# Patient Record
Sex: Female | Born: 1955
Health system: Southern US, Community
[De-identification: ages and names within clinical notes are randomized; demographics above are authoritative.]

## PROBLEM LIST (undated history)

## (undated) DIAGNOSIS — Z21 Asymptomatic human immunodeficiency virus [HIV] infection status: Secondary | ICD-10-CM

## (undated) DIAGNOSIS — K859 Acute pancreatitis without necrosis or infection, unspecified: Secondary | ICD-10-CM

## (undated) DIAGNOSIS — K5792 Diverticulitis of intestine, part unspecified, without perforation or abscess without bleeding: Secondary | ICD-10-CM

## (undated) DIAGNOSIS — F0390 Unspecified dementia without behavioral disturbance: Secondary | ICD-10-CM

## (undated) DIAGNOSIS — I639 Cerebral infarction, unspecified: Secondary | ICD-10-CM

## (undated) DIAGNOSIS — B20 Human immunodeficiency virus [HIV] disease: Secondary | ICD-10-CM

## (undated) DIAGNOSIS — B182 Chronic viral hepatitis C: Secondary | ICD-10-CM

## (undated) DIAGNOSIS — I10 Essential (primary) hypertension: Secondary | ICD-10-CM

## (undated) HISTORY — DX: Chronic viral hepatitis C: B18.2

## (undated) HISTORY — DX: Human immunodeficiency virus (HIV) disease: B20

## (undated) HISTORY — DX: Asymptomatic human immunodeficiency virus (hiv) infection status: Z21

---

## 2000-01-01 ENCOUNTER — Emergency Department (HOSPITAL_COMMUNITY): Admission: EM | Admit: 2000-01-01 | Discharge: 2000-01-01 | Payer: Self-pay | Admitting: Emergency Medicine

## 2000-01-01 ENCOUNTER — Encounter: Payer: Self-pay | Admitting: Emergency Medicine

## 2000-09-21 ENCOUNTER — Encounter: Payer: Self-pay | Admitting: Emergency Medicine

## 2000-09-21 ENCOUNTER — Emergency Department (HOSPITAL_COMMUNITY): Admission: EM | Admit: 2000-09-21 | Discharge: 2000-09-21 | Payer: Self-pay | Admitting: Emergency Medicine

## 2000-10-10 ENCOUNTER — Emergency Department (HOSPITAL_COMMUNITY): Admission: EM | Admit: 2000-10-10 | Discharge: 2000-10-10 | Payer: Self-pay | Admitting: Emergency Medicine

## 2001-08-24 ENCOUNTER — Emergency Department (HOSPITAL_COMMUNITY): Admission: EM | Admit: 2001-08-24 | Discharge: 2001-08-24 | Payer: Self-pay | Admitting: Emergency Medicine

## 2001-08-24 ENCOUNTER — Encounter: Payer: Self-pay | Admitting: Emergency Medicine

## 2002-02-22 ENCOUNTER — Emergency Department (HOSPITAL_COMMUNITY): Admission: EM | Admit: 2002-02-22 | Discharge: 2002-02-22 | Payer: Self-pay | Admitting: Emergency Medicine

## 2002-03-03 ENCOUNTER — Emergency Department (HOSPITAL_COMMUNITY): Admission: EM | Admit: 2002-03-03 | Discharge: 2002-03-03 | Payer: Self-pay

## 2002-03-12 ENCOUNTER — Emergency Department (HOSPITAL_COMMUNITY): Admission: EM | Admit: 2002-03-12 | Discharge: 2002-03-12 | Payer: Self-pay | Admitting: Emergency Medicine

## 2004-11-29 ENCOUNTER — Emergency Department (HOSPITAL_COMMUNITY): Admission: EM | Admit: 2004-11-29 | Discharge: 2004-11-29 | Payer: Self-pay | Admitting: Emergency Medicine

## 2006-06-15 ENCOUNTER — Emergency Department (HOSPITAL_COMMUNITY): Admission: EM | Admit: 2006-06-15 | Discharge: 2006-06-16 | Payer: Self-pay | Admitting: Emergency Medicine

## 2012-04-19 ENCOUNTER — Encounter (HOSPITAL_COMMUNITY): Payer: Self-pay | Admitting: Emergency Medicine

## 2012-04-19 ENCOUNTER — Emergency Department (HOSPITAL_COMMUNITY)
Admission: EM | Admit: 2012-04-19 | Discharge: 2012-04-20 | Disposition: A | Discharge status: Home / Self Care | Payer: Self-pay | Attending: Emergency Medicine | Admitting: Emergency Medicine

## 2012-04-19 DIAGNOSIS — F17200 Nicotine dependence, unspecified, uncomplicated: Secondary | ICD-10-CM | POA: Insufficient documentation

## 2012-04-19 DIAGNOSIS — R10816 Epigastric abdominal tenderness: Secondary | ICD-10-CM | POA: Insufficient documentation

## 2012-04-19 DIAGNOSIS — R110 Nausea: Secondary | ICD-10-CM | POA: Insufficient documentation

## 2012-04-19 DIAGNOSIS — R109 Unspecified abdominal pain: Secondary | ICD-10-CM | POA: Insufficient documentation

## 2012-04-19 DIAGNOSIS — K859 Acute pancreatitis without necrosis or infection, unspecified: Secondary | ICD-10-CM | POA: Insufficient documentation

## 2012-04-19 HISTORY — DX: Acute pancreatitis without necrosis or infection, unspecified: K85.90

## 2012-04-19 LAB — URINALYSIS, ROUTINE W REFLEX MICROSCOPIC
Bilirubin Urine: NEGATIVE
Glucose, UA: NEGATIVE mg/dL
Hgb urine dipstick: NEGATIVE
Ketones, ur: NEGATIVE mg/dL
Nitrite: NEGATIVE
Protein, ur: NEGATIVE mg/dL
Specific Gravity, Urine: 1.016 (ref 1.005–1.030)
Urobilinogen, UA: 0.2 mg/dL (ref 0.0–1.0)
pH: 5.5 (ref 5.0–8.0)

## 2012-04-19 LAB — COMPREHENSIVE METABOLIC PANEL
ALT: 22 U/L (ref 0–35)
AST: 21 U/L (ref 0–37)
Albumin: 3.9 g/dL (ref 3.5–5.2)
Alkaline Phosphatase: 61 U/L (ref 39–117)
BUN: 6 mg/dL (ref 6–23)
CO2: 24 mEq/L (ref 19–32)
Calcium: 10 mg/dL (ref 8.4–10.5)
Chloride: 102 mEq/L (ref 96–112)
Creatinine, Ser: 0.69 mg/dL (ref 0.50–1.10)
GFR calc Af Amer: >90 mL/min (ref >90)
GFR calc non Af Amer: >90 mL/min (ref >90)
Glucose, Bld: 131 mg/dL — ABNORMAL HIGH (ref 70–99)
Potassium: 3.9 mEq/L (ref 3.5–5.1)
Sodium: 138 mEq/L (ref 135–145)
Total Bilirubin: 0.2 mg/dL — ABNORMAL LOW (ref 0.3–1.2)
Total Protein: 7.5 g/dL (ref 6.0–8.3)

## 2012-04-19 LAB — CBC
HCT: 39 % (ref 36.0–46.0)
Hemoglobin: 13.5 g/dL (ref 12.0–15.0)
MCH: 32.8 pg (ref 26.0–34.0)
MCHC: 34.6 g/dL (ref 30.0–36.0)
MCV: 94.9 fL (ref 78.0–100.0)
Platelets: 204 10*3/uL (ref 150–400)
RBC: 4.11 MIL/uL (ref 3.87–5.11)
RDW: 13.6 % (ref 11.5–15.5)
WBC: 7.5 10*3/uL (ref 4.0–10.5)

## 2012-04-19 LAB — URINE MICROSCOPIC-ADD ON

## 2012-04-19 MED ORDER — ONDANSETRON HCL 4 MG/2ML IJ SOLN
4.0000 mg | Freq: Once | INTRAMUSCULAR | Status: AC
  Administered 2012-04-20: 4 mg via INTRAVENOUS
  Filled 2012-04-19: qty 2

## 2012-04-19 MED ORDER — MORPHINE SULFATE 4 MG/ML IJ SOLN
6.0000 mg | Freq: Once | INTRAMUSCULAR | Status: AC
  Administered 2012-04-20: 6 mg via INTRAVENOUS
  Filled 2012-04-19 (×2): qty 1

## 2012-04-19 MED ORDER — SODIUM CHLORIDE 0.9 % IV BOLUS (SEPSIS)
1000.0000 mL | Freq: Once | INTRAVENOUS | Status: AC
Start: 2012-04-19 — End: 2012-04-20
  Administered 2012-04-20: 1000 mL via INTRAVENOUS

## 2012-04-19 MED ORDER — GI COCKTAIL ~~LOC~~
30.0000 mL | Freq: Once | ORAL | Status: AC
  Administered 2012-04-20: 30 mL via ORAL
  Filled 2012-04-19: qty 30

## 2012-04-19 NOTE — ED Notes (Signed)
C/o nausea and upper abd pain that radiates to back (both sides) since Tuesday.

## 2012-04-19 NOTE — ED Notes (Signed)
Pt states she has a history of pancreatitis. Admits to drinking 6 beers a day. Pt states pain feels similar to pancreatitis pain. Having symptoms since Tuesday.

## 2012-04-20 LAB — LIPASE, BLOOD: Lipase: 87 U/L — ABNORMAL HIGH (ref 11–59)

## 2012-04-20 MED ORDER — FAMOTIDINE 20 MG PO TABS: 20.0000 mg | ORAL_TABLET | Freq: Two times a day (BID) | ORAL | Status: DC

## 2012-04-20 MED ORDER — HYDROCODONE-ACETAMINOPHEN 5-325 MG PO TABS: 1.0000 | ORAL_TABLET | Freq: Four times a day (QID) | ORAL | Status: AC | PRN

## 2012-04-20 MED ORDER — PROMETHAZINE HCL 25 MG PO TABS: 25.0000 mg | ORAL_TABLET | Freq: Four times a day (QID) | ORAL | Status: DC | PRN

## 2012-04-20 MED ORDER — METRONIDAZOLE 500 MG PO TABS
2000.0000 mg | ORAL_TABLET | Freq: Once | ORAL | Status: AC
  Administered 2012-04-20: 2000 mg via ORAL
  Filled 2012-04-20: qty 1

## 2012-04-20 NOTE — ED Provider Notes (Signed)
History     CSN: [id number]  Arrival date & time 04/19/12  1940   First MD Initiated Contact with Patient 04/19/12 2137      Chief Complaint  Patient presents with  . Abdominal Pain    (Consider location/radiation/quality/duration/timing/severity/associated sxs/prior treatment) HPI Patient presents emergency Dept. with upper abdominal pain, and nausea since Tuesday.  Patient, states it hurts in the mid upper part of her abdomen.  There is radiation to both sides of her abdomen.  Patient denies chest pain, shortness of breath, vomiting, diarrhea, weakness, dysuria, fever, or syncope.  Patient, states that she has not tried anything at home for her discomfort.  Patient, states that she has had some nausea, but this is intermittent.  Patient, states the palpation seems to make the pain, worse.  Patient is a heavy drinker, states that she drinks 6 beers or more each day. Past Medical History  Diagnosis Date  . Pancreatitis     History reviewed. No pertinent past surgical history.  No family history on file.  History  Substance Use Topics  . Smoking status: Current Everyday Smoker  . Smokeless tobacco: Not on file  . Alcohol Use: Yes    OB History    Grav Para Term Preterm Abortions TAB SAB Ect Mult Living                  Review of Systems All other systems negative except as documented in the HPI. All pertinent positives and negatives as reviewed in the HPI.  Allergies  Review of patient's allergies indicates no known allergies.  Home Medications  No current outpatient prescriptions on file.  BP 196/79  Pulse 73  Temp 98.8 F (37.1 C) (Oral)  Resp 18  SpO2 100%  Physical Exam  Constitutional: She is oriented to person, place, and time. She appears well-developed and well-nourished. No distress.  HENT:  Head: Normocephalic and atraumatic.  Mouth/Throat: Oropharynx is clear and moist.  Cardiovascular: Normal rate and regular rhythm.  Exam reveals no gallop and no  friction rub.   No murmur heard. Pulmonary/Chest: Effort normal and breath sounds normal. No respiratory distress.  Abdominal: Soft. Normal appearance and bowel sounds are normal. She exhibits no distension, no ascites and no mass. There is tenderness in the epigastric area. There is no rigidity, no rebound and no guarding.    Neurological: She is alert and oriented to person, place, and time.  Skin: Skin is warm and dry.    ED Course  Procedures (including critical care time)  Labs Reviewed  URINALYSIS, ROUTINE W REFLEX MICROSCOPIC - Abnormal; Notable for the following:    Leukocytes, UA SMALL (*)     All other components within normal limits  COMPREHENSIVE METABOLIC PANEL - Abnormal; Notable for the following:    Glucose, Bld 131 (*)     Total Bilirubin 0.2 (*)     All other components within normal limits  LIPASE, BLOOD - Abnormal; Notable for the following:    Lipase 87 (*)     All other components within normal limits  CBC  URINE MICROSCOPIC-ADD ON   Patient has no right upper quadrant pain.  She states that her pain is dead center of her epigastrium.  Patient has had no vomiting.  No fever.  Patient's been stable here in the emergency department.  Patient be treated for pancreatitis and will be return here as needed.  Patient will be put on a clear liquid diet for the next 48 hours. The  patients pain is specific to this one area. She is advised to return here for any worsening in her condition.   MDM  MDM Reviewed: nursing note and vitals Reviewed previous: labs Interpretation: labs            Carlyle Dolly, PA-C 04/20/12 0109

## 2012-04-21 NOTE — ED Provider Notes (Signed)
Medical screening examination/treatment/procedure(s) were performed by non-physician practitioner and as supervising physician I was immediately available for consultation/collaboration.    Vida Roller, MD 04/21/12 [phone]

## 2013-02-24 ENCOUNTER — Encounter (HOSPITAL_COMMUNITY): Payer: Self-pay | Admitting: Emergency Medicine

## 2013-02-24 ENCOUNTER — Emergency Department (HOSPITAL_COMMUNITY): Payer: Self-pay

## 2013-02-24 ENCOUNTER — Inpatient Hospital Stay (HOSPITAL_COMMUNITY)
Admission: EM | Admit: 2013-02-24 | Discharge: 2013-02-26 | DRG: 392 | Disposition: A | Discharge status: Home / Self Care | Payer: MEDICAID | Attending: Internal Medicine | Admitting: Internal Medicine

## 2013-02-24 DIAGNOSIS — F1010 Alcohol abuse, uncomplicated: Secondary | ICD-10-CM | POA: Diagnosis present

## 2013-02-24 DIAGNOSIS — F17200 Nicotine dependence, unspecified, uncomplicated: Secondary | ICD-10-CM | POA: Diagnosis present

## 2013-02-24 DIAGNOSIS — K5900 Constipation, unspecified: Secondary | ICD-10-CM | POA: Diagnosis present

## 2013-02-24 DIAGNOSIS — F1210 Cannabis abuse, uncomplicated: Secondary | ICD-10-CM | POA: Diagnosis present

## 2013-02-24 DIAGNOSIS — D7289 Other specified disorders of white blood cells: Secondary | ICD-10-CM | POA: Diagnosis present

## 2013-02-24 DIAGNOSIS — K863 Pseudocyst of pancreas: Secondary | ICD-10-CM | POA: Diagnosis present

## 2013-02-24 DIAGNOSIS — K5792 Diverticulitis of intestine, part unspecified, without perforation or abscess without bleeding: Secondary | ICD-10-CM

## 2013-02-24 DIAGNOSIS — K859 Acute pancreatitis without necrosis or infection, unspecified: Secondary | ICD-10-CM

## 2013-02-24 DIAGNOSIS — K5732 Diverticulitis of large intestine without perforation or abscess without bleeding: Principal | ICD-10-CM

## 2013-02-24 DIAGNOSIS — I10 Essential (primary) hypertension: Secondary | ICD-10-CM

## 2013-02-24 DIAGNOSIS — K862 Cyst of pancreas: Secondary | ICD-10-CM | POA: Diagnosis present

## 2013-02-24 HISTORY — DX: Essential (primary) hypertension: I10

## 2013-02-24 LAB — COMPREHENSIVE METABOLIC PANEL
ALT: 27 U/L (ref 0–35)
Alkaline Phosphatase: 69 U/L (ref 39–117)
BUN: 8 mg/dL (ref 6–23)
Chloride: 104 mEq/L (ref 96–112)
GFR calc Af Amer: >90 mL/min (ref >90)
GFR calc non Af Amer: >90 mL/min (ref >90)
Total Bilirubin: 0.4 mg/dL (ref 0.3–1.2)
Total Protein: 8.1 g/dL (ref 6.0–8.3)

## 2013-02-24 LAB — URINALYSIS, ROUTINE W REFLEX MICROSCOPIC
Hgb urine dipstick: NEGATIVE
Nitrite: NEGATIVE
Protein, ur: NEGATIVE mg/dL
Specific Gravity, Urine: 1.024 (ref 1.005–1.030)
Urobilinogen, UA: 0.2 mg/dL (ref 0.0–1.0)

## 2013-02-24 LAB — CBC WITH DIFFERENTIAL/PLATELET
Basophils Absolute: 0 10*3/uL (ref 0.0–0.1)
Basophils Relative: 0 % (ref 0–1)
Lymphocytes Relative: 16 % (ref 12–46)
MCHC: 35.7 g/dL (ref 30.0–36.0)
Monocytes Absolute: 0.7 10*3/uL (ref 0.1–1.0)
Neutro Abs: 9.1 10*3/uL — ABNORMAL HIGH (ref 1.7–7.7)
Neutrophils Relative %: 78 % — ABNORMAL HIGH (ref 43–77)
Platelets: 225 10*3/uL (ref 150–400)
RDW: 13.6 % (ref 11.5–15.5)
WBC: 11.7 10*3/uL — ABNORMAL HIGH (ref 4.0–10.5)

## 2013-02-24 LAB — PREGNANCY, URINE: Preg Test, Ur: NEGATIVE

## 2013-02-24 LAB — LIPASE, BLOOD: Lipase: 20 U/L (ref 11–59)

## 2013-02-24 MED ORDER — ONDANSETRON HCL 4 MG/2ML IJ SOLN: 4.0000 mg | Freq: Four times a day (QID) | INTRAMUSCULAR | Status: DC | PRN

## 2013-02-24 MED ORDER — ENOXAPARIN SODIUM 40 MG/0.4ML ~~LOC~~ SOLN
40.0000 mg | SUBCUTANEOUS | Status: DC
  Administered 2013-02-24 – 2013-02-25 (×2): 40 mg via SUBCUTANEOUS
  Filled 2013-02-24 (×4): qty 0.4

## 2013-02-24 MED ORDER — SODIUM CHLORIDE 0.9 % IJ SOLN: 3.0000 mL | INTRAMUSCULAR | Status: DC | PRN

## 2013-02-24 MED ORDER — FOLIC ACID 1 MG PO TABS
1.0000 mg | ORAL_TABLET | Freq: Every day | ORAL | Status: DC
  Administered 2013-02-24 – 2013-02-26 (×3): 1 mg via ORAL
  Filled 2013-02-24 (×4): qty 1

## 2013-02-24 MED ORDER — ONDANSETRON HCL 4 MG PO TABS: 4.0000 mg | ORAL_TABLET | Freq: Four times a day (QID) | ORAL | Status: DC | PRN

## 2013-02-24 MED ORDER — THIAMINE HCL 100 MG/ML IJ SOLN
100.0000 mg | Freq: Every day | INTRAMUSCULAR | Status: DC
  Filled 2013-02-24 (×2): qty 1

## 2013-02-24 MED ORDER — METRONIDAZOLE IN NACL 5-0.79 MG/ML-% IV SOLN
500.0000 mg | Freq: Once | INTRAVENOUS | Status: AC
  Administered 2013-02-24: 500 mg via INTRAVENOUS
  Filled 2013-02-24: qty 100

## 2013-02-24 MED ORDER — CIPROFLOXACIN IN D5W 400 MG/200ML IV SOLN
400.0000 mg | Freq: Two times a day (BID) | INTRAVENOUS | Status: DC
  Administered 2013-02-25 – 2013-02-26 (×3): 400 mg via INTRAVENOUS
  Filled 2013-02-24 (×4): qty 200

## 2013-02-24 MED ORDER — SODIUM CHLORIDE 0.9 % IV SOLN: 250.0000 mL | INTRAVENOUS | Status: DC | PRN

## 2013-02-24 MED ORDER — IOHEXOL 300 MG/ML  SOLN: 50.0000 mL | Freq: Once | INTRAMUSCULAR | Status: DC | PRN

## 2013-02-24 MED ORDER — LORAZEPAM 2 MG/ML IJ SOLN: 1.0000 mg | Freq: Four times a day (QID) | INTRAMUSCULAR | Status: DC | PRN

## 2013-02-24 MED ORDER — SODIUM CHLORIDE 0.9 % IJ SOLN
3.0000 mL | Freq: Two times a day (BID) | INTRAMUSCULAR | Status: DC
  Administered 2013-02-24 – 2013-02-25 (×2): 3 mL via INTRAVENOUS

## 2013-02-24 MED ORDER — IOHEXOL 350 MG/ML SOLN: 100.0000 mL | Freq: Once | INTRAVENOUS | Status: DC | PRN

## 2013-02-24 MED ORDER — IOHEXOL 300 MG/ML  SOLN
25.0000 mL | INTRAMUSCULAR | Status: AC
  Administered 2013-02-24: 50 mL via ORAL
  Administered 2013-02-24: 25 mL via ORAL

## 2013-02-24 MED ORDER — MORPHINE SULFATE 2 MG/ML IJ SOLN
2.0000 mg | INTRAMUSCULAR | Status: DC | PRN
  Administered 2013-02-24 – 2013-02-25 (×5): 2 mg via INTRAVENOUS
  Filled 2013-02-24 (×5): qty 1

## 2013-02-24 MED ORDER — IOHEXOL 300 MG/ML  SOLN
100.0000 mL | Freq: Once | INTRAMUSCULAR | Status: AC | PRN
  Administered 2013-02-24: 100 mL via INTRAVENOUS

## 2013-02-24 MED ORDER — METRONIDAZOLE IN NACL 5-0.79 MG/ML-% IV SOLN
500.0000 mg | Freq: Three times a day (TID) | INTRAVENOUS | Status: DC
  Administered 2013-02-24 – 2013-02-26 (×5): 500 mg via INTRAVENOUS
  Filled 2013-02-24 (×7): qty 100

## 2013-02-24 MED ORDER — SODIUM CHLORIDE 0.9 % IV SOLN
INTRAVENOUS | Status: DC
  Administered 2013-02-24 – 2013-02-25 (×4): via INTRAVENOUS

## 2013-02-24 MED ORDER — MORPHINE SULFATE 4 MG/ML IJ SOLN
4.0000 mg | Freq: Once | INTRAMUSCULAR | Status: AC
  Administered 2013-02-24: 4 mg via INTRAVENOUS
  Filled 2013-02-24 (×2): qty 1

## 2013-02-24 MED ORDER — VITAMIN B-1 100 MG PO TABS
100.0000 mg | ORAL_TABLET | Freq: Every day | ORAL | Status: DC
  Administered 2013-02-24 – 2013-02-26 (×3): 100 mg via ORAL
  Filled 2013-02-24 (×4): qty 1

## 2013-02-24 MED ORDER — ADULT MULTIVITAMIN W/MINERALS CH
1.0000 | ORAL_TABLET | Freq: Every day | ORAL | Status: DC
  Administered 2013-02-24 – 2013-02-26 (×3): 1 via ORAL
  Filled 2013-02-24 (×4): qty 1

## 2013-02-24 MED ORDER — NICOTINE 14 MG/24HR TD PT24
14.0000 mg | MEDICATED_PATCH | Freq: Every day | TRANSDERMAL | Status: DC
  Administered 2013-02-25 – 2013-02-26 (×2): 14 mg via TRANSDERMAL
  Filled 2013-02-24 (×4): qty 1

## 2013-02-24 MED ORDER — LORAZEPAM 1 MG PO TABS: 1.0000 mg | ORAL_TABLET | Freq: Four times a day (QID) | ORAL | Status: DC | PRN

## 2013-02-24 MED ORDER — CIPROFLOXACIN IN D5W 400 MG/200ML IV SOLN
400.0000 mg | Freq: Once | INTRAVENOUS | Status: AC
  Administered 2013-02-24: 400 mg via INTRAVENOUS
  Filled 2013-02-24: qty 200

## 2013-02-24 NOTE — ED Provider Notes (Signed)
Complaint of lower abdominal pain onset 4 days ago. Denies fever denies vomiting.  Doug Sou, MD 02/24/13 1425

## 2013-02-24 NOTE — ED Notes (Signed)
Pt c/o lower abd pain x 5 days; pt denies UTI sx of vaginal discharge

## 2013-02-24 NOTE — Progress Notes (Signed)
ANTIBIOTIC CONSULT NOTE - INITIAL  Pharmacy Consult for cipro + flagyl Indication: diverticulitis  No Known Allergies  Patient Measurements: Height: 5\' 6"  (167.6 cm) Weight: 130 lb (58.968 kg) IBW/kg (Calculated) : 59.3  Vital Signs: Temp: 98.8 F (37.1 C) (05/12 1445) Temp src: Oral (05/12 1445) BP: 165/73 mmHg (05/12 1515) Pulse Rate: 94 (05/12 1515) Intake/Output from previous day:   Intake/Output from this shift:    Labs:  Recent Labs  02/24/13 0946  WBC 11.7*  HGB 14.7  PLT 225  CREATININE 0.73   Estimated Creatinine Clearance: 73.1 ml/min (by C-G formula based on Cr of 0.73). No results found for this basename: VANCOTROUGH, VANCOPEAK, VANCORANDOM, GENTTROUGH, GENTPEAK, GENTRANDOM, TOBRATROUGH, TOBRAPEAK, TOBRARND, AMIKACINPEAK, AMIKACINTROU, AMIKACIN,  in the last 72 hours   Microbiology: No results found for this or any previous visit (from the past 720 hour(s)).  Medical History: Past Medical History  Diagnosis Date  . Pancreatitis   . Hypertension     not on medications    Medications:  Anti-infectives   Start     Dose/Rate Route Frequency Ordered Stop   02/24/13 1345  ciprofloxacin (CIPRO) IVPB 400 mg     400 mg 200 mL/hr over 60 Minutes Intravenous  Once 02/24/13 1336 02/24/13 1454   02/24/13 1345  metroNIDAZOLE (FLAGYL) IVPB 500 mg     500 mg 100 mL/hr over 60 Minutes Intravenous  Once 02/24/13 1336       Assessment: 56 yof presented to the ED with abdominal pain. CT demonstrated diverticulitis. Pt to start flagyl and cipro. Currently afebrile and WBC is mildly elevated. Doses are already ordered by MD and they are appropriate for renal function.   Cipro 5/12>> Flagyl 5/12>>  Goal of Therapy:  Eradication of infection  Plan:  1. Continue flagyl 500mg  IV Q8H as ordered by MD 2. Continue cipro 400mg  IV Q12H as ordered by MD 3. F/u renal fxn, C&S, clinical status  Bennette Hasty, Drake Leach 02/24/2013,3:43 PM

## 2013-02-24 NOTE — ED Provider Notes (Signed)
History     CSN: [id number]  Arrival date & time 02/24/13  9528   First MD Initiated Contact with Patient 02/24/13 1021      Chief Complaint  Patient presents with  . Abdominal Pain    (Consider location/radiation/quality/duration/timing/severity/associated sxs/prior treatment) HPI Comments: Thank you patient presents to the emergency department with chief complaint of abdominal pain. She states that she's been having the pain for the past 5 days. She endorses some associated nausea and vomiting. She denies any diarrhea. States that she has had some constipation. She is tried taking Epsom salts for this with a no relief. She denies any dysuria, or vaginal discharge. She states the pain does not radiate. The pain is localized to her right lower quadrant. She states the pain is intermittent, and that right now she is not having any pain.  The history is provided by the patient. No language interpreter was used.    Past Medical History  Diagnosis Date  . Pancreatitis     History reviewed. No pertinent past surgical history.  History reviewed. No pertinent family history.  History  Substance Use Topics  . Smoking status: Current Every Day Smoker  . Smokeless tobacco: Not on file  . Alcohol Use: Yes    OB History   Grav Para Term Preterm Abortions TAB SAB Ect Mult Living                  Review of Systems  All other systems reviewed and are negative.    Allergies  Review of patient's allergies indicates no known allergies.  Home Medications  No current outpatient prescriptions on file.  BP 152/73  Pulse 106  Temp(Src) 99 F (37.2 C) (Oral)  Resp 18  Ht 5\' 6"  (1.676 m)  Wt 130 lb (58.968 kg)  BMI 20.99 kg/m2  SpO2 99%  Physical Exam  Nursing note and vitals reviewed. Constitutional: She is oriented to person, place, and time. She appears well-developed and well-nourished.  HENT:  Head: Normocephalic and atraumatic.  Eyes: Conjunctivae and EOM are normal.  Pupils are equal, round, and reactive to light.  Neck: Normal range of motion. Neck supple.  Cardiovascular: Normal rate and regular rhythm.  Exam reveals no gallop and no friction rub.   No murmur heard. Pulmonary/Chest: Effort normal and breath sounds normal. No respiratory distress. She has no wheezes. She has no rales. She exhibits no tenderness.  Abdominal: Soft. Bowel sounds are normal. She exhibits no distension and no mass. There is tenderness. There is no rebound and no guarding.  Abdominal tenderness to the right lower quadrant with rebound tenderness, and pain on palpation of suprapubic region, no left lower quadrant tenderness, no right upper quadrant tenderness or Murphy's sign, no fluid wave or signs of peritonitis  Musculoskeletal: Normal range of motion. She exhibits no edema and no tenderness.  Neurological: She is alert and oriented to person, place, and time.  Skin: Skin is warm and dry.  Psychiatric: She has a normal mood and affect. Her behavior is normal. Judgment and thought content normal.    ED Course  Procedures (including critical care time)  Labs Reviewed  COMPREHENSIVE METABOLIC PANEL - Abnormal; Notable for the following:    Glucose, Bld 124 (*)    All other components within normal limits  CBC WITH DIFFERENTIAL - Abnormal; Notable for the following:    WBC 11.7 (*)    Neutrophils Relative 78 (*)    Neutro Abs 9.1 (*)    All  other components within normal limits  URINALYSIS, ROUTINE W REFLEX MICROSCOPIC - Abnormal; Notable for the following:    Bilirubin Urine SMALL (*)    All other components within normal limits  LIPASE, BLOOD   Results for orders placed during the hospital encounter of 02/24/13  COMPREHENSIVE METABOLIC PANEL      Result Value Range   Sodium 136  135 - 145 mEq/L   Potassium 4.0  3.5 - 5.1 mEq/L   Chloride 104  96 - 112 mEq/L   CO2 20  19 - 32 mEq/L   Glucose, Bld 124 (*) 70 - 99 mg/dL   BUN 8  6 - 23 mg/dL   Creatinine, Ser 1.61   0.50 - 1.10 mg/dL   Calcium 9.6  8.4 - 09.6 mg/dL   Total Protein 8.1  6.0 - 8.3 g/dL   Albumin 4.1  3.5 - 5.2 g/dL   AST 22  0 - 37 U/L   ALT 27  0 - 35 U/L   Alkaline Phosphatase 69  39 - 117 U/L   Total Bilirubin 0.4  0.3 - 1.2 mg/dL   GFR calc non Af Amer >90  >90 mL/min   GFR calc Af Amer >90  >90 mL/min  CBC WITH DIFFERENTIAL      Result Value Range   WBC 11.7 (*) 4.0 - 10.5 K/uL   RBC 4.46  3.87 - 5.11 MIL/uL   Hemoglobin 14.7  12.0 - 15.0 g/dL   HCT 04.5  40.9 - 81.1 %   MCV 92.4  78.0 - 100.0 fL   MCH 33.0  26.0 - 34.0 pg   MCHC 35.7  30.0 - 36.0 g/dL   RDW 91.4  78.2 - 95.6 %   Platelets 225  150 - 400 K/uL   Neutrophils Relative 78 (*) 43 - 77 %   Neutro Abs 9.1 (*) 1.7 - 7.7 K/uL   Lymphocytes Relative 16  12 - 46 %   Lymphs Abs 1.9  0.7 - 4.0 K/uL   Monocytes Relative 6  3 - 12 %   Monocytes Absolute 0.7  0.1 - 1.0 K/uL   Eosinophils Relative 0  0 - 5 %   Eosinophils Absolute 0.1  0.0 - 0.7 K/uL   Basophils Relative 0  0 - 1 %   Basophils Absolute 0.0  0.0 - 0.1 K/uL  LIPASE, BLOOD      Result Value Range   Lipase 20  11 - 59 U/L  URINALYSIS, ROUTINE W REFLEX MICROSCOPIC      Result Value Range   Color, Urine YELLOW  YELLOW   APPearance CLEAR  CLEAR   Specific Gravity, Urine 1.024  1.005 - 1.030   pH 5.0  5.0 - 8.0   Glucose, UA NEGATIVE  NEGATIVE mg/dL   Hgb urine dipstick NEGATIVE  NEGATIVE   Bilirubin Urine SMALL (*) NEGATIVE   Ketones, ur NEGATIVE  NEGATIVE mg/dL   Protein, ur NEGATIVE  NEGATIVE mg/dL   Urobilinogen, UA 0.2  0.0 - 1.0 mg/dL   Nitrite NEGATIVE  NEGATIVE   Leukocytes, UA NEGATIVE  NEGATIVE  PREGNANCY, URINE      Result Value Range   Preg Test, Ur NEGATIVE  NEGATIVE   Ct Abdomen Pelvis W Contrast  02/24/2013  **ADDENDUM** CREATED: 02/24/2013 13:54:16  In addition, there should be an impression #4 that reads: " 4.  Advanced atherosclerosis.  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 "  **END ADDENDUM** SIGNED  BY: Cindie Crumbly  Dorina Hoyer, M.D.   02/24/2013  *RADIOLOGY REPORT*  Clinical Data: 57 year old female with abdominal pain.  Right lower quadrant pain.  History pancreatitis.  CT ABDOMEN AND PELVIS WITH CONTRAST  Technique:  Multidetector CT imaging of the abdomen and pelvis was performed following the standard protocol during bolus administration of intravenous contrast.  Contrast: OMNIPAQUE IOHEXOL 300 MG/ML  SOLN  Comparison: None.  Findings: Mild atelectasis at both lung bases.  No pericardial or pleural effusion.  Incidental lower thoracic or upper lumbar spina bifida occulta. No acute osseous abnormality identified.  Chronic right pubic rami fractures.   Mild rectal wall thickening and hyperenhancement.  No pelvic free fluid.  The sigmoid colon is very abnormal along a 89-10 cm segment, with a lobulated area of wall thickening up to 22 mm.  The sigmoid wall is body, edematous, and indistinct.  There is surrounding sigmoid mesenteric inflammation.  The lumen is obliterated.  Primarily upstream of the inflammatory changes sigmoid diverticulosis is noted.  No pneumoperitoneum.  No definite extraluminal gas.  No associated drainable fluid collection.  The inflammatory changes are inseparable from the uterus and adnexa.  Superimposed fibroid at the uterine fundus is noted.  No pelvic sidewall lymphadenopathy.  No lower abdominal retroperitoneal lymphadenopathy.  Diverticulosis in the distal descending colon without active inflammation.  Oral contrast has reached the transverse colon. Right colon and appendix are normal.  Some small bowel loops measure at the upper limits of normal.  No strong evidence of bowel obstruction.  Stomach and duodenum within normal limits.  Liver, gallbladder, spleen, and adrenal glands are within normal limits.  There is a 11 mm round or oval cystic lesion in the tail of the pancreas abutting the splenic hilum (series 2 image 23, coronal image 47).  This appears to be fairly unilocular.   The remaining pancreatic parenchyma is within normal limits.  Portal venous system and splenic vein are patent.  Advanced atherosclerosis throughout the abdomen pelvis.  Major arterial structures in the abdomen and pelvis remain patent.  However, the left superficial femoral artery is occluded at its origin (series 2 image 91).  IMPRESSION:  1.  Severely abnormal sigmoid colon along a 9-10 cm segment.  Favor severe colitis/diverticulitis, however, follow-up is recommended to exclude  a sigmoid neoplasm which can have a similar appearance and presentation. 2.  No associated abscess or pneumoperitoneum identified. 3.  Small 11 mm cystic lesion at the tail of the pancreas near the splenic hilum.  This might be related to the patient's reported history of pancreatitis, but recommend a followup study in 1 year (preferably an abdominal MRI with pancreas protocol without and with contrast). This recommendation follows ACR consensus guidelines:  Managing Incidental Findings on Abdominal CT:  White Paper of the ACR Incidental Findings Committee.  J Am Coll Radiol 1610;9:604-540  Original Report Authenticated By: Erskine Speed, M.D.       1. Diverticulitis       MDM  Patient was right lower quadrant abdominal pain. Check basic labs. Will likely order a CT abdomen pelvis.  CT shows diverticulitis of sigmoid colon. CT also shows concern for neoplasm of the colon, as well as a cystic lesion of the pancreas. Will treat with cipro/flagyl in the ED.   2:25 PM I have discussed the patient with Dr. Loreta Ave, from gastroenterology, who tells me that I need to have the patient admitted, so that she can have a colonoscopy in the hospital as she does not have insurance or  good follow-up.  Discussed this with Dr. Ethelda Chick, who says that will be fine.     Roxy Horseman, PA-C 02/24/13 1622

## 2013-02-24 NOTE — H&P (Signed)
Hospital Admission Note Date: 02/24/2013  Patient name: Lindsey Leon Medical record number: [id number] Date of birth: 1956/08/15 Age: 57 y.o. Gender: female PCP: Default, Provider, MD  Medical Service: IMTS  Attending physician:  Dr. Eben Burow    1st Contact:  Dr. Burtis Junes  Pager: [phone] 2nd Contact:  Dr. Dierdre Searles   Pager: [phone] After 5 pm or weekends: 1st Contact:      Pager: [phone] 2nd Contact:      Pager: [phone]  Chief Complaint: abdominal pain  History of Present Illness: Lindsey Leon is a 57 yo AA woman no significant pmh and lost to the medical system as has not seen a physician in many years. Patient states that abdominal pain started about 4 days and then gradually became worse until she presented. Pt has had a remote hx of pancreatitis in the past. Pt denied any diarrhea but had some constipation and bloating that was relieved by epsom salts. She didn't notice any mucus, hematochezia, or frank blood. Pt denied having alternating bowel habits or change in character of her BMs. She is a daily EtOH drinker "with 240s" and then on the weekend she drinks "a couple of 42s with a friend." therefore pt states her last drink was the day before admission. She only had one episode of vomiting w/o hematemesis. She has not withdrawn from EtOH before and has had tried outpt management but states "i just like it." she also smokes cigarettes but denied any other illicit drugs. She denied having any fevers/chills, nausea, diarrhea, sick contacts, or recent travel. Pt now lives independently and works at [employer]. She is not currently sexually active.   Meds: No current outpatient prescriptions on file.  Allergies: Allergies as of 02/24/2013  . (No Known Allergies)   Past Medical History  Diagnosis Date  . Pancreatitis   . Hypertension     not on medications   History reviewed. No pertinent past surgical history. Family History  Problem Relation Age of Onset  . Cancer Mother     passed  away. unknown age  . Healthy Father   . Hypertension Sister   . Diabetes Mellitus II Sister    History   Social History  . Marital Status: Single    Spouse Name: N/A    Number of Children: N/A  . Years of Education: N/A   Occupational History  . Not on file.   Social History Main Topics  . Smoking status: Current Every Day Smoker -- 1.00 packs/day for 30 years    Types: Cigarettes  . Smokeless tobacco: Current User    Types: Chew  . Alcohol Use: Yes     Comment: drink 48 oz daily at weeak days and 80 oz daily on weekend. last drink on 02/23/13  . Drug Use: Yes    Special: Marijuana  . Sexually Active: Not on file   Other Topics Concern  . Not on file   Social History Narrative   She lives by herself at home. She works at [employer]. She does not have insurance.     Review of Systems: Pertinent items are noted in HPI.  Physical Exam: Blood pressure 165/73, pulse 94, temperature 98.8 F (37.1 C), temperature source Oral, resp. rate 16, height 5\' 6"  (1.676 m), weight 130 lb (58.968 kg), SpO2 99.00%. General: resting in bed, NAD HEENT: PERRL, EOMI, no scleral icterus, MMM, poor dentition Cardiac: RRR, no rubs, murmurs or gallops Pulm: clear to auscultation bilaterally, moving normal volumes of air Abd: soft,  protuberant, ttp RLQ and suprapubic area, no rebound, no guarding, nondistended, BS present Ext: warm and well perfused, no pedal edema Neuro: alert and oriented X3, cranial nerves II-XII grossly intact  Lab results: Basic Metabolic Panel:  Recent Labs  30/86/57 0946  NA 136  K 4.0  CL 104  CO2 20  GLUCOSE 124*  BUN 8  CREATININE 0.73  CALCIUM 9.6   Liver Function Tests:  Recent Labs  02/24/13 0946  AST 22  ALT 27  ALKPHOS 69  BILITOT 0.4  PROT 8.1  ALBUMIN 4.1    Recent Labs  02/24/13 0946  LIPASE 20   CBC:  Recent Labs  02/24/13 0946  WBC 11.7*  NEUTROABS 9.1*  HGB 14.7  HCT 41.2  MCV 92.4  PLT 225    Urinalysis:  Recent Labs  02/24/13 0945  COLORURINE YELLOW  LABSPEC 1.024  PHURINE 5.0  GLUCOSEU NEGATIVE  HGBUR NEGATIVE  BILIRUBINUR SMALL*  KETONESUR NEGATIVE  PROTEINUR NEGATIVE  UROBILINOGEN 0.2  NITRITE NEGATIVE  LEUKOCYTESUR NEGATIVE   Imaging results:  Ct Abdomen Pelvis W Contrast  02/24/2013  **ADDENDUM** CREATED: 02/24/2013 13:54:16  In addition, there should be an impression #4 that reads: " 4.  Advanced atherosclerosis.  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 "  **END ADDENDUM** SIGNED BY: Harley Hallmark, M.D.   02/24/2013  *RADIOLOGY REPORT*  Clinical Data: 57 year old female with abdominal pain.  Right lower quadrant pain.  History pancreatitis.  CT ABDOMEN AND PELVIS WITH CONTRAST  Technique:  Multidetector CT imaging of the abdomen and pelvis was performed following the standard protocol during bolus administration of intravenous contrast.  Contrast: OMNIPAQUE IOHEXOL 300 MG/ML  SOLN  Comparison: None.  Findings: Mild atelectasis at both lung bases.  No pericardial or pleural effusion.  Incidental lower thoracic or upper lumbar spina bifida occulta. No acute osseous abnormality identified.  Chronic right pubic rami fractures.   Mild rectal wall thickening and hyperenhancement.  No pelvic free fluid.  The sigmoid colon is very abnormal along a 89-10 cm segment, with a lobulated area of wall thickening up to 22 mm.  The sigmoid wall is body, edematous, and indistinct.  There is surrounding sigmoid mesenteric inflammation.  The lumen is obliterated.  Primarily upstream of the inflammatory changes sigmoid diverticulosis is noted.  No pneumoperitoneum.  No definite extraluminal gas.  No associated drainable fluid collection.  The inflammatory changes are inseparable from the uterus and adnexa.  Superimposed fibroid at the uterine fundus is noted.  No pelvic sidewall lymphadenopathy.  No lower abdominal retroperitoneal lymphadenopathy.   Diverticulosis in the distal descending colon without active inflammation.  Oral contrast has reached the transverse colon. Right colon and appendix are normal.  Some small bowel loops measure at the upper limits of normal.  No strong evidence of bowel obstruction.  Stomach and duodenum within normal limits.  Liver, gallbladder, spleen, and adrenal glands are within normal limits.  There is a 11 mm round or oval cystic lesion in the tail of the pancreas abutting the splenic hilum (series 2 image 23, coronal image 47).  This appears to be fairly unilocular.  The remaining pancreatic parenchyma is within normal limits.  Portal venous system and splenic vein are patent.  Advanced atherosclerosis throughout the abdomen pelvis.  Major arterial structures in the abdomen and pelvis remain patent.  However, the left superficial femoral artery is occluded at its origin (series 2 image 91).  IMPRESSION:  1.  Severely abnormal sigmoid colon  along a 9-10 cm segment.  Favor severe colitis/diverticulitis, however, follow-up is recommended to exclude  a sigmoid neoplasm which can have a similar appearance and presentation. 2.  No associated abscess or pneumoperitoneum identified. 3.  Small 11 mm cystic lesion at the tail of the pancreas near the splenic hilum.  This might be related to the patient's reported history of pancreatitis, but recommend a followup study in 1 year (preferably an abdominal MRI with pancreas protocol without and with contrast). This recommendation follows ACR consensus guidelines:  Managing Incidental Findings on Abdominal CT:  White Paper of the ACR Incidental Findings Committee.  J Am Coll Radiol 1610;9:604-540  Original Report Authenticated By: Erskine Speed, M.D.     Assessment & Plan by Problem: 1. Diverticulitis: Pt doesn't meet SIRS criteria at this time but slight neutrophil leukocytosis (11.7). In the ED pt had abdominal CT that showed concerning inflammation around sigmoid colon favoring  diverticulitis but not excluded malignancy. GI was consulted by ED physician to evaluate. Pt normal lipase (20). -NPO -IV cipro and flagyl -IVF NS 125cc/hr  -appreciate GI recs- plan to do colonoscopy during admission -bmet and cbc in AM  2. Alcohol abuse: Pt describes significant acute increase in EtOH ingestion along with daily EtOH. Pt denied ever having been admitted for withdrawal or had significant w/d symptoms in the past.  -CIWA -CSW consult for possible outpt programs, Alcohol Anonymous   Dispo: Disposition is deferred at this time, awaiting improvement of current medical problems. Anticipated discharge in approximately 1-2 day(s).   The patient does not have a current PCP (Default, Provider, MD), therefore will not be requiring OPC follow-up after discharge.   The patient does not have transportation limitations that hinder transportation to clinic appointments.  Signed: Christen Bame, MD PGY-1 Pgr: [phone] 02/24/2013, 4:02 PM

## 2013-02-24 NOTE — ED Notes (Addendum)
Nurse greg- notified of elevated blood pressure.

## 2013-02-24 NOTE — ED Provider Notes (Signed)
Medical screening examination/treatment/procedure(s) were conducted as a shared visit with non-physician practitioner(s) and myself.  I personally evaluated the patient during the encounter  Doug Sou, MD 02/24/13 1725

## 2013-02-25 LAB — CBC
HCT: 36.4 % (ref 36.0–46.0)
MCHC: 34.3 g/dL (ref 30.0–36.0)
Platelets: 178 10*3/uL (ref 150–400)
RDW: 13.6 % (ref 11.5–15.5)
WBC: 6.6 10*3/uL (ref 4.0–10.5)

## 2013-02-25 LAB — BASIC METABOLIC PANEL
BUN: 5 mg/dL — ABNORMAL LOW (ref 6–23)
Calcium: 8.8 mg/dL (ref 8.4–10.5)
Chloride: 104 mEq/L (ref 96–112)
Creatinine, Ser: 0.66 mg/dL (ref 0.50–1.10)
GFR calc Af Amer: >90 mL/min (ref >90)
GFR calc non Af Amer: >90 mL/min (ref >90)

## 2013-02-25 MED ORDER — SODIUM CHLORIDE 0.9 % IV SOLN: INTRAVENOUS | Status: DC

## 2013-02-25 MED ORDER — FLEET ENEMA 7-19 GM/118ML RE ENEM
2.0000 | ENEMA | Freq: Once | RECTAL | Status: AC
  Administered 2013-02-26: 2 via RECTAL
  Filled 2013-02-25: qty 2

## 2013-02-25 NOTE — Consult Note (Signed)
Reason for Consult:  Diverticulitis Referring Physician: Teaching Service  Lindsey Leon HPI: This is a 57 year old female who was admitted for abdominal pain.  Further work up revealed a severe diverticulitis, however, a sigmoid colon mass was not able to be excluded.  Additionally the CT scan revealed an 11 mm cyst in the tail of the pancreas.  She has a history of ETOH abuse and a reported history of pancreatitis.  Approximately 8 months ago she had a minor bout of hematochezia.  No prior colonoscopy.  She denies any known family history of colon cancer or any problems with diarrhea or melena.  Constipation appears to be a problem of late.  Past Medical History  Diagnosis Date  . Pancreatitis   . Hypertension     not on medications    History reviewed. No pertinent past surgical history.  Family History  Problem Relation Age of Onset  . Cancer Mother     passed away. unknown age  . Healthy Father   . Hypertension Sister   . Diabetes Mellitus II Sister     Social History:  reports that she has been smoking Cigarettes.  She has a 30 pack-year smoking history. Her smokeless tobacco use includes Chew. She reports that  drinks alcohol. She reports that she uses illicit drugs (Marijuana).  Allergies: No Known Allergies  Medications:  Scheduled: . ciprofloxacin  400 mg Intravenous Q12H  . enoxaparin (LOVENOX) injection  40 mg Subcutaneous Q24H  . folic acid  1 mg Oral Daily  . metronidazole  500 mg Intravenous Q8H  . multivitamin with minerals  1 tablet Oral Daily  . nicotine  14 mg Transdermal Daily  . sodium chloride  3 mL Intravenous Q12H  . thiamine  100 mg Oral Daily   Or  . thiamine  100 mg Intravenous Daily   Continuous: . sodium chloride 75 mL/hr at 02/25/13 1033    Results for orders placed during the hospital encounter of 02/24/13 (from the past 24 hour(s))  CBC     Status: None   Collection Time    02/25/13  9:15 AM      Result Value Range   WBC 6.6  4.0 -  10.5 K/uL   RBC 3.88  3.87 - 5.11 MIL/uL   Hemoglobin 12.5  12.0 - 15.0 g/dL   HCT 36.4  36.0 - 46.0 %   MCV 93.8  78.0 - 100.0 fL   MCH 32.2  26.0 - 34.0 pg   MCHC 34.3  30.0 - 36.0 g/dL   RDW 13.6  11.5 - 15.5 %   Platelets 178  150 - 400 K/uL     Ct Abdomen Pelvis W Contrast  02/24/2013  **ADDENDUM** CREATED: 02/24/2013 13:54:16  In addition, there should be an impression #4 that reads: " 4.  Advanced atherosclerosis.  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 "  **END ADDENDUM** SIGNED BY: H. Lee Hall III, M.D.   02/24/2013  *RADIOLOGY REPORT*  Clinical Data: 57-year-old female with abdominal pain.  Right lower quadrant pain.  History pancreatitis.  CT ABDOMEN AND PELVIS WITH CONTRAST  Technique:  Multidetector CT imaging of the abdomen and pelvis was performed following the standard protocol during bolus administration of intravenous contrast.  Contrast: 100mL OMNIPAQUE IOHEXOL 300 MG/ML  SOLN  Comparison: None.  Findings: Mild atelectasis at both lung bases.  No pericardial or pleural effusion.  Incidental lower thoracic or upper lumbar spina bifida occulta. No acute osseous abnormality identified.    Chronic right pubic rami fractures.   Mild rectal wall thickening and hyperenhancement.  No pelvic free fluid.  The sigmoid colon is very abnormal along a 89-10 cm segment, with a lobulated area of wall thickening up to 22 mm.  The sigmoid wall is body, edematous, and indistinct.  There is surrounding sigmoid mesenteric inflammation.  The lumen is obliterated.  Primarily upstream of the inflammatory changes sigmoid diverticulosis is noted.  No pneumoperitoneum.  No definite extraluminal gas.  No associated drainable fluid collection.  The inflammatory changes are inseparable from the uterus and adnexa.  Superimposed fibroid at the uterine fundus is noted.  No pelvic sidewall lymphadenopathy.  No lower abdominal retroperitoneal lymphadenopathy.  Diverticulosis in the distal  descending colon without active inflammation.  Oral contrast has reached the transverse colon. Right colon and appendix are normal.  Some small bowel loops measure at the upper limits of normal.  No strong evidence of bowel obstruction.  Stomach and duodenum within normal limits.  Liver, gallbladder, spleen, and adrenal glands are within normal limits.  There is a 11 mm round or oval cystic lesion in the tail of the pancreas abutting the splenic hilum (series 2 image 23, coronal image 47).  This appears to be fairly unilocular.  The remaining pancreatic parenchyma is within normal limits.  Portal venous system and splenic vein are patent.  Advanced atherosclerosis throughout the abdomen pelvis.  Major arterial structures in the abdomen and pelvis remain patent.  However, the left superficial femoral artery is occluded at its origin (series 2 image 91).  IMPRESSION:  1.  Severely abnormal sigmoid colon along a 9-10 cm segment.  Favor severe colitis/diverticulitis, however, follow-up is recommended to exclude  a sigmoid neoplasm which can have a similar appearance and presentation. 2.  No associated abscess or pneumoperitoneum identified. 3.  Small 11 mm cystic lesion at the tail of the pancreas near the splenic hilum.  This might be related to the patient's reported history of pancreatitis, but recommend a followup study in 1 year (preferably an abdominal MRI with pancreas protocol without and with contrast). This recommendation follows ACR consensus guidelines:  Managing Incidental Findings on Abdominal CT:  White Paper of the ACR Incidental Findings Committee.  J Am Coll Radiol 2010;7:754-773  Original Report Authenticated By: H. Hall III, M.D.     ROS:  As stated above in the HPI otherwise negative.  Blood pressure 152/60, pulse 81, temperature 97.9 F (36.6 C), temperature source Oral, resp. rate 20, height 5' 6" (1.676 m), weight 141 lb 12.1 oz (64.3 kg), SpO2 96.00%.    PE: Gen: NAD, Alert and  Oriented HEENT:  [place]/AT, EOMI Neck: Supple, no LAD Lungs: CTA Bilaterally CV: RRR without M/G/R ABM: Soft, NTND, +BS Ext: No C/C/E  Assessment/Plan: 1) Diverticulitis. 2) Cystic lesion in the tail of the pancreas.   The CT scan is suggestive of a diverticulitis, however, it will be prudent to evaluate her with a FFS to rule out any evidence of a neoplasm in the sigmoid colon.  Preferably it is best to wait 4-6 weeks before an endoscopic evaluation, but I think with minimal insufflation she should be able to undergo the procedure with out any problems.  It is best to perform the work up now as she does not appear to be reliable with follow up medical care.  As for her tail of the pancreas cyst, she needs to have a repeat scan in one year.  As recommended by Radiology, she will require   an MRI.  Plan: 1) FFS tomorrow. 2) If the FFS is negative for a mass she will require a colonoscopy as an out patient as she is older than 50 for routine screening purposes. 3) MRI in one year.  Jeanne Terrance D 02/25/2013, 2:04 PM      

## 2013-02-25 NOTE — H&P (Signed)
Internal Medicine Attending Admission Note Date: 02/25/2013  Patient name: Lindsey Leon Medical record number: [id number] Date of birth: 04/27/1956 Age: 57 y.o. Gender: female  I saw and evaluated the patient. I reviewed the resident's note and I agree with the resident's findings and plan as documented in the resident's note.  Chief Complaint(s): Abdominal pain  History - key components related to admission: Patient is a 58 year old female with past medical history significant for ongoing alcohol abuse, past admission for pancreatitis and hypertension who has not seen a physician in many years comes in with about 4 days of abdominal pain. Patient complained of 6/10 abdominal pain which has progressively been getting worse for last 4 days. Pain is present mostly in the left lower abdomen and without radiation, no exacerbating or relieving factors. Patient has noticed some constipation since Thursday which was relieved on Sunday with Epsom salt. Since Sunday that is 2 days prior to admission patient has been having loose bowel movements. Patient has not seen any blood with her stools. Patient had one episode of vomiting without any blood on Thursday that is 5 days prior to admission. She denies any fevers, chills, nausea, diarrhea, change in urinary habits.   15 point review of systems is negative except for as noted above.  Past medical history, past surgical history, medications, social history and family history was noted as per resident's note.  Physical Exam - key components related to admission:  Filed Vitals:   02/24/13 1645 02/24/13 2117 02/25/13 0500 02/25/13 0507  BP: 139/79 153/72  158/69  Pulse: 96 91  86  Temp: 99.9 F (37.7 C) 99.9 F (37.7 C)  98.5 F (36.9 C)  TempSrc: Oral Oral  Oral  Resp: 18 18  16   Height: 5\' 6"  (1.676 m)     Weight: 133 lb 9.6 oz (60.6 kg)  141 lb 12.1 oz (64.3 kg)   SpO2: 98% 97%  100%  Physical Exam: General: Vital signs reviewed and noted.  Well-developed, well-nourished, in no acute distress; alert, appropriate and cooperative throughout examination.  Head: Normocephalic, atraumatic.  Eyes: PERRL, EOMI, No signs of anemia or jaundince.  Nose: Mucous membranes moist, not inflammed, nonerythematous.  Throat: Oropharynx nonerythematous, no exudate appreciated.   Neck: No deformities, masses, or tenderness noted.Supple, No carotid Bruits, no JVD.  Lungs:  Normal respiratory effort. Clear to auscultation BL without crackles or wheezes.  Heart: RRR. S1 and S2 normal without gallop, murmur, or rubs.  Abdomen:  BS normoactive. Soft, Nondistended, non-tender.  No masses or organomegaly.  Extremities: No pretibial edema.  Neurologic: A&O X3, CN II - XII are grossly intact. Motor strength is 5/5 in the all 4 extremities, Sensations intact to light touch, Cerebellar signs negative.  Skin: No visible rashes, scars.   Lab results:   Basic Metabolic Panel:  Recent Labs  08/65/78 0946  NA 136  K 4.0  CL 104  CO2 20  GLUCOSE 124*  BUN 8  CREATININE 0.73  CALCIUM 9.6   Liver Function Tests:  Recent Labs  02/24/13 0946  AST 22  ALT 27  ALKPHOS 69  BILITOT 0.4  PROT 8.1  ALBUMIN 4.1    Recent Labs  02/24/13 0946  LIPASE 20   CBC:  Recent Labs  02/24/13 0946  WBC 11.7*  NEUTROABS 9.1*  HGB 14.7  HCT 41.2  MCV 92.4  PLT 225   Imaging results:  Ct Abdomen Pelvis W Contrast  02/24/2013  **ADDENDUM** CREATED: 02/24/2013 13:54:16  In addition,  there should be an impression #4 that reads: " 4.  Advanced atherosclerosis.  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 "  **END ADDENDUM** SIGNED BY: Harley Hallmark, M.D.   02/24/2013  *RADIOLOGY REPORT*  Clinical Data: 57 year old female with abdominal pain.  Right lower quadrant pain.  History pancreatitis.  CT ABDOMEN AND PELVIS WITH CONTRAST  Technique:  Multidetector CT imaging of the abdomen and pelvis was performed following the standard  protocol during bolus administration of intravenous contrast.  Contrast: OMNIPAQUE IOHEXOL 300 MG/ML  SOLN  Comparison: None.  Findings: Mild atelectasis at both lung bases.  No pericardial or pleural effusion.  Incidental lower thoracic or upper lumbar spina bifida occulta. No acute osseous abnormality identified.  Chronic right pubic rami fractures.   Mild rectal wall thickening and hyperenhancement.  No pelvic free fluid.  The sigmoid colon is very abnormal along a 89-10 cm segment, with a lobulated area of wall thickening up to 22 mm.  The sigmoid wall is body, edematous, and indistinct.  There is surrounding sigmoid mesenteric inflammation.  The lumen is obliterated.  Primarily upstream of the inflammatory changes sigmoid diverticulosis is noted.  No pneumoperitoneum.  No definite extraluminal gas.  No associated drainable fluid collection.  The inflammatory changes are inseparable from the uterus and adnexa.  Superimposed fibroid at the uterine fundus is noted.  No pelvic sidewall lymphadenopathy.  No lower abdominal retroperitoneal lymphadenopathy.  Diverticulosis in the distal descending colon without active inflammation.  Oral contrast has reached the transverse colon. Right colon and appendix are normal.  Some small bowel loops measure at the upper limits of normal.  No strong evidence of bowel obstruction.  Stomach and duodenum within normal limits.  Liver, gallbladder, spleen, and adrenal glands are within normal limits.  There is a 11 mm round or oval cystic lesion in the tail of the pancreas abutting the splenic hilum (series 2 image 23, coronal image 47).  This appears to be fairly unilocular.  The remaining pancreatic parenchyma is within normal limits.  Portal venous system and splenic vein are patent.  Advanced atherosclerosis throughout the abdomen pelvis.  Major arterial structures in the abdomen and pelvis remain patent.  However, the left superficial femoral artery is occluded at its  origin (series 2 image 91).  IMPRESSION:  1.  Severely abnormal sigmoid colon along a 9-10 cm segment.  Favor severe colitis/diverticulitis, however, follow-up is recommended to exclude  a sigmoid neoplasm which can have a similar appearance and presentation. 2.  No associated abscess or pneumoperitoneum identified. 3.  Small 11 mm cystic lesion at the tail of the pancreas near the splenic hilum.  This might be related to the patient's reported history of pancreatitis, but recommend a followup study in 1 year (preferably an abdominal MRI with pancreas protocol without and with contrast). This recommendation follows ACR consensus guidelines:  Managing Incidental Findings on Abdominal CT:  White Paper of the ACR Incidental Findings Committee.  J Am Coll Radiol 4098;1:191-478  Original Report Authenticated By: Erskine Speed, M.D.     Assessment & Plan by Problem:  Principal Problem:   Diverticulitis of colon Active Problems:   Pancreatitis, Hx of   Alcohol abuse   Patient is a 57 year old female with past medical history most significant for ongoing alcohol abuse and hypertension and does not have a primary care physician and has not seen any physician for many years comes in with chief complaints of abdominal pain and found to have  abnormal sigmoid colon consistent with severe colitis or diverticulitis. Patient has had complaint of constipation only recently which explains the diverticulitis. Patient was started on IV Cipro and Flagyl yesterday and currently n.p.o.. Although there is a concern for malignancy but given that patient does not report long-term constipation or diarrhea this is less likely at this time. Patient has never had a colonoscopy for evaluation of colon cancer in the past. GI was consulted by the ED physician and the plan colonoscopy at this time.  Patient is at very high risk of withdrawal at this time and has been put on CIWA protocol.  Rest of the medical management as per resident  note.  Lars Mage MD Faculty-Internal Medicine Residency Program

## 2013-02-25 NOTE — Progress Notes (Signed)
Subjective: Pt doing well overnight. Slightly decreased abdominal pain. VSS overnight with no acute events.   Objective: Vital signs in last 24 hours: Filed Vitals:   02/24/13 1645 02/24/13 2117 02/25/13 0500 02/25/13 0507  BP: 139/79 153/72  158/69  Pulse: 96 91  86  Temp: 99.9 F (37.7 C) 99.9 F (37.7 C)  98.5 F (36.9 C)  TempSrc: Oral Oral  Oral  Resp: 18 18  16   Height: 5\' 6"  (1.676 m)     Weight: 133 lb 9.6 oz (60.6 kg)  141 lb 12.1 oz (64.3 kg)   SpO2: 98% 97%  100%   Weight change:   Intake/Output Summary (Last 24 hours) at 02/25/13 1610 Last data filed at 02/25/13 0508  Gross per 24 hour  Intake      3 ml  Output    400 ml  Net   -397 ml   General: resting in bed, NAD HEENT: PERRL, EOMI, no scleral icterus Cardiac: RRR, no rubs, murmurs or gallops Pulm: clear to auscultation bilaterally, moving normal volumes of air Abd: soft, slight ttp RLQ and suprapubic region, no rebound, no guarding, nondistended, BS present Ext: warm and well perfused, no pedal edema Neuro: alert and oriented X3, cranial nerves II-XII grossly intact  Lab Results: Basic Metabolic Panel:  Recent Labs Lab 02/24/13 0946  NA 136  K 4.0  CL 104  CO2 20  GLUCOSE 124*  BUN 8  CREATININE 0.73  CALCIUM 9.6   Liver Function Tests:  Recent Labs Lab 02/24/13 0946  AST 22  ALT 27  ALKPHOS 69  BILITOT 0.4  PROT 8.1  ALBUMIN 4.1    Recent Labs Lab 02/24/13 0946  LIPASE 20   CBC:  Recent Labs Lab 02/24/13 0946  WBC 11.7*  NEUTROABS 9.1*  HGB 14.7  HCT 41.2  MCV 92.4  PLT 225   Urinalysis:  Recent Labs Lab 02/24/13 0945  COLORURINE YELLOW  LABSPEC 1.024  PHURINE 5.0  GLUCOSEU NEGATIVE  HGBUR NEGATIVE  BILIRUBINUR SMALL*  KETONESUR NEGATIVE  PROTEINUR NEGATIVE  UROBILINOGEN 0.2  NITRITE NEGATIVE  LEUKOCYTESUR NEGATIVE   Micro Results: No results found for this or any previous visit (from the past 240 hour(s)). Studies/Results: Ct Abdomen Pelvis W  Contrast  02/24/2013  **ADDENDUM** CREATED: 02/24/2013 13:54:16  In addition, there should be an impression #4 that reads: " 4.  Advanced atherosclerosis.  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 "  **END ADDENDUM** SIGNED BY: Harley Hallmark, M.D.   02/24/2013  *RADIOLOGY REPORT*  Clinical Data: 57 year old female with abdominal pain.  Right lower quadrant pain.  History pancreatitis.  CT ABDOMEN AND PELVIS WITH CONTRAST  Technique:  Multidetector CT imaging of the abdomen and pelvis was performed following the standard protocol during bolus administration of intravenous contrast.  Contrast: OMNIPAQUE IOHEXOL 300 MG/ML  SOLN  Comparison: None.  Findings: Mild atelectasis at both lung bases.  No pericardial or pleural effusion.  Incidental lower thoracic or upper lumbar spina bifida occulta. No acute osseous abnormality identified.  Chronic right pubic rami fractures.   Mild rectal wall thickening and hyperenhancement.  No pelvic free fluid.  The sigmoid colon is very abnormal along a 89-10 cm segment, with a lobulated area of wall thickening up to 22 mm.  The sigmoid wall is body, edematous, and indistinct.  There is surrounding sigmoid mesenteric inflammation.  The lumen is obliterated.  Primarily upstream of the inflammatory changes sigmoid diverticulosis is noted.  No pneumoperitoneum.  No definite extraluminal gas.  No associated drainable fluid collection.  The inflammatory changes are inseparable from the uterus and adnexa.  Superimposed fibroid at the uterine fundus is noted.  No pelvic sidewall lymphadenopathy.  No lower abdominal retroperitoneal lymphadenopathy.  Diverticulosis in the distal descending colon without active inflammation.  Oral contrast has reached the transverse colon. Right colon and appendix are normal.  Some small bowel loops measure at the upper limits of normal.  No strong evidence of bowel obstruction.  Stomach and duodenum within normal limits.   Liver, gallbladder, spleen, and adrenal glands are within normal limits.  There is a 11 mm round or oval cystic lesion in the tail of the pancreas abutting the splenic hilum (series 2 image 23, coronal image 47).  This appears to be fairly unilocular.  The remaining pancreatic parenchyma is within normal limits.  Portal venous system and splenic vein are patent.  Advanced atherosclerosis throughout the abdomen pelvis.  Major arterial structures in the abdomen and pelvis remain patent.  However, the left superficial femoral artery is occluded at its origin (series 2 image 91).  IMPRESSION:  1.  Severely abnormal sigmoid colon along a 9-10 cm segment.  Favor severe colitis/diverticulitis, however, follow-up is recommended to exclude  a sigmoid neoplasm which can have a similar appearance and presentation. 2.  No associated abscess or pneumoperitoneum identified. 3.  Small 11 mm cystic lesion at the tail of the pancreas near the splenic hilum.  This might be related to the patient's reported history of pancreatitis, but recommend a followup study in 1 year (preferably an abdominal MRI with pancreas protocol without and with contrast). This recommendation follows ACR consensus guidelines:  Managing Incidental Findings on Abdominal CT:  White Paper of the ACR Incidental Findings Committee.  J Am Coll Radiol 1610;9:604-540  Original Report Authenticated By: Erskine Speed, M.D.    Medications: I have reviewed the patient's current medications. Scheduled Meds: . ciprofloxacin  400 mg Intravenous Q12H  . enoxaparin (LOVENOX) injection  40 mg Subcutaneous Q24H  . folic acid  1 mg Oral Daily  . metronidazole  500 mg Intravenous Q8H  . multivitamin with minerals  1 tablet Oral Daily  . nicotine  14 mg Transdermal Daily  . sodium chloride  3 mL Intravenous Q12H  . thiamine  100 mg Oral Daily   Or  . thiamine  100 mg Intravenous Daily   Continuous Infusions: . sodium chloride 75 mL/hr at 02/24/13 1953   PRN  Meds:.sodium chloride, LORazepam, LORazepam, morphine injection, ondansetron (ZOFRAN) IV, ondansetron, sodium chloride Assessment/Plan: 1. Diverticulitis: Pt continues to not meet SIRS criteria at this time. In the ED pt had abdominal CT that showed concerning inflammation around sigmoid colon favoring diverticulitis but not excluded malignancy. GI was consulted, Dr. Loreta Ave,  by ED physician to evaluate and the recommendation for admission and colonoscopy was made. Pt normal lipase (20). Touched base with Dr. Loreta Ave on 02/25/13 and stated Dr. Elnoria Howard will see pt as was unaware of admission.  -NPO  -IV cipro and flagyl  -IVF NS 125cc/hr  -appreciate GI recs- plan to do colonoscopy during admission TBD by Dr. Elnoria Howard  2. Alcohol abuse: Pt describes significant acute increase in EtOH ingestion along with daily EtOH. Pt denied ever having been admitted for withdrawal or had significant w/d symptoms in the past.  -CIWA  -CSW consult for possible outpt programs, Alcohol Anonymous    Dispo: Disposition is deferred at this time, awaiting improvement of current medical problems.  Anticipated discharge in approximately 1-2 day(s).   The patient does not have a current PCP (Default, Provider, MD), therefore will not be requiring OPC follow-up after discharge.   The patient does not have transportation limitations that hinder transportation to clinic appointments.  .Services Needed at time of discharge: Y = Yes, Blank = No PT:   OT:   RN:   Equipment:   Other:     LOS: 1 day   Christen Bame 02/25/2013, 9:22 AM Pgr: [phone]

## 2013-02-26 ENCOUNTER — Encounter (HOSPITAL_COMMUNITY): Payer: Self-pay

## 2013-02-26 ENCOUNTER — Encounter (HOSPITAL_COMMUNITY)
Admission: EM | Disposition: A | Discharge status: Home / Self Care | Payer: Self-pay | Source: Home / Self Care | Attending: Internal Medicine

## 2013-02-26 DIAGNOSIS — K859 Acute pancreatitis without necrosis or infection, unspecified: Secondary | ICD-10-CM

## 2013-02-26 DIAGNOSIS — F1010 Alcohol abuse, uncomplicated: Secondary | ICD-10-CM

## 2013-02-26 HISTORY — PX: FLEXIBLE SIGMOIDOSCOPY: SHX5431

## 2013-02-26 SURGERY — SIGMOIDOSCOPY, FLEXIBLE
Anesthesia: Moderate Sedation

## 2013-02-26 MED ORDER — FENTANYL CITRATE 0.05 MG/ML IJ SOLN
INTRAMUSCULAR | Status: DC | PRN
  Administered 2013-02-26 (×3): 25 ug via INTRAVENOUS

## 2013-02-26 MED ORDER — AMOXICILLIN-POT CLAVULANATE 875-125 MG PO TABS: 1.0000 | ORAL_TABLET | Freq: Two times a day (BID) | ORAL | Status: DC

## 2013-02-26 MED ORDER — FENTANYL CITRATE 0.05 MG/ML IJ SOLN
INTRAMUSCULAR | Status: AC
  Filled 2013-02-26: qty 2

## 2013-02-26 MED ORDER — MIDAZOLAM HCL 10 MG/2ML IJ SOLN
INTRAMUSCULAR | Status: DC | PRN
  Administered 2013-02-26: 1 mg via INTRAVENOUS
  Administered 2013-02-26 (×2): 2 mg via INTRAVENOUS

## 2013-02-26 MED ORDER — AMOXICILLIN-POT CLAVULANATE 875-125 MG PO TABS
1.0000 | ORAL_TABLET | Freq: Two times a day (BID) | ORAL | Status: DC
  Administered 2013-02-26: 1 via ORAL
  Filled 2013-02-26: qty 1

## 2013-02-26 MED ORDER — OXYCODONE-ACETAMINOPHEN 5-325 MG PO TABS
1.0000 | ORAL_TABLET | ORAL | Status: DC | PRN
Start: 2013-02-26 — End: 2013-09-23

## 2013-02-26 MED ORDER — MIDAZOLAM HCL 5 MG/ML IJ SOLN
INTRAMUSCULAR | Status: AC
  Filled 2013-02-26: qty 2

## 2013-02-26 NOTE — Care Management Note (Signed)
  Page 1 of 1   02/26/2013     1:16:29 PM   CARE MANAGEMENT NOTE 02/26/2013  Patient:  TINNA, KOLKER   Account Number:  [id number]  Date Initiated:  02/26/2013  Documentation initiated by:  Ronny Flurry  Subjective/Objective Assessment:     Action/Plan:   Anticipated DC Date:  02/26/2013   Anticipated DC Plan:  HOME/SELF CARE      DC Planning Services  MATCH Program      Choice offered to / List presented to:             Status of service:  Completed, signed off Medicare Important Message given?   (If response is "NO", the following Medicare IM given date fields will be blank) Date Medicare IM given:   Date Additional Medicare IM given:    Discharge Disposition:  HOME/SELF CARE  Per UR Regulation:  Reviewed for med. necessity/level of care/duration of stay  If discussed at Long Length of Stay Meetings, dates discussed:    Comments:

## 2013-02-26 NOTE — Progress Notes (Signed)
Subjective: Pt doing well overnight. VSS overnight with no acute events. FFS on 02/26/13.   Objective: Vital signs in last 24 hours: Filed Vitals:   02/25/13 1403 02/25/13 2150 02/26/13 0500 02/26/13 0629  BP: 152/60 156/71  142/81  Pulse: 81 88  66  Temp: 97.9 F (36.6 C) 98.1 F (36.7 C)  98.2 F (36.8 C)  TempSrc: Oral Oral  Oral  Resp: 20 18  16   Height:      Weight:   144 lb 13.5 oz (65.7 kg) 144 lb 2.9 oz (65.4 kg)  SpO2: 96% 97%  100%   Weight change: 14 lb 13.5 oz (6.733 kg)  Intake/Output Summary (Last 24 hours) at 02/26/13 0711 Last data filed at 02/25/13 2151  Gross per 24 hour  Intake    603 ml  Output    600 ml  Net      3 ml   General: resting in bed, NAD HEENT: PERRL, EOMI, no scleral icterus Cardiac: RRR, no rubs, murmurs or gallops Pulm: clear to auscultation bilaterally, moving normal volumes of air Abd: soft, slight ttp RLQ and suprapubic region, no rebound, no guarding, nondistended, BS present Ext: warm and well perfused, no pedal edema Neuro: alert and oriented X3, cranial nerves II-XII grossly intact  Lab Results: Basic Metabolic Panel:  Recent Labs Lab 02/24/13 0946 02/25/13 0915  NA 136 135  K 4.0 4.7  CL 104 104  CO2 20 20  GLUCOSE 124* 103*  BUN 8 5*  CREATININE 0.73 0.66  CALCIUM 9.6 8.8   Liver Function Tests:  Recent Labs Lab 02/24/13 0946  AST 22  ALT 27  ALKPHOS 69  BILITOT 0.4  PROT 8.1  ALBUMIN 4.1    Recent Labs Lab 02/24/13 0946  LIPASE 20   CBC:  Recent Labs Lab 02/24/13 0946 02/25/13 0915  WBC 11.7* 6.6  NEUTROABS 9.1*  --   HGB 14.7 12.5  HCT 41.2 36.4  MCV 92.4 93.8  PLT 225 178   Urinalysis:  Recent Labs Lab 02/24/13 0945  COLORURINE YELLOW  LABSPEC 1.024  PHURINE 5.0  GLUCOSEU NEGATIVE  HGBUR NEGATIVE  BILIRUBINUR SMALL*  KETONESUR NEGATIVE  PROTEINUR NEGATIVE  UROBILINOGEN 0.2  NITRITE NEGATIVE  LEUKOCYTESUR NEGATIVE   Micro Results: No results found for this or any previous  visit (from the past 240 hour(s)). Studies/Results: Ct Abdomen Pelvis W Contrast  02/24/2013   **ADDENDUM** CREATED: 02/24/2013 13:54:16  In addition, there should be an impression #4 that reads: " 4.  Advanced atherosclerosis.  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 "  **END ADDENDUM** SIGNED BY: Harley Hallmark, M.D.  02/24/2013   *RADIOLOGY REPORT*  Clinical Data: 57 year old female with abdominal pain.  Right lower quadrant pain.  History pancreatitis.  CT ABDOMEN AND PELVIS WITH CONTRAST  Technique:  Multidetector CT imaging of the abdomen and pelvis was performed following the standard protocol during bolus administration of intravenous contrast.  Contrast: OMNIPAQUE IOHEXOL 300 MG/ML  SOLN  Comparison: None.  Findings: Mild atelectasis at both lung bases.  No pericardial or pleural effusion.  Incidental lower thoracic or upper lumbar spina bifida occulta. No acute osseous abnormality identified.  Chronic right pubic rami fractures.   Mild rectal wall thickening and hyperenhancement.  No pelvic free fluid.  The sigmoid colon is very abnormal along a 89-10 cm segment, with a lobulated area of wall thickening up to 22 mm.  The sigmoid wall is body, edematous, and indistinct.  There is surrounding  sigmoid mesenteric inflammation.  The lumen is obliterated.  Primarily upstream of the inflammatory changes sigmoid diverticulosis is noted.  No pneumoperitoneum.  No definite extraluminal gas.  No associated drainable fluid collection.  The inflammatory changes are inseparable from the uterus and adnexa.  Superimposed fibroid at the uterine fundus is noted.  No pelvic sidewall lymphadenopathy.  No lower abdominal retroperitoneal lymphadenopathy.  Diverticulosis in the distal descending colon without active inflammation.  Oral contrast has reached the transverse colon. Right colon and appendix are normal.  Some small bowel loops measure at the upper limits of normal.  No strong  evidence of bowel obstruction.  Stomach and duodenum within normal limits.  Liver, gallbladder, spleen, and adrenal glands are within normal limits.  There is a 11 mm round or oval cystic lesion in the tail of the pancreas abutting the splenic hilum (series 2 image 23, coronal image 47).  This appears to be fairly unilocular.  The remaining pancreatic parenchyma is within normal limits.  Portal venous system and splenic vein are patent.  Advanced atherosclerosis throughout the abdomen pelvis.  Major arterial structures in the abdomen and pelvis remain patent.  However, the left superficial femoral artery is occluded at its origin (series 2 image 91).  IMPRESSION:  1.  Severely abnormal sigmoid colon along a 9-10 cm segment.  Favor severe colitis/diverticulitis, however, follow-up is recommended to exclude  a sigmoid neoplasm which can have a similar appearance and presentation. 2.  No associated abscess or pneumoperitoneum identified. 3.  Small 11 mm cystic lesion at the tail of the pancreas near the splenic hilum.  This might be related to the patient's reported history of pancreatitis, but recommend a followup study in 1 year (preferably an abdominal MRI with pancreas protocol without and with contrast). This recommendation follows ACR consensus guidelines:  Managing Incidental Findings on Abdominal CT:  White Paper of the ACR Incidental Findings Committee.  J Am Coll Radiol 0454;0:981-191  Original Report Authenticated By: Erskine Speed, M.D.   Medications: I have reviewed the patient's current medications. Scheduled Meds: . ciprofloxacin  400 mg Intravenous Q12H  . enoxaparin (LOVENOX) injection  40 mg Subcutaneous Q24H  . folic acid  1 mg Oral Daily  . metronidazole  500 mg Intravenous Q8H  . multivitamin with minerals  1 tablet Oral Daily  . nicotine  14 mg Transdermal Daily  . sodium chloride  3 mL Intravenous Q12H  . thiamine  100 mg Oral Daily   Or  . thiamine  100 mg Intravenous Daily    Continuous Infusions: . sodium chloride 75 mL/hr at 02/25/13 2216  . sodium chloride     PRN Meds:.sodium chloride, LORazepam, LORazepam, morphine injection, ondansetron (ZOFRAN) IV, ondansetron, sodium chloride Assessment/Plan: 1. Diverticulitis: Pt continues to not meet SIRS criteria at this time. In the ED pt had abdominal CT that showed concerning inflammation around sigmoid colon favoring diverticulitis but not excluded malignancy. GI was consulted, Dr. Loreta Ave,  by ED physician to evaluate and the recommendation for admission and colonoscopy was made. Pt normal lipase (20). FFS on 02/26/13 showed just diverticulitis and no concern for neoplasm.  -NPO  -IV cipro and flagyl  -IVF NS 125cc/hr  -appreciate GI recs-FFS completed on 02/26/13 and no concern for neoplasm, f/u colonoscopy in 4-6 wks scheduled  2. Cystic lesion Tail of pancreas: incidental finding on CT abd on admission and Dr. Elnoria Howard, GI recommend f/u MRI in 1 yr to evaluate but no concern at this time for neoplasm.  3. Alcohol abuse: Pt describes significant acute increase in EtOH ingestion along with daily EtOH. Pt denied ever having been admitted for withdrawal or had significant w/d symptoms in the past.  -CIWA  -CSW consult for possible outpt programs, Alcohol Anonymous   Dispo: Disposition is deferred at this time, awaiting improvement of current medical problems.  Anticipated discharge in approximately 1-2 day(s).   The patient does not have a current PCP (Default, Provider, MD), therefore will not be requiring OPC follow-up after discharge.   The patient does not have transportation limitations that hinder transportation to clinic appointments.  .Services Needed at time of discharge: Y = Yes, Blank = No PT:   OT:   RN:   Equipment:   Other:     LOS: 2 days   Christen Bame 02/26/2013, 7:11 AM Pgr: [phone]

## 2013-02-26 NOTE — Interval H&P Note (Signed)
History and Physical Interval Note:  02/26/2013 7:42 AM  Eulas Post  has presented today for surgery, with the diagnosis of Abnormal CT scan  The various methods of treatment have been discussed with the patient and family. After consideration of risks, benefits and other options for treatment, the patient has consented to  Procedure(s): FLEXIBLE SIGMOIDOSCOPY (N/A) as a surgical intervention .  The patient's history has been reviewed, patient examined, no change in status, stable for surgery.  I have reviewed the patient's chart and labs.  Questions were answered to the patient's satisfaction.     Kathlean Cinco D

## 2013-02-26 NOTE — Op Note (Signed)
Lindsey Leon [facility] [address] [place], [address]   FLEXIBLE SIGMOIDOSCOPY PROCEDURE REPORT  PATIENT: Lindsey Leon, Lindsey Leon  MR#: [id number] BIRTHDATE: [birth year] , 56  yrs. old GENDER: Female ENDOSCOPIST: Jeani Hawking, MD REFERRED BY: PROCEDURE DATE:  02/26/2013 PROCEDURE:   Sigmoidoscopy, diagnostic ASA CLASS:   Class III INDICATIONS:an abnormal CT. MEDICATIONS: Versed 5 mg IV and Fentanyl 75 mcg IV  DESCRIPTION OF PROCEDURE:   After the risks benefits and alternatives of the procedure were thoroughly explained, informed consent was obtained.  revealed no abnormalities of the rectum. The [id number] ([id number])  endoscope was introduced through the anus  and advanced to the descending colon , limited by No adverse events experienced.   The quality of the prep was adequate .  The instrument was then slowly withdrawn as the mucosa was fully examined.         FINDINGS: The sigmoid colon demonstrated the diverticulitis that was identified on the CT scan.  No evidence of any masses or polyps. The area was thickened and edematous.   Retroflexed views revealed internal/external hemorrhoid.    The scope was then withdrawn from the patient and the procedure terminated.  COMPLICATIONS: There were no complications.  ENDOSCOPIC IMPRESSION: 1) Sigmoid diverticulitis. 2) Diverticula. 3) Int/Ext Hemorrhoids.  RECOMMENDATIONS: 1) Continue with antibiotics. 2) Screening colonoscopy after 4-6 weeks, i.e, after healing of her diverticulitis. 3) Signing off.   REPEAT EXAM:   _______________________________ eSignedJeani Hawking, MD 02/26/2013 8:09 AM   CC:

## 2013-02-26 NOTE — H&P (View-Only) (Signed)
Reason for Consult:  Diverticulitis Referring Physician: Teaching Service  Eulas Post HPI: This is a 57 year old female who was admitted for abdominal pain.  Further work up revealed a severe diverticulitis, however, a sigmoid colon mass was not able to be excluded.  Additionally the CT scan revealed an 11 mm cyst in the tail of the pancreas.  She has a history of ETOH abuse and a reported history of pancreatitis.  Approximately 8 months ago she had a minor bout of hematochezia.  No prior colonoscopy.  She denies any known family history of colon cancer or any problems with diarrhea or melena.  Constipation appears to be a problem of late.  Past Medical History  Diagnosis Date  . Pancreatitis   . Hypertension     not on medications    History reviewed. No pertinent past surgical history.  Family History  Problem Relation Age of Onset  . Cancer Mother     passed away. unknown age  . Healthy Father   . Hypertension Sister   . Diabetes Mellitus II Sister     Social History:  reports that she has been smoking Cigarettes.  She has a 30 pack-year smoking history. Her smokeless tobacco use includes Chew. She reports that  drinks alcohol. She reports that she uses illicit drugs (Marijuana).  Allergies: No Known Allergies  Medications:  Scheduled: . ciprofloxacin  400 mg Intravenous Q12H  . enoxaparin (LOVENOX) injection  40 mg Subcutaneous Q24H  . folic acid  1 mg Oral Daily  . metronidazole  500 mg Intravenous Q8H  . multivitamin with minerals  1 tablet Oral Daily  . nicotine  14 mg Transdermal Daily  . sodium chloride  3 mL Intravenous Q12H  . thiamine  100 mg Oral Daily   Or  . thiamine  100 mg Intravenous Daily   Continuous: . sodium chloride 75 mL/hr at 02/25/13 1033    Results for orders placed during the hospital encounter of 02/24/13 (from the past 24 hour(s))  CBC     Status: None   Collection Time    02/25/13  9:15 AM      Result Value Range   WBC 6.6  4.0 -  10.5 K/uL   RBC 3.88  3.87 - 5.11 MIL/uL   Hemoglobin 12.5  12.0 - 15.0 g/dL   HCT 16.1  09.6 - 04.5 %   MCV 93.8  78.0 - 100.0 fL   MCH 32.2  26.0 - 34.0 pg   MCHC 34.3  30.0 - 36.0 g/dL   RDW 40.9  81.1 - 91.4 %   Platelets 178  150 - 400 K/uL     Ct Abdomen Pelvis W Contrast  02/24/2013  **ADDENDUM** CREATED: 02/24/2013 13:54:16  In addition, there should be an impression #4 that reads: " 4.  Advanced atherosclerosis.  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 "  **END ADDENDUM** SIGNED BY: Harley Hallmark, M.D.   02/24/2013  *RADIOLOGY REPORT*  Clinical Data: 57 year old female with abdominal pain.  Right lower quadrant pain.  History pancreatitis.  CT ABDOMEN AND PELVIS WITH CONTRAST  Technique:  Multidetector CT imaging of the abdomen and pelvis was performed following the standard protocol during bolus administration of intravenous contrast.  Contrast: OMNIPAQUE IOHEXOL 300 MG/ML  SOLN  Comparison: None.  Findings: Mild atelectasis at both lung bases.  No pericardial or pleural effusion.  Incidental lower thoracic or upper lumbar spina bifida occulta. No acute osseous abnormality identified.  Chronic right pubic rami fractures.   Mild rectal wall thickening and hyperenhancement.  No pelvic free fluid.  The sigmoid colon is very abnormal along a 89-10 cm segment, with a lobulated area of wall thickening up to 22 mm.  The sigmoid wall is body, edematous, and indistinct.  There is surrounding sigmoid mesenteric inflammation.  The lumen is obliterated.  Primarily upstream of the inflammatory changes sigmoid diverticulosis is noted.  No pneumoperitoneum.  No definite extraluminal gas.  No associated drainable fluid collection.  The inflammatory changes are inseparable from the uterus and adnexa.  Superimposed fibroid at the uterine fundus is noted.  No pelvic sidewall lymphadenopathy.  No lower abdominal retroperitoneal lymphadenopathy.  Diverticulosis in the distal  descending colon without active inflammation.  Oral contrast has reached the transverse colon. Right colon and appendix are normal.  Some small bowel loops measure at the upper limits of normal.  No strong evidence of bowel obstruction.  Stomach and duodenum within normal limits.  Liver, gallbladder, spleen, and adrenal glands are within normal limits.  There is a 11 mm round or oval cystic lesion in the tail of the pancreas abutting the splenic hilum (series 2 image 23, coronal image 47).  This appears to be fairly unilocular.  The remaining pancreatic parenchyma is within normal limits.  Portal venous system and splenic vein are patent.  Advanced atherosclerosis throughout the abdomen pelvis.  Major arterial structures in the abdomen and pelvis remain patent.  However, the left superficial femoral artery is occluded at its origin (series 2 image 91).  IMPRESSION:  1.  Severely abnormal sigmoid colon along a 9-10 cm segment.  Favor severe colitis/diverticulitis, however, follow-up is recommended to exclude  a sigmoid neoplasm which can have a similar appearance and presentation. 2.  No associated abscess or pneumoperitoneum identified. 3.  Small 11 mm cystic lesion at the tail of the pancreas near the splenic hilum.  This might be related to the patient's reported history of pancreatitis, but recommend a followup study in 1 year (preferably an abdominal MRI with pancreas protocol without and with contrast). This recommendation follows ACR consensus guidelines:  Managing Incidental Findings on Abdominal CT:  White Paper of the ACR Incidental Findings Committee.  J Am Coll Radiol [id number]  Original Report Authenticated By: Erskine Speed, M.D.     ROS:  As stated above in the HPI otherwise negative.  Blood pressure 152/60, pulse 81, temperature 97.9 F (36.6 C), temperature source Oral, resp. rate 20, height 5\' 6"  (1.676 m), weight 141 lb 12.1 oz (64.3 kg), SpO2 96.00%.    PE: Gen: NAD, Alert and  Oriented HEENT:  [place]/AT, EOMI Neck: Supple, no LAD Lungs: CTA Bilaterally CV: RRR without M/G/R ABM: Soft, NTND, +BS Ext: No C/C/E  Assessment/Plan: 1) Diverticulitis. 2) Cystic lesion in the tail of the pancreas.   The CT scan is suggestive of a diverticulitis, however, it will be prudent to evaluate her with a FFS to rule out any evidence of a neoplasm in the sigmoid colon.  Preferably it is best to wait 4-6 weeks before an endoscopic evaluation, but I think with minimal insufflation she should be able to undergo the procedure with out any problems.  It is best to perform the work up now as she does not appear to be reliable with follow up medical care.  As for her tail of the pancreas cyst, she needs to have a repeat scan in one year.  As recommended by Radiology, she will require  an MRI.  Plan: 1) FFS tomorrow. 2) If the FFS is negative for a mass she will require a colonoscopy as an out patient as she is older than 50 for routine screening purposes. 3) MRI in one year.  Shemaiah Round D 02/25/2013, 2:04 PM

## 2013-02-26 NOTE — Progress Notes (Signed)
Pt is being d/ced; awaiting on ride from her uncle

## 2013-02-26 NOTE — Discharge Summary (Signed)
Internal Medicine Teaching [facility] Discharge Note  Name: Lindsey Leon MRN: [id number] DOB: 1955-11-26 57 y.o.  Date of Admission: 02/24/2013 10:16 AM Date of Discharge: 02/28/2013 Attending Physician: No att. providers found  Discharge Diagnosis: Principal Problem:   Diverticulitis of colon Active Problems:   Pancreatitis, Hx of   Alcohol abuse   Discharge Medications:   Medication List    TAKE these medications       amoxicillin-clavulanate 875-125 MG per tablet  Commonly known as:  AUGMENTIN  Take 1 tablet by mouth every 12 (twelve) hours.     oxyCODONE-acetaminophen 5-325 MG per tablet  Commonly known as:  ROXICET  Take 1 tablet by mouth every 4 (four) hours as needed for pain.        Disposition and follow-up:   Ms.[name] was discharged from [facility] in Good condition.  At the hospital follow up visit please address resolution of abdominal pain and alcohol cessation.   Follow-up Appointments: Follow-up Information   Follow up with Theda Belfast, MD. (March 31, 2013 at 1045 AM as needed as needed)    Contact information:   [address] [place] [address] [phone]       Call [facility]. (f/u MRI in 1 year)    Contact information:   [address] [place] [address]       Follow up with [facility].   Contact information:   [address]  [place], [place] [address]       Consultations:   Gastroenterology   Procedures Performed:  Ct Abdomen Pelvis W Contrast  02/24/2013   **ADDENDUM** CREATED: 02/24/2013 13:54:16  In addition, there should be an impression #4 that reads: " 4.  Advanced atherosclerosis.  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 "  **END ADDENDUM** SIGNED BY: Harley Hallmark, M.D.  02/24/2013   *RADIOLOGY REPORT*  Clinical Data: 57 year old female with abdominal pain.  Right lower quadrant pain.  History pancreatitis.  CT ABDOMEN AND  PELVIS WITH CONTRAST  Technique:  Multidetector CT imaging of the abdomen and pelvis was performed following the standard protocol during bolus administration of intravenous contrast.  Contrast: OMNIPAQUE IOHEXOL 300 MG/ML  SOLN  Comparison: None.  Findings: Mild atelectasis at both lung bases.  No pericardial or pleural effusion.  Incidental lower thoracic or upper lumbar spina bifida occulta. No acute osseous abnormality identified.  Chronic right pubic rami fractures.   Mild rectal wall thickening and hyperenhancement.  No pelvic free fluid.  The sigmoid colon is very abnormal along a 89-10 cm segment, with a lobulated area of wall thickening up to 22 mm.  The sigmoid wall is body, edematous, and indistinct.  There is surrounding sigmoid mesenteric inflammation.  The lumen is obliterated.  Primarily upstream of the inflammatory changes sigmoid diverticulosis is noted.  No pneumoperitoneum.  No definite extraluminal gas.  No associated drainable fluid collection.  The inflammatory changes are inseparable from the uterus and adnexa.  Superimposed fibroid at the uterine fundus is noted.  No pelvic sidewall lymphadenopathy.  No lower abdominal retroperitoneal lymphadenopathy.  Diverticulosis in the distal descending colon without active inflammation.  Oral contrast has reached the transverse colon. Right colon and appendix are normal.  Some small bowel loops measure at the upper limits of normal.  No strong evidence of bowel obstruction.  Stomach and duodenum within normal limits.  Liver, gallbladder, spleen, and adrenal glands are within normal limits.  There is  a 11 mm round or oval cystic lesion in the tail of the pancreas abutting the splenic hilum (series 2 image 23, coronal image 47).  This appears to be fairly unilocular.  The remaining pancreatic parenchyma is within normal limits.  Portal venous system and splenic vein are patent.  Advanced atherosclerosis throughout the abdomen pelvis.  Major arterial  structures in the abdomen and pelvis remain patent.  However, the left superficial femoral artery is occluded at its origin (series 2 image 91).  IMPRESSION:  1.  Severely abnormal sigmoid colon along a 9-10 cm segment.  Favor severe colitis/diverticulitis, however, follow-up is recommended to exclude  a sigmoid neoplasm which can have a similar appearance and presentation. 2.  No associated abscess or pneumoperitoneum identified. 3.  Small 11 mm cystic lesion at the tail of the pancreas near the splenic hilum.  This might be related to the patient's reported history of pancreatitis, but recommend a followup study in 1 year (preferably an abdominal MRI with pancreas protocol without and with contrast). This recommendation follows ACR consensus guidelines:  Managing Incidental Findings on Abdominal CT:  White Paper of the ACR Incidental Findings Committee.  J Am Coll Radiol 1610;9:604-540  Original Report Authenticated By: Erskine Speed, M.D.   Admission HPI: [name] is a 57 yo AA woman no significant pmh and lost to the medical system as has not seen a physician in many years. Patient states that abdominal pain started about 4 days and then gradually became worse until she presented. Pt has had a remote hx of pancreatitis in the past. Pt denied any diarrhea but had some constipation and bloating that was relieved by epsom salts. She didn't notice any mucus, hematochezia, or frank blood. Pt denied having alternating bowel habits or change in character of her BMs. She is a daily EtOH drinker "with 240s" and then on the weekend she drinks "a couple of 6s with a friend." therefore pt states her last drink was the day before admission. She only had one episode of vomiting w/o hematemesis. She has not withdrawn from EtOH before and has had tried outpt management but states "i just like it." she also smokes cigarettes but denied any other illicit drugs. She denied having any fevers/chills, nausea, diarrhea, sick  contacts, or recent travel. Pt now lives independently and works at [employer]. She is not currently sexually active.    Hospital Course by problem list: 1. Diverticulitis: Pt presented with acute abdominal pain x4 days. In the ED pt had abdominal CT that showed concerning inflammation around sigmoid colon favoring diverticulitis but could not exclude malignancy. Therefore, GI was consulted, Dr. Loreta Ave, by ED physician to evaluate and the recommendation for admission and colonoscopy was made. Pt never met SIRS criteria and had normal lipase (20). Pt was started on IV cipro and flagyl and IVF for management.  FFS on 02/26/13 showed just diverticulitis and no concern for neoplasm. Given the significant EtOH hx and the risk for disulfiram reaction with metronidazole pt was transitioned to oral Augmentin 875mg  to complete a total of 10 days of Abx. This was provided by care management from the hospital before d/c. GI recommended f/u colonoscopy in 4-6 wks and this appt was made with Dr. Elnoria Howard.    2. Cystic lesion Tail of pancreas: This was an incidental finding on CT abd on admission and Dr. Elnoria Howard, GI recommend f/u MRI in 1 yr to evaluate with no current concern at this time for neoplasm.   3.  Alcohol abuse: Pt described a significant acute increase in EtOH ingestion 2 days before admission and a hx of pancreatitis related to binge drinking. The patient does drink daily EtOH. Pt denied ever having been admitted for withdrawal or had significant w/d symptoms in the past.  Pt was maintained on CIWA while inpatient and then information regarding outpt detox was provided. Pt seemed to be at contemplation/preparation stage of quitting.    Discharge Vitals:  BP 156/77  Pulse 69  Temp(Src) 98 F (36.7 C) (Oral)  Resp 18  Ht 5\' 6"  (1.676 m)  Wt 144 lb 2.9 oz (65.4 kg)  BMI 23.28 kg/m2  SpO2 100% General: resting in bed, NAD  HEENT: PERRL, EOMI, no scleral icterus  Cardiac: RRR, no rubs, murmurs or gallops   Pulm: clear to auscultation bilaterally, moving normal volumes of air  Abd: soft, slight ttp RLQ and suprapubic region, no rebound, no guarding, nondistended, BS present  Ext: warm and well perfused, no pedal edema  Neuro: alert and oriented X3, cranial nerves II-XII grossly intact  Discharge Labs: No results found for this or any previous visit (from the past 24 hour(s)).  SignedLars Mage 02/28/2013, 11:18 AM   Time Spent on Discharge: Services Ordered on Discharge: none Equipment Ordered on Discharge: none

## 2013-02-27 ENCOUNTER — Encounter (HOSPITAL_COMMUNITY): Payer: Self-pay | Admitting: Gastroenterology

## 2013-09-23 ENCOUNTER — Encounter (HOSPITAL_COMMUNITY): Payer: Self-pay | Admitting: Emergency Medicine

## 2013-09-23 ENCOUNTER — Emergency Department (HOSPITAL_COMMUNITY)
Admission: EM | Admit: 2013-09-23 | Discharge: 2013-09-24 | Disposition: A | Discharge status: Home / Self Care | Payer: Self-pay | Attending: Emergency Medicine | Admitting: Emergency Medicine

## 2013-09-23 DIAGNOSIS — Z8719 Personal history of other diseases of the digestive system: Secondary | ICD-10-CM | POA: Insufficient documentation

## 2013-09-23 DIAGNOSIS — N390 Urinary tract infection, site not specified: Secondary | ICD-10-CM | POA: Insufficient documentation

## 2013-09-23 DIAGNOSIS — M545 Low back pain, unspecified: Secondary | ICD-10-CM | POA: Insufficient documentation

## 2013-09-23 DIAGNOSIS — M549 Dorsalgia, unspecified: Secondary | ICD-10-CM

## 2013-09-23 DIAGNOSIS — I10 Essential (primary) hypertension: Secondary | ICD-10-CM | POA: Insufficient documentation

## 2013-09-23 DIAGNOSIS — F17200 Nicotine dependence, unspecified, uncomplicated: Secondary | ICD-10-CM | POA: Insufficient documentation

## 2013-09-23 NOTE — ED Notes (Signed)
Pt states that she was at a parade on Friday and does not remember doing anything to her back. Pt states that she has been having lower back pain since Sat. But only when she stands. Pt ambulatory, denies problems with urine or bowel.

## 2013-09-23 NOTE — ED Provider Notes (Signed)
CSN: [id number]     Arrival date & time 09/23/13  2159 History  This chart was scribed for Lindsey Ceo, PA-C, working with Lindsey Camel, MD by Lindsey Leon, ED Scribe. This patient was seen in room TR05C/TR05C and the patient's care was started at 11:41 PM.     Chief Complaint  Patient presents with  . Back Pain    Patient is a 57 y.o. female presenting with back pain. The history is provided by the patient. No language interpreter was used.  Back Pain Associated symptoms: abdominal pain (radiating)   Associated symptoms: no chest pain, no dysuria and no fever     HPI Comments: Lindsey Leon is a 58 y.o. female with a PMH of HTN and pancreatitis who presents to the Emergency Department complaining of intermittent lower back pain that began three days ago.  Patient states that 4 days ago she went out and was "messing around" with her friend, but denies any injuries or trauma.  She states she woke up the next morning and had pain in her lower back diffusely L = R.  She admits to drinking alcohol that night.  She has a hx of pancreatitis, however, her pain today is not similar to her pain today.  Her back pain is located diffusely in the lower back.  She also has radiation around the upper portion of her abdomen, but denies any significant abdominal pain.  Her pain is worse with movement, mostly standing.  She has not done anything to treat her pain.  She denies any fevers, chills, change in appetite/activity, dysuria, hematuria, vaginal discharge or bleeding, nausea, vomiting, no loss of bowel/bladder function, constipation, diarrhea, blood in stool, chest pain, SOB, or cough.     Past Medical History  Diagnosis Date  . Pancreatitis   . Hypertension     not on medications   Past Surgical History  Procedure Laterality Date  . Flexible sigmoidoscopy N/A 02/26/2013    Procedure: FLEXIBLE SIGMOIDOSCOPY;  Surgeon: Theda Belfast, MD;  Location: [facility] ENDOSCOPY;  Service: Endoscopy;   Laterality: N/A;   Family History  Problem Relation Age of Onset  . Cancer Mother     passed away. unknown age  . Healthy Father   . Hypertension Sister   . Diabetes Mellitus II Sister    History  Substance Use Topics  . Smoking status: Current Every Day Smoker -- 1.00 packs/day for 30 years    Types: Cigarettes  . Smokeless tobacco: Current User    Types: Chew  . Alcohol Use: Yes     Comment: drink 48 oz daily at weeak days and 80 oz daily on weekend. last drink on 02/23/13   OB History   Grav Para Term Preterm Abortions TAB SAB Ect Mult Living                 Review of Systems  Constitutional: Negative for fever, chills, diaphoresis, activity change, appetite change and fatigue.  HENT: Negative for congestion and rhinorrhea.   Respiratory: Negative for cough and shortness of breath.   Cardiovascular: Negative for chest pain and leg swelling.  Gastrointestinal: Positive for abdominal pain (radiating). Negative for nausea, vomiting, diarrhea, constipation and blood in stool.  Genitourinary: Negative for dysuria, hematuria, vaginal bleeding and vaginal discharge.  Musculoskeletal: Positive for back pain.  All other systems reviewed and are negative.   Allergies  Review of patient's allergies indicates no known allergies.  Home Medications  No current outpatient prescriptions on file.  Triage Vitals: BP 154/93  Pulse 85  Temp(Src) 99.3 F (37.4 C) (Oral)  Resp 18  Ht 5\' 6"  (1.676 m)  Wt 150 lb 14.4 oz (68.448 kg)  BMI 24.37 kg/m2  SpO2 97%  Filed Vitals:   09/23/13 2218 09/24/13 0241  BP: 154/93 161/75  Pulse: 85 79  Temp: 99.3 F (37.4 C)   TempSrc: Oral   Resp: 18 18  Height: 5\' 6"  (1.676 m)   Weight: 150 lb 14.4 oz (68.448 kg)   SpO2: 97% 98%    Physical Exam  Nursing note and vitals reviewed. Constitutional: She is oriented to person, place, and time. She appears well-developed and well-nourished. No distress.  HENT:  Head: Normocephalic and  atraumatic.  Right Ear: External ear normal.  Left Ear: External ear normal.  Nose: Nose normal.  Mouth/Throat: Oropharynx is clear and moist. No oropharyngeal exudate.  Eyes: Conjunctivae and EOM are normal. Right eye exhibits no discharge. Left eye exhibits no discharge.  Neck: Normal range of motion. Neck supple.  Cardiovascular: Normal rate, regular rhythm, normal heart sounds and intact distal pulses.  Exam reveals no gallop and no friction rub.   No murmur heard. Dorsalis pedis pulses present and equal bilaterally.    Pulmonary/Chest: Effort normal and breath sounds normal. No respiratory distress. She has no wheezes. She has no rales.  Abdominal: Soft. Bowel sounds are normal. She exhibits no distension and no mass. There is no tenderness. There is no rebound and no guarding.  Musculoskeletal: Normal range of motion. She exhibits tenderness. She exhibits no edema.  Tenderness to palpation to the paraspinal lower lumbar region diffusely with no thoracic or lumbar spinal tenderness.  Pain worse with standing and flexing/extending the back.  Negative straight leg raise.  Strength 5/5 in the lower extremities bilaterally.  Patient able to ambulate without difficulty or ataxia.    Neurological: She is alert and oriented to person, place, and time.  Patellar reflexes intact  Skin: Skin is warm and dry. She is not diaphoretic.  Psychiatric: Her behavior is normal.    ED Course  Procedures (including critical care time)]  DIAGNOSTIC STUDIES: Oxygen Saturation is 97% on room air, normal by my interpretation.    COORDINATION OF CARE: 11:46 PM -Will order blood lipase, urinalysis, CBC, and CMP. Patient verbalizes understanding and agrees with treatment plan.  Labs Review Labs Reviewed - No data to display Imaging Review No results found.  EKG Interpretation   None      Results for orders placed during the hospital encounter of 09/23/13  URINE CULTURE      Result Value Range    Specimen Description URINE, CLEAN CATCH     Special Requests CX ADDED AT 0220 ON 161096     Culture  Setup Time       Value: 09/24/2013 02:30     Performed at [facility] Count       Value: NO GROWTH     Performed at [facility]   Culture       Value: NO GROWTH     Performed at [facility]   Report Status 09/25/2013 FINAL    CBC WITH DIFFERENTIAL      Result Value Range   WBC 9.3  4.0 - 10.5 K/uL   RBC 4.33  3.87 - 5.11 MIL/uL   Hemoglobin 13.7  12.0 - 15.0 g/dL   HCT 04.5  40.9 - 81.1 %   MCV 92.6  78.0 -  100.0 fL   MCH 31.6  26.0 - 34.0 pg   MCHC 34.2  30.0 - 36.0 g/dL   RDW 04.5  40.9 - 81.1 %   Platelets 216  150 - 400 K/uL   Neutrophils Relative % 61  43 - 77 %   Neutro Abs 5.7  1.7 - 7.7 K/uL   Lymphocytes Relative 32  12 - 46 %   Lymphs Abs 3.0  0.7 - 4.0 K/uL   Monocytes Relative 6  3 - 12 %   Monocytes Absolute 0.6  0.1 - 1.0 K/uL   Eosinophils Relative 1  0 - 5 %   Eosinophils Absolute 0.1  0.0 - 0.7 K/uL   Basophils Relative 0  0 - 1 %   Basophils Absolute 0.0  0.0 - 0.1 K/uL  COMPREHENSIVE METABOLIC PANEL      Result Value Range   Sodium 141  135 - 145 mEq/L   Potassium 3.7  3.5 - 5.1 mEq/L   Chloride 105  96 - 112 mEq/L   CO2 27  19 - 32 mEq/L   Glucose, Bld 113 (*) 70 - 99 mg/dL   BUN 15  6 - 23 mg/dL   Creatinine, Ser 9.14  0.50 - 1.10 mg/dL   Calcium 9.4  8.4 - 78.2 mg/dL   Total Protein 7.3  6.0 - 8.3 g/dL   Albumin 3.8  3.5 - 5.2 g/dL   AST 18  0 - 37 U/L   ALT 25  0 - 35 U/L   Alkaline Phosphatase 61  39 - 117 U/L   Total Bilirubin 0.2 (*) 0.3 - 1.2 mg/dL   GFR calc non Af Amer 74 (*) >90 mL/min   GFR calc Af Amer 85 (*) >90 mL/min  LIPASE, BLOOD      Result Value Range   Lipase 32  11 - 59 U/L  URINALYSIS, ROUTINE W REFLEX MICROSCOPIC      Result Value Range   Color, Urine YELLOW  YELLOW   APPearance CLEAR  CLEAR   Specific Gravity, Urine 1.022  1.005 - 1.030   pH 6.5  5.0 - 8.0   Glucose, UA NEGATIVE   NEGATIVE mg/dL   Hgb urine dipstick NEGATIVE  NEGATIVE   Bilirubin Urine NEGATIVE  NEGATIVE   Ketones, ur NEGATIVE  NEGATIVE mg/dL   Protein, ur NEGATIVE  NEGATIVE mg/dL   Urobilinogen, UA 1.0  0.0 - 1.0 mg/dL   Nitrite NEGATIVE  NEGATIVE   Leukocytes, UA MODERATE (*) NEGATIVE  URINE MICROSCOPIC-ADD ON      Result Value Range   Squamous Epithelial / LPF FEW (*) RARE   WBC, UA 7-10  <3 WBC/hpf   RBC / HPF 0-2  <3 RBC/hpf   Bacteria, UA FEW (*) RARE    MDM   CHRISANNE LOOSE is a 57 y.o. female with a PMH of HTN and pancreatitis who presents to the Emergency Department complaining of intermittent lower back pain that began three days ago.    Rechecks  2:30 AM = Informed patient of results.  Patient denies pain.     Etiology of back pain possibly due to a lumbar sprain/strain vs UTI.  Pain worse with standing, palpation, and movement.  Patient denied any injuries, falls, or trauma.  Patient neurovascularly intact.  Labs negative for pancreatitis or leukocytosis.  Her urine was suggestive of a UTI and patient will be treated with Keflex.  Urine sent for culture.  Patient had lower back pain with  radiation to the upper abdomen.  Her abdominal exam was benign and non-surgical.  She was admitted about 6 months ago for abdominal pain, which was evaluated with CT scan and colonoscopy.  She was found to have diverticulitis at that time.  She has no lower abdominal pain today and her symptoms today are not similar.  Patient non-toxic and afebrile.  She was instructed to follow-up with a PCP and was given the resource guide.  Return precautions were discussed.  Patient in agreement with discharge and plan.     Discharge Medication List as of 09/24/2013  2:27 AM    START taking these medications   Details  cephALEXin (KEFLEX) 500 MG capsule Take 1 capsule (500 mg total) by mouth 2 (two) times daily., Starting 09/24/2013, Until Discontinued, Print        Final impressions: 1. Back pain   2.  UTI (urinary tract infection)       Greer Ee Soua Caltagirone PA-C           Jillyn Ledger, PA-C 09/26/13 1015

## 2013-09-24 LAB — LIPASE, BLOOD: Lipase: 32 U/L (ref 11–59)

## 2013-09-24 LAB — COMPREHENSIVE METABOLIC PANEL
AST: 18 U/L (ref 0–37)
Albumin: 3.8 g/dL (ref 3.5–5.2)
BUN: 15 mg/dL (ref 6–23)
Calcium: 9.4 mg/dL (ref 8.4–10.5)
Chloride: 105 mEq/L (ref 96–112)
Creatinine, Ser: 0.86 mg/dL (ref 0.50–1.10)
Total Protein: 7.3 g/dL (ref 6.0–8.3)

## 2013-09-24 LAB — CBC WITH DIFFERENTIAL/PLATELET
Basophils Absolute: 0 10*3/uL (ref 0.0–0.1)
Basophils Relative: 0 % (ref 0–1)
Eosinophils Absolute: 0.1 10*3/uL (ref 0.0–0.7)
Eosinophils Relative: 1 % (ref 0–5)
HCT: 40.1 % (ref 36.0–46.0)
MCH: 31.6 pg (ref 26.0–34.0)
MCHC: 34.2 g/dL (ref 30.0–36.0)
Monocytes Absolute: 0.6 10*3/uL (ref 0.1–1.0)
Neutro Abs: 5.7 10*3/uL (ref 1.7–7.7)
RDW: 12.8 % (ref 11.5–15.5)

## 2013-09-24 LAB — URINALYSIS, ROUTINE W REFLEX MICROSCOPIC
Glucose, UA: NEGATIVE mg/dL
Nitrite: NEGATIVE
Specific Gravity, Urine: 1.022 (ref 1.005–1.030)
pH: 6.5 (ref 5.0–8.0)

## 2013-09-24 LAB — URINE MICROSCOPIC-ADD ON

## 2013-09-24 MED ORDER — CEPHALEXIN 500 MG PO CAPS: 500.0000 mg | ORAL_CAPSULE | Freq: Two times a day (BID) | ORAL | Status: DC

## 2013-09-24 MED ORDER — CEPHALEXIN 250 MG PO CAPS
500.0000 mg | ORAL_CAPSULE | Freq: Once | ORAL | Status: AC
  Administered 2013-09-24: 500 mg via ORAL
  Filled 2013-09-24: qty 2

## 2013-09-25 LAB — URINE CULTURE

## 2013-09-27 NOTE — ED Provider Notes (Signed)
Medical screening examination/treatment/procedure(s) were performed by non-physician practitioner and as supervising physician I was immediately available for consultation/collaboration.  EKG Interpretation   None         [name], MD 09/27/13 1414

## 2013-09-30 ENCOUNTER — Telehealth (HOSPITAL_COMMUNITY): Payer: Self-pay | Admitting: Emergency Medicine

## 2013-10-15 ENCOUNTER — Emergency Department (HOSPITAL_COMMUNITY): Payer: Self-pay

## 2013-10-15 ENCOUNTER — Emergency Department (HOSPITAL_COMMUNITY)
Admission: EM | Admit: 2013-10-15 | Discharge: 2013-10-15 | Disposition: A | Discharge status: Home / Self Care | Payer: Self-pay | Attending: Emergency Medicine | Admitting: Emergency Medicine

## 2013-10-15 ENCOUNTER — Encounter (HOSPITAL_COMMUNITY): Payer: Self-pay | Admitting: Emergency Medicine

## 2013-10-15 DIAGNOSIS — F17200 Nicotine dependence, unspecified, uncomplicated: Secondary | ICD-10-CM | POA: Insufficient documentation

## 2013-10-15 DIAGNOSIS — R1084 Generalized abdominal pain: Secondary | ICD-10-CM | POA: Insufficient documentation

## 2013-10-15 DIAGNOSIS — Z9889 Other specified postprocedural states: Secondary | ICD-10-CM | POA: Insufficient documentation

## 2013-10-15 DIAGNOSIS — K5900 Constipation, unspecified: Secondary | ICD-10-CM | POA: Insufficient documentation

## 2013-10-15 DIAGNOSIS — I10 Essential (primary) hypertension: Secondary | ICD-10-CM | POA: Insufficient documentation

## 2013-10-15 HISTORY — DX: Diverticulitis of intestine, part unspecified, without perforation or abscess without bleeding: K57.92

## 2013-10-15 LAB — CBC WITH DIFFERENTIAL/PLATELET
Basophils Absolute: 0 10*3/uL (ref 0.0–0.1)
Basophils Relative: 0 % (ref 0–1)
Eosinophils Absolute: 0.1 10*3/uL (ref 0.0–0.7)
HCT: 43 % (ref 36.0–46.0)
Lymphocytes Relative: 30 % (ref 12–46)
MCH: 32.5 pg (ref 26.0–34.0)
MCHC: 35.1 g/dL (ref 30.0–36.0)
Monocytes Absolute: 0.7 10*3/uL (ref 0.1–1.0)
Monocytes Relative: 9 % (ref 3–12)
Neutro Abs: 5 10*3/uL (ref 1.7–7.7)
Neutrophils Relative %: 60 % (ref 43–77)
Platelets: 219 10*3/uL (ref 150–400)
RDW: 13.2 % (ref 11.5–15.5)
WBC: 8.4 10*3/uL (ref 4.0–10.5)

## 2013-10-15 LAB — COMPREHENSIVE METABOLIC PANEL
AST: 19 U/L (ref 0–37)
Albumin: 4.1 g/dL (ref 3.5–5.2)
BUN: 8 mg/dL (ref 6–23)
Chloride: 103 mEq/L (ref 96–112)
Creatinine, Ser: 0.67 mg/dL (ref 0.50–1.10)
Sodium: 143 mEq/L (ref 137–147)
Total Bilirubin: 0.3 mg/dL (ref 0.3–1.2)
Total Protein: 7.7 g/dL (ref 6.0–8.3)

## 2013-10-15 LAB — URINALYSIS, ROUTINE W REFLEX MICROSCOPIC
Bilirubin Urine: NEGATIVE
Glucose, UA: NEGATIVE mg/dL
Ketones, ur: NEGATIVE mg/dL
Leukocytes, UA: NEGATIVE
Nitrite: NEGATIVE
Specific Gravity, Urine: 1.015 (ref 1.005–1.030)
Urobilinogen, UA: 0.2 mg/dL (ref 0.0–1.0)
pH: 5 (ref 5.0–8.0)

## 2013-10-15 LAB — LIPASE, BLOOD: Lipase: 61 U/L — ABNORMAL HIGH (ref 11–59)

## 2013-10-15 MED ORDER — POLYETHYLENE GLYCOL 3350 17 GM/SCOOP PO POWD: 17.0000 g | Freq: Two times a day (BID) | ORAL | Status: DC

## 2013-10-15 NOTE — ED Notes (Signed)
Provider in room  

## 2013-10-15 NOTE — ED Notes (Signed)
Pt states that she had not had a bowel movement since Saturday and states that she took epsom salt yesterday and had a loose stool.

## 2013-10-15 NOTE — ED Provider Notes (Signed)
CSN: [id number]     Arrival date & time 10/15/13  1914 History   First MD Initiated Contact with Patient 10/15/13 [phone]     Chief Complaint  Patient presents with  . Abdominal Pain  . Constipation   (Consider location/radiation/quality/duration/timing/severity/associated sxs/prior Treatment) HPI Comments: Patient presents to the ED with a chief complaint of constipation x 5 days.  She states that she has not had a BM since Sunday.  She denies fevers, chills, focal abdominal pain, n/v/d, dysuria, and vaginal discharge.  She states that she tried taking epsom salt and had one episode of watery diarrhea.  She states that she thinks she is constipated from eating all of the food during the holidays.  She was admitted earlier this year for diverticulitis which was found on CT.  The CT also showed a lesion on the pancreas, but follow-up was suggested in 1 year.  The history is provided by the patient. No language interpreter was used.    Past Medical History  Diagnosis Date  . Pancreatitis   . Hypertension     not on medications  . Diverticulitis    Past Surgical History  Procedure Laterality Date  . Flexible sigmoidoscopy N/A 02/26/2013    Procedure: FLEXIBLE SIGMOIDOSCOPY;  Surgeon: Lindsey Belfast, MD;  Location: [facility] ENDOSCOPY;  Service: Endoscopy;  Laterality: N/A;   Family History  Problem Relation Age of Onset  . Cancer Mother     passed away. unknown age  . Healthy Father   . Hypertension Sister   . Diabetes Mellitus II Sister    History  Substance Use Topics  . Smoking status: Current Every Day Smoker -- 1.00 packs/day for 30 years    Types: Cigarettes  . Smokeless tobacco: Current User    Types: Chew  . Alcohol Use: Yes     Comment: drink 48 oz daily at weeak days and 80 oz daily on weekend. last drink on 02/23/13   OB History   Grav Para Term Preterm Abortions TAB SAB Ect Mult Living                 Review of Systems  All other systems reviewed and are  negative.    Allergies  Review of patient's allergies indicates no known allergies.  Home Medications  No current outpatient prescriptions on file. BP 156/76  Pulse 96  Temp(Src) 97.9 F (36.6 C) (Oral)  Resp 16  SpO2 97% Physical Exam  Nursing note and vitals reviewed. Constitutional: She is oriented to person, place, and time. She appears well-developed and well-nourished.  HENT:  Head: Normocephalic and atraumatic.  Eyes: Conjunctivae and EOM are normal. Pupils are equal, round, and reactive to light.  Neck: Normal range of motion. Neck supple.  Cardiovascular: Normal rate and regular rhythm.  Exam reveals no gallop and no friction rub.   No murmur heard. Pulmonary/Chest: Effort normal and breath sounds normal. No respiratory distress. She has no wheezes. She has no rales. She exhibits no tenderness.  Abdominal: Soft. She exhibits no distension and no mass. There is no tenderness. There is no rebound and no guarding.  No focal abdominal tenderness  Musculoskeletal: Normal range of motion. She exhibits no edema and no tenderness.  Neurological: She is alert and oriented to person, place, and time.  Skin: Skin is warm and dry.  Psychiatric: She has a normal mood and affect. Her behavior is normal. Judgment and thought content normal.    ED Course  Procedures (including critical care time)  Results for orders placed during the hospital encounter of 10/15/13  CBC WITH DIFFERENTIAL      Result Value Range   WBC 8.4  4.0 - 10.5 K/uL   RBC 4.65  3.87 - 5.11 MIL/uL   Hemoglobin 15.1 (*) 12.0 - 15.0 g/dL   HCT 40.9  81.1 - 91.4 %   MCV 92.5  78.0 - 100.0 fL   MCH 32.5  26.0 - 34.0 pg   MCHC 35.1  30.0 - 36.0 g/dL   RDW 78.2  95.6 - 21.3 %   Platelets 219  150 - 400 K/uL   Neutrophils Relative % 60  43 - 77 %   Neutro Abs 5.0  1.7 - 7.7 K/uL   Lymphocytes Relative 30  12 - 46 %   Lymphs Abs 2.5  0.7 - 4.0 K/uL   Monocytes Relative 9  3 - 12 %   Monocytes Absolute 0.7  0.1  - 1.0 K/uL   Eosinophils Relative 1  0 - 5 %   Eosinophils Absolute 0.1  0.0 - 0.7 K/uL   Basophils Relative 0  0 - 1 %   Basophils Absolute 0.0  0.0 - 0.1 K/uL  COMPREHENSIVE METABOLIC PANEL      Result Value Range   Sodium 143  137 - 147 mEq/L   Potassium 4.1  3.7 - 5.3 mEq/L   Chloride 103  96 - 112 mEq/L   CO2 25  19 - 32 mEq/L   Glucose, Bld 124 (*) 70 - 99 mg/dL   BUN 8  6 - 23 mg/dL   Creatinine, Ser 0.86  0.50 - 1.10 mg/dL   Calcium 9.4  8.4 - 57.8 mg/dL   Total Protein 7.7  6.0 - 8.3 g/dL   Albumin 4.1  3.5 - 5.2 g/dL   AST 19  0 - 37 U/L   ALT 22  0 - 35 U/L   Alkaline Phosphatase 65  39 - 117 U/L   Total Bilirubin 0.3  0.3 - 1.2 mg/dL   GFR calc non Af Amer >90  >90 mL/min   GFR calc Af Amer >90  >90 mL/min  LIPASE, BLOOD      Result Value Range   Lipase 61 (*) 11 - 59 U/L  URINALYSIS, ROUTINE W REFLEX MICROSCOPIC      Result Value Range   Color, Urine YELLOW  YELLOW   APPearance CLOUDY (*) CLEAR   Specific Gravity, Urine 1.015  1.005 - 1.030   pH 5.0  5.0 - 8.0   Glucose, UA NEGATIVE  NEGATIVE mg/dL   Hgb urine dipstick NEGATIVE  NEGATIVE   Bilirubin Urine NEGATIVE  NEGATIVE   Ketones, ur NEGATIVE  NEGATIVE mg/dL   Protein, ur NEGATIVE  NEGATIVE mg/dL   Urobilinogen, UA 0.2  0.0 - 1.0 mg/dL   Nitrite NEGATIVE  NEGATIVE   Leukocytes, UA NEGATIVE  NEGATIVE   Dg Abd Acute W/chest  10/15/2013   CLINICAL DATA:  Mid abdominal pain.  EXAM: ACUTE ABDOMEN SERIES (ABDOMEN 2 VIEW & CHEST 1 VIEW)  COMPARISON:  CT abdomen and pelvis 02/24/2013  FINDINGS: There is no evidence of dilated bowel loops or free intraperitoneal air. No radiopaque calculi or other significant radiographic abnormality is seen. Heart size and mediastinal contours are within normal limits. Both lungs are clear.  IMPRESSION: Negative abdominal radiographs.  No acute cardiopulmonary disease.   Electronically Signed   By: Lindsey Leon M.D.   On: 10/15/2013 07:00     EKG  Interpretation   None        MDM   1. Constipation    Patient with constipation since Sunday.  She states that she gets waves of abdominal pain.  Plain films and labs are reassuring.  Patient is well-appearing, and not in any apparent distress.  Will discharge to home with PCP follow-up and will give miralax.  Patient seen by and discussed with Dr. Bebe Shaggy, who agrees with the plan.    Roxy Horseman, PA-C 10/15/13 [phone]

## 2013-10-15 NOTE — ED Provider Notes (Signed)
Patient seen/examined in the Emergency Department in conjunction with Midlevel Provider [place] Patient reports constipation Exam : no distress, abd soft Plan: stable for d/c for outpatient treatment of constipation   Joya Gaskins, MD 10/15/13 [phone]

## 2013-10-15 NOTE — ED Provider Notes (Signed)
Medical screening examination/treatment/procedure(s) were conducted as a shared visit with non-physician practitioner(s) and myself.  I personally evaluated the patient during the encounter.  EKG Interpretation   None         Joya Gaskins, MD 10/15/13 2309

## 2013-10-15 NOTE — ED Notes (Signed)
Pt. reports generalized abdominal pain with constipation for 5 days , denies nausea or vomitting , no fever or chills.

## 2014-05-23 ENCOUNTER — Emergency Department (HOSPITAL_COMMUNITY): Payer: BC Managed Care – PPO

## 2014-05-23 ENCOUNTER — Inpatient Hospital Stay (HOSPITAL_COMMUNITY)
Admission: EM | Admit: 2014-05-23 | Discharge: 2014-05-26 | DRG: 064 | Disposition: A | Discharge status: Home / Self Care | Payer: BC Managed Care – PPO | Attending: Neurology | Admitting: Neurology

## 2014-05-23 ENCOUNTER — Encounter (HOSPITAL_COMMUNITY): Payer: Self-pay | Admitting: Emergency Medicine

## 2014-05-23 DIAGNOSIS — R4701 Aphasia: Secondary | ICD-10-CM | POA: Diagnosis present

## 2014-05-23 DIAGNOSIS — R4789 Other speech disturbances: Secondary | ICD-10-CM | POA: Diagnosis present

## 2014-05-23 DIAGNOSIS — F1210 Cannabis abuse, uncomplicated: Secondary | ICD-10-CM | POA: Diagnosis present

## 2014-05-23 DIAGNOSIS — F29 Unspecified psychosis not due to a substance or known physiological condition: Secondary | ICD-10-CM | POA: Diagnosis present

## 2014-05-23 DIAGNOSIS — F17200 Nicotine dependence, unspecified, uncomplicated: Secondary | ICD-10-CM | POA: Diagnosis present

## 2014-05-23 DIAGNOSIS — F329 Major depressive disorder, single episode, unspecified: Secondary | ICD-10-CM | POA: Diagnosis present

## 2014-05-23 DIAGNOSIS — F3289 Other specified depressive episodes: Secondary | ICD-10-CM | POA: Diagnosis present

## 2014-05-23 DIAGNOSIS — R410 Disorientation, unspecified: Secondary | ICD-10-CM

## 2014-05-23 DIAGNOSIS — W19XXXA Unspecified fall, initial encounter: Secondary | ICD-10-CM | POA: Diagnosis present

## 2014-05-23 DIAGNOSIS — S0990XA Unspecified injury of head, initial encounter: Secondary | ICD-10-CM | POA: Diagnosis present

## 2014-05-23 DIAGNOSIS — I10 Essential (primary) hypertension: Secondary | ICD-10-CM | POA: Diagnosis present

## 2014-05-23 DIAGNOSIS — Z9119 Patient's noncompliance with other medical treatment and regimen: Secondary | ICD-10-CM

## 2014-05-23 DIAGNOSIS — G936 Cerebral edema: Secondary | ICD-10-CM | POA: Diagnosis present

## 2014-05-23 DIAGNOSIS — Z91199 Patient's noncompliance with other medical treatment and regimen due to unspecified reason: Secondary | ICD-10-CM

## 2014-05-23 DIAGNOSIS — I619 Nontraumatic intracerebral hemorrhage, unspecified: Principal | ICD-10-CM | POA: Diagnosis present

## 2014-05-23 LAB — URINALYSIS, ROUTINE W REFLEX MICROSCOPIC
Bilirubin Urine: NEGATIVE
Glucose, UA: NEGATIVE mg/dL
HGB URINE DIPSTICK: NEGATIVE
Ketones, ur: NEGATIVE mg/dL
Nitrite: NEGATIVE
PROTEIN: 100 mg/dL — AB
Specific Gravity, Urine: 1.014 (ref 1.005–1.030)
UROBILINOGEN UA: 1 mg/dL (ref 0.0–1.0)
pH: 7.5 (ref 5.0–8.0)

## 2014-05-23 LAB — COMPREHENSIVE METABOLIC PANEL
ALT: 19 U/L (ref 0–35)
ANION GAP: 17 — AB (ref 5–15)
AST: 20 U/L (ref 0–37)
Albumin: 4.6 g/dL (ref 3.5–5.2)
Alkaline Phosphatase: 71 U/L (ref 39–117)
BILIRUBIN TOTAL: 0.5 mg/dL (ref 0.3–1.2)
BUN: 5 mg/dL — AB (ref 6–23)
CO2: 24 mEq/L (ref 19–32)
CREATININE: 0.7 mg/dL (ref 0.50–1.10)
Calcium: 10 mg/dL (ref 8.4–10.5)
Chloride: 97 mEq/L (ref 96–112)
GFR calc Af Amer: >90 mL/min (ref >90)
GFR calc non Af Amer: >90 mL/min (ref >90)
Glucose, Bld: 116 mg/dL — ABNORMAL HIGH (ref 70–99)
Potassium: 3.7 mEq/L (ref 3.7–5.3)
Sodium: 138 mEq/L (ref 137–147)
TOTAL PROTEIN: 8.6 g/dL — AB (ref 6.0–8.3)

## 2014-05-23 LAB — URINE MICROSCOPIC-ADD ON

## 2014-05-23 LAB — MRSA PCR SCREENING: MRSA by PCR: NEGATIVE

## 2014-05-23 LAB — CBC
HEMATOCRIT: 45.3 % (ref 36.0–46.0)
Hemoglobin: 15.9 g/dL — ABNORMAL HIGH (ref 12.0–15.0)
MCH: 31.4 pg (ref 26.0–34.0)
MCHC: 35.1 g/dL (ref 30.0–36.0)
MCV: 89.3 fL (ref 78.0–100.0)
Platelets: 192 10*3/uL (ref 150–400)
RBC: 5.07 MIL/uL (ref 3.87–5.11)
RDW: 13.2 % (ref 11.5–15.5)
WBC: 7 10*3/uL (ref 4.0–10.5)

## 2014-05-23 LAB — RAPID URINE DRUG SCREEN, HOSP PERFORMED
Amphetamines: NOT DETECTED
BARBITURATES: NOT DETECTED
Benzodiazepines: NOT DETECTED
Cocaine: NOT DETECTED
Opiates: NOT DETECTED
TETRAHYDROCANNABINOL: POSITIVE — AB

## 2014-05-23 LAB — PROTIME-INR
INR: 0.98 (ref 0.00–1.49)
PROTHROMBIN TIME: 13 s (ref 11.6–15.2)

## 2014-05-23 LAB — CBG MONITORING, ED: Glucose-Capillary: 124 mg/dL — ABNORMAL HIGH (ref 70–99)

## 2014-05-23 MED ORDER — ACETAMINOPHEN 650 MG RE SUPP: 650.0000 mg | RECTAL | Status: DC | PRN

## 2014-05-23 MED ORDER — PANTOPRAZOLE SODIUM 40 MG IV SOLR
40.0000 mg | Freq: Every day | INTRAVENOUS | Status: DC
  Administered 2014-05-23: 40 mg via INTRAVENOUS
  Filled 2014-05-23 (×2): qty 40

## 2014-05-23 MED ORDER — STROKE: EARLY STAGES OF RECOVERY BOOK
Freq: Once | Status: DC
  Filled 2014-05-23: qty 1

## 2014-05-23 MED ORDER — SENNOSIDES-DOCUSATE SODIUM 8.6-50 MG PO TABS
1.0000 | ORAL_TABLET | Freq: Two times a day (BID) | ORAL | Status: DC
  Administered 2014-05-24 – 2014-05-25 (×4): 1 via ORAL
  Filled 2014-05-23 (×7): qty 1

## 2014-05-23 MED ORDER — ACETAMINOPHEN 325 MG PO TABS
650.0000 mg | ORAL_TABLET | ORAL | Status: DC | PRN
  Administered 2014-05-24: 650 mg via ORAL
  Filled 2014-05-23: qty 2

## 2014-05-23 MED ORDER — NICARDIPINE HCL IN NACL 20-0.86 MG/200ML-% IV SOLN
5.0000 mg/h | Freq: Once | INTRAVENOUS | Status: AC
  Administered 2014-05-23: 5 mg/h via INTRAVENOUS
  Filled 2014-05-23: qty 200

## 2014-05-23 MED ORDER — NICARDIPINE HCL IN NACL 20-0.86 MG/200ML-% IV SOLN
3.0000 mg/h | INTRAVENOUS | Status: DC
Start: 2014-05-23 — End: 2014-05-23
  Administered 2014-05-23: 10 mg/h via INTRAVENOUS
  Administered 2014-05-23: 6 mg/h via INTRAVENOUS
  Filled 2014-05-23 (×2): qty 200

## 2014-05-23 MED ORDER — NICARDIPINE HCL IN NACL 40-0.83 MG/200ML-% IV SOLN
3.0000 mg/h | INTRAVENOUS | Status: DC
  Administered 2014-05-23 – 2014-05-24 (×3): 10 mg/h via INTRAVENOUS
  Filled 2014-05-23 (×3): qty 200

## 2014-05-23 NOTE — ED Notes (Signed)
Neurologist at bedside to consult. 

## 2014-05-23 NOTE — ED Notes (Signed)
Attempted to call report, nurse to call back. 

## 2014-05-23 NOTE — ED Notes (Signed)
Pt in with family reporting altered mental status since an unknown time this morning, family states "early, maybe around 9am" which is when family was first alerted to symptoms, patient unsure if she woke up with symptoms, family states patient is not acting like herself, normal movement of all extremities with no facial droop noted
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
## 2014-05-24 ENCOUNTER — Encounter (HOSPITAL_COMMUNITY): Payer: Self-pay | Admitting: *Deleted

## 2014-05-24 ENCOUNTER — Inpatient Hospital Stay (HOSPITAL_COMMUNITY): Payer: BC Managed Care – PPO

## 2014-05-24 DIAGNOSIS — I359 Nonrheumatic aortic valve disorder, unspecified: Secondary | ICD-10-CM

## 2014-05-24 DIAGNOSIS — R6889 Other general symptoms and signs: Secondary | ICD-10-CM

## 2014-05-24 MED ORDER — PANTOPRAZOLE SODIUM 40 MG PO TBEC
40.0000 mg | DELAYED_RELEASE_TABLET | Freq: Every day | ORAL | Status: DC
  Administered 2014-05-24 – 2014-05-26 (×3): 40 mg via ORAL
  Filled 2014-05-24 (×2): qty 1

## 2014-05-24 MED ORDER — HYDROCHLOROTHIAZIDE 12.5 MG PO CAPS
12.5000 mg | ORAL_CAPSULE | Freq: Every day | ORAL | Status: DC
  Administered 2014-05-24 – 2014-05-26 (×3): 12.5 mg via ORAL
  Filled 2014-05-24 (×3): qty 1

## 2014-05-24 NOTE — Progress Notes (Signed)
*  PRELIMINARY RESULTS* Echocardiogram 2D Echocardiogram has been performed.  Lindsey Leon, Lindsey Leon 05/24/2014, 3:48 PM

## 2014-05-24 NOTE — Progress Notes (Signed)
SLP Cancellation Note  Patient Details Name: Eulas PostLinda M Garciaperez MRN: [id number] DOB: [birth year]   Cancelled treatment:       Reason Eval/Treat Not Completed: Patient at procedure or test/unavailable  Ferdinand LangoLeah Zaviyar Rahal MA, CCC-SLP [phone]    Travius Crochet Meryl 05/24/2014, 1:30 PM

## 2014-05-24 NOTE — Progress Notes (Signed)
PT Cancellation Note  Patient Details Name: Lindsey Leon MRN: [id number] DOB: [birth year]   Cancelled Treatment:    Reason Eval/Treat Not Completed: Patient not medically ready (bedrest orders at this time, will follow)   Fabio AsaWerner, Izan Miron J 05/24/2014, 8:30 AM Charlotte Crumbevon Velta Rockholt, PT DPT  [phone]
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
## 2014-05-24 NOTE — Evaluation (Signed)
Physical Therapy Evaluation Patient Details Name: Lindsey Leon MRN: [id number] DOB: 1955/11/29 Today's Date: 05/24/2014   History of Present Illness  58 yo F with left temporal hemorrhage and resultant aphasia. She has very high BP. Workup underway.  Clinical Impression  Patient demonstrates deficits in mobility as indicated below. Will benefit from continued skilled PT to address deficits and maximize function and balance. Will see as indicated and progress as tolerated. Patient educated extensively regarding stroke symptoms secondary to prior reported symptoms of numbness, tingling LUE and visual changes reported in past month.     Follow Up Recommendations No PT follow up    Equipment Recommendations  None recommended by PT    Recommendations for Other Services       Precautions / Restrictions Precautions Precautions: Fall Restrictions Weight Bearing Restrictions: No      Mobility  Bed Mobility Overal bed mobility: Modified Independent                Transfers Overall transfer level: Supervision                    Ambulation/Gait Ambulation/Gait assistance: Min guard Ambulation Distance (Feet): 180 Feet Assistive device: None Gait Pattern/deviations: Step-through pattern;Decreased stride length;Narrow base of support Gait velocity: decreased Gait velocity interpretation: Below normal speed for age/gender General Gait Details: some instability noted with ambulation  Stairs            Wheelchair Mobility    Modified Rankin (Stroke Patients Only) Modified Rankin (Stroke Patients Only) Pre-Morbid Rankin Score: No symptoms Modified Rankin: Moderate disability     Balance                                 Standardized Balance Assessment Standardized Balance Assessment : Dynamic Gait Index   Dynamic Gait Index Level Surface: Mild Impairment Change in Gait Speed: Moderate Impairment Gait with Horizontal Head Turns: Mild  Impairment Gait with Vertical Head Turns: Mild Impairment Gait and Pivot Turn: Mild Impairment Step Over Obstacle: Mild Impairment Step Around Obstacles: Mild Impairment Steps: Mild Impairment Total Score: 15       Pertinent Vitals/Pain Pain Assessment: No/denies pain    Home Living Family/patient expects to be discharged to:: Private residence Living Arrangements: Alone Available Help at Discharge: Family Type of Home: Apartment Home Access: Elevator     Home Layout: One level Home Equipment: None      Prior Function Level of Independence: Independent               Hand Dominance   Dominant Hand: Right    Extremity/Trunk Assessment   Upper Extremity Assessment: Generalized weakness           Lower Extremity Assessment: Overall WFL for tasks assessed         Communication   Communication: Expressive difficulties  Cognition Arousal/Alertness: Awake/alert Behavior During Therapy: WFL for tasks assessed/performed                        General Comments      Exercises        Assessment/Plan    PT Assessment Patient needs continued PT services  PT Diagnosis Difficulty walking;Abnormality of gait   PT Problem List Decreased activity tolerance;Decreased balance;Decreased mobility  PT Treatment Interventions DME instruction;Gait training;Functional mobility training;Therapeutic activities;Therapeutic exercise;Balance training;Patient/family education   PT Goals (Current goals can be found in the Care  Plan section) Acute Rehab PT Goals Patient Stated Goal: to go home PT Goal Formulation: With patient Time For Goal Achievement: 06/07/14 Potential to Achieve Goals: Good    Frequency Min 4X/week   Barriers to discharge Decreased caregiver support      Co-evaluation               End of Session Equipment Utilized During Treatment: Gait belt Activity Tolerance: Patient tolerated treatment well Patient left: in bed;with call  bell/phone within reach Nurse Communication: Mobility status         Time: [id number] PT Time Calculation (min): 26 min   Charges:   PT Evaluation $Initial PT Evaluation Tier I: 1 Procedure PT Treatments $Gait Training: 8-22 mins $Self Care/Home Management: 8-22   PT G CodesFabio Asa:          Clide Remmers J 05/24/2014, 11:44 AM

## 2014-05-24 NOTE — Progress Notes (Signed)
VASCULAR LAB PRELIMINARY  PRELIMINARY  PRELIMINARY  PRELIMINARY  Carotid Dopplers completed.    Preliminary report:  There is right 60-79% ICA stenosis, highest end of range.  There is 1-39% left ICA stenosis.  Bilateral vertebral artery flow is antegrade.  Lindsey Leon, RVT 05/24/2014, 11:21 AM

## 2014-05-25 LAB — LIPID PANEL
CHOLESTEROL: 126 mg/dL (ref 0–200)
HDL: 32 mg/dL — AB (ref >39)
LDL Cholesterol: 63 mg/dL (ref 0–99)
TRIGLYCERIDES: 153 mg/dL — AB (ref <150)
Total CHOL/HDL Ratio: 3.9 RATIO
VLDL: 31 mg/dL (ref 0–40)

## 2014-05-25 LAB — HEMOGLOBIN A1C
Hgb A1c MFr Bld: 5.8 % — ABNORMAL HIGH (ref <5.7)
Mean Plasma Glucose: 120 mg/dL — ABNORMAL HIGH (ref <117)

## 2014-05-25 NOTE — Evaluation (Signed)
Speech Language Pathology Evaluation Patient Details Name: Lindsey Leon MRN: [id number] DOB: February 09, 1956 Today's Date: 05/25/2014 Time: [id number] SLP Time Calculation (min): 22 min  Problem List:  Patient Active Problem List   Diagnosis Date Noted  . ICH (intracerebral hemorrhage) 05/23/2014  . Diverticulitis of colon 02/24/2013  . Pancreatitis, Hx of 02/24/2013  . Alcohol abuse 02/24/2013   Past Medical History:  Past Medical History  Diagnosis Date  . Pancreatitis   . Hypertension     not on medications  . Diverticulitis    Past Surgical History:  Past Surgical History  Procedure Laterality Date  . Flexible sigmoidoscopy N/A 02/26/2013    Procedure: FLEXIBLE SIGMOIDOSCOPY;  Surgeon: Theda BelfastPatrick D Hung, MD;  [facility];  Service: Endoscopy;  Laterality: N/A;   HPI:  58 yo F with left temporal hemorrhage and resultant aphasia.    Assessment / Plan / Recommendation Clinical Impression  Cognitive-linguistic evaluation complete. Cognitive-linguistic function largely returned to baseline with the exception of very mild residuals expressive aphasia vs motor speech deficit. One verbal expression error noted at the conversation level characterized by phonemic substitutions, requiring clinician verbal cueing for correction. Otherwise, patient able to fully make needs known, follow commands, and demonstrate appropriate cognitive abilitites for return to independent living. Defer f/u SLP services to [facility] SLP should symptoms not continue to resolve prior to d/c. Patient educated regarding above and in agreement with plan.     SLP Assessment  All further Speech Lanaguage Pathology  needs can be addressed in the next venue of care    Follow Up Recommendations  Home health SLP       Pertinent Vitals/Pain Pain Assessment: No/denies pain   SLP Goals     SLP Evaluation Prior Functioning  Cognitive/Linguistic Baseline: Within functional limits Type of Home:  Apartment Available Help at Discharge: Family   Cognition  Overall Cognitive Status: Within Functional Limits for tasks assessed Orientation Level: Oriented X4    Comprehension  Auditory Comprehension Overall Auditory Comprehension: Appears within functional limits for tasks assessed Visual Recognition/Discrimination Discrimination: Within Function Limits Reading Comprehension Reading Status: Within funtional limits    Expression Expression Primary Mode of Expression: Verbal Verbal Expression Overall Verbal Expression: Other (comment) (very mild expressive aphasia vs motor speech deficit) Written Expression Dominant Hand: Right   Oral / Motor Oral Motor/Sensory Function Overall Oral Motor/Sensory Function: Appears within functional limits for tasks assessed Motor Speech Overall Motor Speech:  (see clinical impression)   GO    Ferdinand LangoLeah Richy Spradley MA, CCC-SLP [phone]  Lindsey Leon Lindsey Leon 05/25/2014, 10:40 AM

## 2014-05-25 NOTE — Progress Notes (Signed)
Physical Therapy Treatment Patient Details Name: Lindsey Leon MRN: [id number] DOB: Jun 04, 1956 Today's Date: 10-Jun-2014    History of Present Illness 58 yo F with left temporal hemorrhage and resultant aphasia. She has very high BP. Workup underway.    PT Comments    Patient tolerated session well, performed stair negotiation as well as ambulation. All goals met, no further Pt goals at this time, will sign off. Patient in agreement.  Follow Up Recommendations  No PT follow up     Equipment Recommendations  None recommended by PT    Recommendations for Other Services       Precautions / Restrictions Precautions Precautions: None Restrictions Weight Bearing Restrictions: No    Mobility  Bed Mobility Overal bed mobility: Independent                Transfers Overall transfer level: Independent Equipment used: None             General transfer comment: Pt with no LOB with mobility or transfers.  Ambulation/Gait Ambulation/Gait assistance: Independent Ambulation Distance (Feet): 410 Feet Assistive device: None           Stairs Stairs: Yes Stairs assistance: Modified independent (Device/Increase time) Stair Management: One rail Right;Alternating pattern Number of Stairs: 12    Wheelchair Mobility    Modified Rankin (Stroke Patients Only) Modified Rankin (Stroke Patients Only) Pre-Morbid Rankin Score: No symptoms Modified Rankin: Moderate disability     Balance Overall balance assessment: Needs assistance   Sitting balance-Leahy Scale: Normal       Standing balance-Leahy Scale: Normal Standing balance comment: Pt with decreased ability to stand in tandem but demonstrates adequate stepping strategies during selfcare tasks.                    Cognition Arousal/Alertness: Awake/alert Behavior During Therapy: WFL for tasks assessed/performed Overall Cognitive Status: Within Functional Limits for tasks assessed                      Exercises      General Comments        Pertinent Vitals/Pain Pain Assessment: No/denies pain    Home Living Family/patient expects to be discharged to:: Private residence Living Arrangements: Alone Available Help at Discharge: Family Type of Home: Apartment Home Access: Elevator   Home Layout: One level Home Equipment: None      Prior Function Level of Independence: Independent          PT Goals (current goals can now be found in the care plan section) Acute Rehab PT Goals Patient Stated Goal: to go home PT Goal Formulation: With patient Time For Goal Achievement: 06/07/14 Potential to Achieve Goals: Good Progress towards PT goals: Goals met/education completed, patient discharged from PT    Frequency  Min 4X/week    PT Plan      Co-evaluation             End of Session Equipment Utilized During Treatment: Gait belt Activity Tolerance: Patient tolerated treatment well Patient left: in chair;with call bell/phone within reach     Time: 1210-1229 PT Time Calculation (min): 19 min  Charges:  $Gait Training: 8-22 mins                    G CodesDuncan Leon Jun 10, 2014, 12:35 PM Lindsey Leon, [place] DPT  [phone]

## 2014-05-25 NOTE — Evaluation (Signed)
Occupational Therapy Evaluation Patient Details Name: Lindsey PostLinda M Willcutt MRN: [id number] DOB: 1955/10/19 Today's Date: 05/25/2014    History of Present Illness 58 yo F with left temporal hemorrhage and resultant aphasia. She has very high BP. Workup underway.   Clinical Impression   Pt currently is independent with selfcare tasks sit to stand and for functional transfers related to selfcare tasks.  Feel she is close to baseline for ADL function.  No further OT needs at this time.     Follow Up Recommendations  No OT follow up    Equipment Recommendations  None recommended by OT       Precautions / Restrictions Precautions Precautions: None Restrictions Weight Bearing Restrictions: No      Mobility Bed Mobility Overal bed mobility: Independent                Transfers Overall transfer level: Independent Equipment used: None             General transfer comment: Pt with no LOB with mobility or transfers.    Balance Overall balance assessment: Needs assistance   Sitting balance-Leahy Scale: Normal       Standing balance-Leahy Scale: Normal Standing balance comment: Pt with decreased ability to stand in tandem but demonstrates adequate stepping strategies during selfcare tasks.                            ADL Overall ADL's : At baseline                                             Vision Eye Alignment: Within Functional Limits Alignment/Gaze Preference: Within Defined Limits Ocular Range of Motion: Within Functional Limits Tracking/Visual Pursuits: Able to track stimulus in all quads without difficulty Saccades: Within functional limits Convergence: Within functional limits         Perception Perception Perception Tested?: Yes   Praxis Praxis Praxis tested?: Within functional limits    Pertinent Vitals/Pain Pain Assessment: No/denies pain     Hand Dominance Right   Extremity/Trunk Assessment Upper Extremity  Assessment Upper Extremity Assessment: Overall WFL for tasks assessed   Lower Extremity Assessment Lower Extremity Assessment: Defer to PT evaluation   Cervical / Trunk Assessment Cervical / Trunk Assessment: Normal   Communication Communication Communication: Expressive difficulties   Cognition Arousal/Alertness: Awake/alert Behavior During Therapy: WFL for tasks assessed/performed Overall Cognitive Status: Within Functional Limits for tasks assessed                                Home Living Family/patient expects to be discharged to:: Private residence Living Arrangements: Alone Available Help at Discharge: Family Type of Home: Apartment Home Access: Elevator     Home Layout: One level     Bathroom Shower/Tub: Tub/shower unit Shower/tub characteristics: Engineer, building servicesCurtain Bathroom Toilet: Standard Bathroom Accessibility: Yes   Home Equipment: None          Prior Functioning/Environment Level of Independence: Independent                                       End of Session Nurse Communication: Mobility status  Activity Tolerance: Patient tolerated treatment well Patient left: in bed;with call  bell/phone within reach   Time: 0924-0944 OT Time Calculation (min): 20 min Charges:  OT General Charges $OT Visit: 1 Procedure OT Evaluation $Initial OT Evaluation Tier I: 1 Procedure OT Treatments $Self Care/Home Management : 8-22 mins G-Codes:    Luverna Degenhart 06-18-2014, 9:50 AM OTR/L
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
## 2014-05-25 NOTE — Progress Notes (Signed)
CARE MANAGEMENT NOTE 05/25/2014  Patient:  Lindsey Leon,[name]   Account Number:  [id number]  Date Initiated:  05/25/2014  Documentation initiated by:  Jiles CrockerHANDLER,Corsica Franson  Subjective/Objective Assessment:   ADMITTED WITH [facility]     Action/Plan:   CM FOLLOWING FOR DCP   Anticipated DC Date:  05/29/2014   Anticipated DC Plan:  HOME/SELF CARE     DC Planning Services  CM consult         Status of service:  In process, will continue to follow Medicare Important Message given?   (If response is "NO", the following Medicare IM given date fields will be blank)  Per UR Regulation:  Reviewed for med. necessity/level of care/duration of stay  Comments:  8/10/2015Abelino Derrick- B Efrem Pitstick RN,BSN,MHA [phone]

## 2014-05-26 MED ORDER — HYDROCHLOROTHIAZIDE 12.5 MG PO CAPS: 12.5000 mg | ORAL_CAPSULE | Freq: Every day | ORAL | Status: DC

## 2014-05-26 NOTE — Discharge Instructions (Signed)
[facility] for [facility]. Follow up in stroke Clinic with Dr Roda ShuttersXu in 4-6 weeks
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
## 2014-05-26 NOTE — Progress Notes (Signed)
Pt d/c to home with family by car.  Pt verbalized understanding of discharge instructions and new medication.  Assessment stable.

## 2014-05-26 NOTE — Progress Notes (Signed)
Talked to patient about follow up care; [facility]; CM encouraged patient to make an apt within one week of discharge; Patient has private insurance with BCBS with prescription drug coverage; Pharmacy of choice is [facility]; patient stated that she does not have any problems getting her medication; Lives alone, independent of all ADL'sAlexis Leon; B Tena Linebaugh RN,BSN,MHA [phone]

## 2014-06-06 ENCOUNTER — Emergency Department (HOSPITAL_COMMUNITY)
Admission: EM | Admit: 2014-06-06 | Discharge: 2014-06-06 | Disposition: A | Discharge status: Home / Self Care | Payer: BC Managed Care – PPO | Attending: Emergency Medicine | Admitting: Emergency Medicine

## 2014-06-06 ENCOUNTER — Encounter (HOSPITAL_COMMUNITY): Payer: Self-pay | Admitting: Emergency Medicine

## 2014-06-06 ENCOUNTER — Emergency Department (HOSPITAL_COMMUNITY): Payer: BC Managed Care – PPO

## 2014-06-06 DIAGNOSIS — I10 Essential (primary) hypertension: Secondary | ICD-10-CM | POA: Insufficient documentation

## 2014-06-06 DIAGNOSIS — Z79899 Other long term (current) drug therapy: Secondary | ICD-10-CM | POA: Diagnosis not present

## 2014-06-06 DIAGNOSIS — R200 Anesthesia of skin: Secondary | ICD-10-CM

## 2014-06-06 DIAGNOSIS — R209 Unspecified disturbances of skin sensation: Secondary | ICD-10-CM | POA: Diagnosis not present

## 2014-06-06 DIAGNOSIS — F17200 Nicotine dependence, unspecified, uncomplicated: Secondary | ICD-10-CM | POA: Diagnosis not present

## 2014-06-06 DIAGNOSIS — R202 Paresthesia of skin: Secondary | ICD-10-CM

## 2014-06-06 DIAGNOSIS — Z8719 Personal history of other diseases of the digestive system: Secondary | ICD-10-CM | POA: Insufficient documentation

## 2014-06-06 LAB — BASIC METABOLIC PANEL
Anion gap: 14 (ref 5–15)
BUN: 11 mg/dL (ref 6–23)
CALCIUM: 10.6 mg/dL — AB (ref 8.4–10.5)
CO2: 29 meq/L (ref 19–32)
Chloride: 97 mEq/L (ref 96–112)
Creatinine, Ser: 0.76 mg/dL (ref 0.50–1.10)
GFR calc Af Amer: >90 mL/min (ref >90)
GFR calc non Af Amer: >90 mL/min (ref >90)
GLUCOSE: 104 mg/dL — AB (ref 70–99)
POTASSIUM: 3.9 meq/L (ref 3.7–5.3)
Sodium: 140 mEq/L (ref 137–147)

## 2014-06-06 LAB — CBC WITH DIFFERENTIAL/PLATELET
Basophils Absolute: 0 10*3/uL (ref 0.0–0.1)
Basophils Relative: 0 % (ref 0–1)
EOS ABS: 0 10*3/uL (ref 0.0–0.7)
EOS PCT: 0 % (ref 0–5)
HEMATOCRIT: 40.6 % (ref 36.0–46.0)
Hemoglobin: 13.8 g/dL (ref 12.0–15.0)
LYMPHS ABS: 1.4 10*3/uL (ref 0.7–4.0)
Lymphocytes Relative: 26 % (ref 12–46)
MCH: 29.6 pg (ref 26.0–34.0)
MCHC: 34 g/dL (ref 30.0–36.0)
MCV: 87.1 fL (ref 78.0–100.0)
MONOS PCT: 5 % (ref 3–12)
Monocytes Absolute: 0.3 10*3/uL (ref 0.1–1.0)
Neutro Abs: 3.7 10*3/uL (ref 1.7–7.7)
Neutrophils Relative %: 69 % (ref 43–77)
Platelets: 197 10*3/uL (ref 150–400)
RBC: 4.66 MIL/uL (ref 3.87–5.11)
RDW: 12.6 % (ref 11.5–15.5)
WBC: 5.4 10*3/uL (ref 4.0–10.5)

## 2014-06-06 NOTE — ED Provider Notes (Signed)
CSN: [id number]     Arrival date & time 06/06/14  1414 History   First MD Initiated Contact with Patient 06/06/14 1434     Chief Complaint  Patient presents with  . Numbness  . Tingling     (Consider location/radiation/quality/duration/timing/severity/associated sxs/prior Treatment) HPI Ms. Peace is a 58 year old female with past medical history of CVA on 05/23/2014 who presents to the ER by EMS today after experiencing a "numbness" in her left arm. Patient states she was walking home from running errands, when she noticed a gradual onset of a numbness on the dorsal aspect of her left arm which began in the middle of her tricep and radiated just distal to her elbow. She states she did not notice anything that would aggravate or alleviate the symptoms, the numbness did not radiate, and she did not notice any associated weakness with it. She states she "became very anxious"and called 911. Patient reports that once the ambulance arrived, her numbness began to subside. She states since she has been in the ER, she has not experienced any of the numbness again.  Past Medical History  Diagnosis Date  . Pancreatitis   . Hypertension     not on medications  . Diverticulitis    Past Surgical History  Procedure Laterality Date  . Flexible sigmoidoscopy N/A 02/26/2013    Procedure: FLEXIBLE SIGMOIDOSCOPY;  Surgeon: Theda Belfast, MD;  Location: [facility] ENDOSCOPY;  Service: Endoscopy;  Laterality: N/A;   Family History  Problem Relation Age of Onset  . Cancer Mother     passed away. unknown age  . Healthy Father   . Hypertension Sister   . Diabetes Mellitus II Sister    History  Substance Use Topics  . Smoking status: Current Every Day Smoker -- 0.50 packs/day for 30 years    Types: Cigarettes  . Smokeless tobacco: Never Used  . Alcohol Use: No     Comment: drink 48 oz daily at weeak days and 80 oz daily on weekend. last drink on 02/23/13   OB History   Grav Para Term Preterm Abortions TAB  SAB Ect Mult Living                 Review of Systems  Constitutional: Negative for fever and chills.  HENT: Negative for trouble swallowing.   Eyes: Negative for visual disturbance.  Respiratory: Negative for shortness of breath.   Cardiovascular: Negative for chest pain.  Gastrointestinal: Negative for nausea, vomiting and abdominal pain.  Genitourinary: Negative for dysuria.  Musculoskeletal: Negative for neck pain.  Skin: Negative for rash.  Neurological: Positive for numbness. Negative for dizziness, syncope, facial asymmetry, speech difficulty, weakness, light-headedness and headaches.  Psychiatric/Behavioral: Negative.       Allergies  Review of patient's allergies indicates no known allergies.  Home Medications   Prior to Admission medications   Medication Sig Start Date End Date Taking? Authorizing Provider  amLODipine (NORVASC) 10 MG tablet Take 10 mg by mouth daily.   Yes Historical Provider, MD  hydrochlorothiazide (MICROZIDE) 12.5 MG capsule Take 1 capsule (12.5 mg total) by mouth daily. 05/26/14  Yes Delia Heady, MD   BP 130/68  Pulse 76  Temp(Src) 98.3 F (36.8 C) (Oral)  Resp 17  SpO2 99% Physical Exam  Constitutional: She is oriented to person, place, and time. She appears well-developed and well-nourished. No distress.  HENT:  Head: Normocephalic and atraumatic.  Mouth/Throat: Oropharynx is clear and moist. No oropharyngeal exudate.  Eyes: Right eye exhibits no discharge.  Left eye exhibits no discharge. No scleral icterus.  Neck: Normal range of motion.  Cardiovascular: Normal rate, regular rhythm and normal heart sounds.   No murmur heard. Pulmonary/Chest: Effort normal and breath sounds normal. No respiratory distress.  Abdominal: Soft. There is no tenderness.  Musculoskeletal: Normal range of motion. She exhibits no edema and no tenderness.  Neurological: She is alert and oriented to person, place, and time. She has normal strength and normal  reflexes. No cranial nerve deficit or sensory deficit. She displays a negative Romberg sign. Coordination and gait normal. GCS eye subscore is 4. GCS verbal subscore is 5. GCS motor subscore is 6.  No facial droop noted. Motor strength 5 out of 5 in all major muscle groups of upper and lower extremities. Patient able to ambulate with ease.  Skin: Skin is warm and dry. No rash noted. She is not diaphoretic.  Psychiatric: She has a normal mood and affect.    ED Course  Procedures (including critical care time) Labs Review Labs Reviewed  BASIC METABOLIC PANEL - Abnormal; Notable for the following:    Glucose, Bld 104 (*)    Calcium 10.6 (*)    All other components within normal limits  CBC WITH DIFFERENTIAL    Imaging Review Ct Head Wo Contrast  06/06/2014   CLINICAL DATA:  Hypertension.  Numbness and tingling in left arm  EXAM: CT HEAD WITHOUT CONTRAST  TECHNIQUE: Contiguous axial images were obtained from the base of the skull through the vertex without intravenous contrast.  COMPARISON:  05/23/2014  FINDINGS: Abnormal hyperdensity involving the left posterior temporal lobe is again identified and appears improved from previous exam compatible with resolving hemorrhage. No new areas of hemorrhage. No intraventricular hemorrhage or evidence of acute brain infarct. Again noted are chronic areas of hypo attenuation in both cerebral and cerebellar hemispheres compatible with chronic small vessel ischemic change. There is prominence of the sulci and ventricles compatible with brain atrophy. The paranasal sinuses and mastoid air cells appear clear. The skull is intact.  IMPRESSION: 1. Resolving intraparenchymal hemorrhage involving the left posterior temporal lobe. 2. No new abnormalities.   Electronically Signed   By: Signa Kellaylor  Stroud M.D.   On: 06/06/2014 16:12     EKG Interpretation   Date/Time:  Saturday June 06 2014 14:20:46 EDT Ventricular Rate:  81 PR Interval:  165 QRS Duration: 82 QT  Interval:  427 QTC Calculation: 496 R Axis:   58 Text Interpretation:  Sinus rhythm Ventricular premature complex Probable  left atrial enlargement Nonspecific T abnrm, anterolateral leads  Borderline prolonged QT interval Since previous tracing QT has normalized  Confirmed by Karma GanjaLINKER  MD, MARTHA [phone]) on 06/06/2014 4:16:42 PM      MDM   Final diagnoses:  Numbness of upper extremity  Paresthesia of left arm    58 year old female with past medical history of CVA 05/23/14 presenting by EMS after experiencing an acute onset of numbness in the dorsal aspect of her left arm. Patient denying weakness, dizziness, dysphasia, change in vision. Patient asymptomatic in the ER. Neuro exam benign. Workup to include CBC, BMP, CT head noncontrast to rule out new CVA.   4:45 PM: CT return showing evidence of resolving intraparenchymal hemorrhage as compared with old CT from 2 weeks ago. No acute abnormalities noted. Labs returned unremarkable. Patient remaining asymptomatic. We will discharge patient this time and have her follow up with her PCP. Patient states she has an appointment with her PCP in the near future. We encouraged  patient to call or return to ER should her symptoms return, worsen, change or should she have any questions or concerns.   Signed,  Ladona Mow, PA-C 5:25 PM     Monte Fantasia, PA-C 06/06/14 1725

## 2014-06-06 NOTE — ED Notes (Signed)
[facility] presents with a 58 yo female from home with numbness and tingling of left arm.  Pt was hospitalized last week in this facility for a stroke/TIA.  Pt was walking to store and coming back from store with pain in left arm, stated she got scared and called for help.  Pt does not complain of pain or numbness at this time.  12 lead unremarkable by [facility]/ [facility]R.  CBG 110.  Pt has history of marijuana and cigarette use.  Last use both yesterday.

## 2014-06-06 NOTE — Discharge Instructions (Signed)

## 2014-06-07 NOTE — ED Provider Notes (Signed)
Medical screening examination/treatment/procedure(s) were performed by non-physician practitioner and as supervising physician I was immediately available for consultation/collaboration.   EKG Interpretation   Date/Time:  Saturday June 06 2014 14:20:46 EDT Ventricular Rate:  81 PR Interval:  165 QRS Duration: 82 QT Interval:  427 QTC Calculation: 496 R Axis:   58 Text Interpretation:  Sinus rhythm Ventricular premature complex Probable  left atrial enlargement Nonspecific T abnrm, anterolateral leads  Borderline prolonged QT interval Since previous tracing QT has normalized  Confirmed by Karma Ganja  MD, MARTHA [phone]) on 06/06/2014 4:16:42 PM       Ethelda Chick, MD 06/07/14 [phone]

## 2014-07-08 ENCOUNTER — Ambulatory Visit: Payer: Self-pay | Admitting: Neurology

## 2014-07-09 ENCOUNTER — Ambulatory Visit: Payer: Self-pay | Admitting: Nurse Practitioner

## 2014-07-24 ENCOUNTER — Ambulatory Visit: Payer: Self-pay | Admitting: Nurse Practitioner

## 2014-08-10 ENCOUNTER — Encounter (HOSPITAL_COMMUNITY): Payer: Self-pay | Admitting: Emergency Medicine

## 2014-08-10 DIAGNOSIS — I10 Essential (primary) hypertension: Secondary | ICD-10-CM | POA: Diagnosis not present

## 2014-08-10 DIAGNOSIS — Z79899 Other long term (current) drug therapy: Secondary | ICD-10-CM | POA: Diagnosis not present

## 2014-08-10 DIAGNOSIS — Z720 Tobacco use: Secondary | ICD-10-CM | POA: Insufficient documentation

## 2014-08-10 DIAGNOSIS — R011 Cardiac murmur, unspecified: Secondary | ICD-10-CM | POA: Insufficient documentation

## 2014-08-10 DIAGNOSIS — Z8673 Personal history of transient ischemic attack (TIA), and cerebral infarction without residual deficits: Secondary | ICD-10-CM | POA: Insufficient documentation

## 2014-08-10 DIAGNOSIS — R42 Dizziness and giddiness: Secondary | ICD-10-CM | POA: Diagnosis present

## 2014-08-10 DIAGNOSIS — Z8719 Personal history of other diseases of the digestive system: Secondary | ICD-10-CM | POA: Diagnosis not present

## 2014-08-10 NOTE — ED Notes (Signed)
Pt. reported head and left arm " feels funny " onset today , denies headache , no chest pain / respirations unlabored . Alert and oriented , speech clear /no facial asytmmetry , no arm drift /ambulatory . CBG= 135 by EMS .

## 2014-08-11 ENCOUNTER — Emergency Department (HOSPITAL_COMMUNITY): Payer: BC Managed Care – PPO

## 2014-08-11 ENCOUNTER — Emergency Department (HOSPITAL_COMMUNITY)
Admission: EM | Admit: 2014-08-11 | Discharge: 2014-08-11 | Disposition: A | Discharge status: Home / Self Care | Payer: BC Managed Care – PPO | Attending: Emergency Medicine | Admitting: Emergency Medicine

## 2014-08-11 DIAGNOSIS — Z8673 Personal history of transient ischemic attack (TIA), and cerebral infarction without residual deficits: Secondary | ICD-10-CM

## 2014-08-11 HISTORY — DX: Cerebral infarction, unspecified: I63.9

## 2014-08-11 LAB — URINALYSIS, ROUTINE W REFLEX MICROSCOPIC
BILIRUBIN URINE: NEGATIVE
Glucose, UA: NEGATIVE mg/dL
HGB URINE DIPSTICK: NEGATIVE
Ketones, ur: NEGATIVE mg/dL
Nitrite: NEGATIVE
PH: 5.5 (ref 5.0–8.0)
PROTEIN: NEGATIVE mg/dL
Specific Gravity, Urine: 1.017 (ref 1.005–1.030)
Urobilinogen, UA: 0.2 mg/dL (ref 0.0–1.0)

## 2014-08-11 LAB — CBC WITH DIFFERENTIAL/PLATELET
Basophils Absolute: 0 10*3/uL (ref 0.0–0.1)
Basophils Relative: 0 % (ref 0–1)
EOS ABS: 0 10*3/uL (ref 0.0–0.7)
EOS PCT: 1 % (ref 0–5)
HEMATOCRIT: 36.4 % (ref 36.0–46.0)
Hemoglobin: 13.2 g/dL (ref 12.0–15.0)
LYMPHS ABS: 2.2 10*3/uL (ref 0.7–4.0)
LYMPHS PCT: 35 % (ref 12–46)
MCH: 32 pg (ref 26.0–34.0)
MCHC: 36.3 g/dL — ABNORMAL HIGH (ref 30.0–36.0)
MCV: 88.3 fL (ref 78.0–100.0)
MONO ABS: 0.4 10*3/uL (ref 0.1–1.0)
Monocytes Relative: 6 % (ref 3–12)
Neutro Abs: 3.6 10*3/uL (ref 1.7–7.7)
Neutrophils Relative %: 58 % (ref 43–77)
Platelets: 197 10*3/uL (ref 150–400)
RBC: 4.12 MIL/uL (ref 3.87–5.11)
RDW: 13 % (ref 11.5–15.5)
WBC: 6.2 10*3/uL (ref 4.0–10.5)

## 2014-08-11 LAB — URINE MICROSCOPIC-ADD ON

## 2014-08-11 LAB — BASIC METABOLIC PANEL
Anion gap: 16 — ABNORMAL HIGH (ref 5–15)
BUN: 10 mg/dL (ref 6–23)
CO2: 25 meq/L (ref 19–32)
CREATININE: 0.61 mg/dL (ref 0.50–1.10)
Calcium: 9.6 mg/dL (ref 8.4–10.5)
Chloride: 100 mEq/L (ref 96–112)
GFR calc Af Amer: >90 mL/min (ref >90)
GFR calc non Af Amer: >90 mL/min (ref >90)
GLUCOSE: 114 mg/dL — AB (ref 70–99)
Potassium: 3 mEq/L — ABNORMAL LOW (ref 3.7–5.3)
SODIUM: 141 meq/L (ref 137–147)

## 2014-08-11 MED ORDER — POTASSIUM CHLORIDE 20 MEQ/15ML (10%) PO LIQD
40.0000 meq | Freq: Once | ORAL | Status: AC
  Administered 2014-08-11: 40 meq via ORAL
  Filled 2014-08-11: qty 30

## 2014-08-11 NOTE — Discharge Instructions (Signed)
You were evaluated today for "feeling funny." Your evaluation is reassuring. He has no evidence of rebleeding on her CT scan. You do need to follow-up with neurology as an outpatient. If you develop any new weakness, numbness, tingling, speech disturbance or any new or worsening symptoms you should be reevaluated immediately.

## 2014-08-11 NOTE — ED Provider Notes (Signed)
CSN: [id number]     Arrival date & time 08/10/14  1944 History   First MD Initiated Contact with Patient 08/11/14 0018     No chief complaint on file.    (Consider location/radiation/quality/duration/timing/severity/associated sxs/prior Treatment) HPI  This is a 58 year old female with history of hypertension, hemorrhagic stroke who presents with "feeling funny." Patient reports that earlier today she had onset of "swimmy headedness" in her left arm feeling funny. This lasted for approximately 30 minutes. She is currently back at her baseline. She denies any other weakness, numbness, tingling anywhere else. She denies any headache or chest pain. Patient states that she had similar symptoms in August when she had her stroke and this made her concerned. She has not followed up with a neurologist.  Past Medical History  Diagnosis Date  . Pancreatitis   . Hypertension     not on medications  . Diverticulitis   . Stroke    Past Surgical History  Procedure Laterality Date  . Flexible sigmoidoscopy N/A 02/26/2013    Procedure: FLEXIBLE SIGMOIDOSCOPY;  Surgeon: Theda BelfastPatrick D Hung, MD;  [facility];  Service: Endoscopy;  Laterality: N/A;   Family History  Problem Relation Age of Onset  . Cancer Mother     passed away. unknown age  . Healthy Father   . Hypertension Sister   . Diabetes Mellitus II Sister    History  Substance Use Topics  . Smoking status: Current Every Day Smoker -- 0.50 packs/day for 30 years    Types: Cigarettes  . Smokeless tobacco: Never Used  . Alcohol Use: No     Comment: drink 48 oz daily at weeak days and 80 oz daily on weekend. last drink on 02/23/13   OB History   Grav Para Term Preterm Abortions TAB SAB Ect Mult Living                 Review of Systems  Constitutional: Negative for fever.  Respiratory: Negative for chest tightness and shortness of breath.   Cardiovascular: Negative for chest pain.  Gastrointestinal: Negative for nausea,  vomiting and abdominal pain.  Genitourinary: Negative for dysuria.  Musculoskeletal: Negative for back pain.  Neurological: Positive for dizziness and light-headedness. Negative for syncope, speech difficulty, weakness and headaches.  Psychiatric/Behavioral: Negative for confusion.  All other systems reviewed and are negative.     Allergies  Review of patient's allergies indicates no known allergies.  Home Medications   Prior to Admission medications   Medication Sig Start Date End Date Taking? Authorizing Provider  amLODipine (NORVASC) 10 MG tablet Take 10 mg by mouth daily.   Yes Historical Provider, MD  hydrochlorothiazide (MICROZIDE) 12.5 MG capsule Take 1 capsule (12.5 mg total) by mouth daily. 05/26/14  Yes Delia HeadyPramod Sethi, MD   BP 145/69  Pulse 73  Temp(Src) 98.6 F (37 C) (Oral)  Resp 17  SpO2 98% Physical Exam  Nursing note and vitals reviewed. Constitutional: She is oriented to person, place, and time. She appears well-developed and well-nourished. No distress.  HENT:  Head: Normocephalic and atraumatic.  Poor dentition  Eyes: EOM are normal. Pupils are equal, round, and reactive to light.  Neck: Neck supple.  Cardiovascular: Normal rate and regular rhythm.   Murmur heard. Pulmonary/Chest: Effort normal and breath sounds normal. No respiratory distress. She has no wheezes.  Abdominal: Soft. Bowel sounds are normal. There is no tenderness.  Musculoskeletal: She exhibits no edema.  Neurological: She is alert and oriented to person, place, and time.  No dysmetria to finger-nose-finger, no drift noted, 5 out of 5 strength in all 4 extremities  Skin: Skin is warm and dry.  Psychiatric: She has a normal mood and affect.    ED Course  Procedures (including critical care time) Labs Review Labs Reviewed  CBC WITH DIFFERENTIAL - Abnormal; Notable for the following:    MCHC 36.3 (*)    All other components within normal limits  BASIC METABOLIC PANEL - Abnormal; Notable  for the following:    Potassium 3.0 (*)    Glucose, Bld 114 (*)    Anion gap 16 (*)    All other components within normal limits  URINALYSIS, ROUTINE W REFLEX MICROSCOPIC - Abnormal; Notable for the following:    APPearance CLOUDY (*)    Leukocytes, UA MODERATE (*)    All other components within normal limits  URINE MICROSCOPIC-ADD ON    Imaging Review Ct Head Wo Contrast  08/11/2014   CLINICAL DATA:  Left arm weakness, recent stroke history.  EXAM: CT HEAD WITHOUT CONTRAST  TECHNIQUE: Contiguous axial images were obtained from the base of the skull through the vertex without intravenous contrast.  COMPARISON:  05/23/2014, 06/06/2014  FINDINGS: Subcortical and periventricular white matter hypodensities, a nonspecific finding most often seen in the setting of chronic microangiopathic change. Bilateral thalamic remote lacunar infarctions. Sequelae of prior left posterior temporal lobe hemorrhage with mild cortical laminar necrosis suspected. No definite CT evidence of an acute infarction. No definite residual intraparenchymal hemorrhage or abnormal extra-axial fluid collection. No hydrocephalus. The visualized paranasal sinuses and mastoid air cells are predominantly clear.  IMPRESSION: Sequelae of prior left temporal lobe hemorrhage with mild cortical laminar necrosis suspected. No residual hemorrhage identified.  Sequelae of prior bilateral thalamic lacunar infarctions.  White matter changes as above, similar to prior.   Electronically Signed   By: Jearld LeschAndrew  DelGaizo M.D.   On: 08/11/2014 01:54     EKG Interpretation   Date/Time:  Tuesday August 11 2014 01:12:36 EDT Ventricular Rate:  63 PR Interval:  187 QRS Duration: 87 QT Interval:  451 QTC Calculation: 462 R Axis:   67 Text Interpretation:  Sinus rhythm Nonspecific T abnrm, anterolateral  leads Baseline wander in lead(s) V6 Similar to prior Confirmed by HORTON   MD, COURTNEY ([id number]) on 08/11/2014 1:50:47 AM      MDM   Final  diagnoses:  History of stroke in prior 3 months   Patient presents with "feeling funny." It is currently at her baseline and denies any symptoms. Reports she had similar symptoms when she had a stroke in August. I have reviewed the patient's chart and it appears she had a hemorrhagic stroke but was altered at the time of presentation. She is nonfocal at this time.  Basic laboratory obtained and largely reassuring. CT head shows no evidence of acute or residual hemorrhage. Does show sequelae of prior infarcts.  Lab work shows mild hypokalemia. This was replaced. On repeat exam, patient continues to be asymptomatic. She is nonfocal. Low suspicion at this time for TIA as the cause of the patient's symptoms. She does need follow-up with neurology as she has not done such since discharge. This was explained to the patient and she stated understanding.  After history, exam, and medical workup I feel the patient has been appropriately medically screened and is safe for discharge home. Pertinent diagnoses were discussed with the patient. Patient was given return precautions.    Shon Batonourtney F Horton, MD 08/11/14 [phone]

## 2014-08-18 ENCOUNTER — Encounter: Payer: Self-pay | Admitting: Neurology

## 2014-08-18 ENCOUNTER — Ambulatory Visit (INDEPENDENT_AMBULATORY_CARE_PROVIDER_SITE_OTHER): Payer: BC Managed Care – PPO | Admitting: Neurology

## 2014-08-18 VITALS — BP 112/60 | HR 61 | Ht 67.0 in | Wt 136.2 lb

## 2014-08-18 DIAGNOSIS — I10 Essential (primary) hypertension: Secondary | ICD-10-CM

## 2014-08-18 DIAGNOSIS — I610 Nontraumatic intracerebral hemorrhage in hemisphere, subcortical: Secondary | ICD-10-CM

## 2014-08-18 NOTE — Patient Instructions (Signed)
-   you are doing very well from our standpoint - your BP much better controlled - quit smoking completely - continue BP medication - will repeat MRI to rule out other structural lesion causing the bleeding - will consider baby ASA and low dose statin for stroke prevention after MRI showing blood completely reabsorbed. - Follow up with your primary care physician for stroke risk factor modification. Recommend maintain blood pressure goal <130/80, diabetes with hemoglobin A1c goal below 6.5% and lipids with LDL cholesterol goal below 70 mg/dL.  - follow up in 3 months.

## 2014-08-18 NOTE — Progress Notes (Signed)
STROKE NEUROLOGY FOLLOW UP NOTE  NAME: Lindsey Leon DOB: 27-Sep-1956  REASON FOR VISIT: stroke follow up HISTORY FROM: pt and chart  Today we had the pleasure of seeing Lindsey Leon in follow-up at our Neurology Clinic. Pt was accompanied by no one.   History Summary Lindsey Leon is a 58 y.o. female with a history of hypertension was admitted on 05/23/14 due to confusion. CT showed a left temporal hemorrhage. Her BP was 195/93 in ER. She was admitted to the neuro ICU for further evaluation and treatment. Her blood pressure was kept tightly controlled initially with Cardene drip and when she is able to swallow she was started on hydrochlorothiazide. Cardene drip was weaned off. Repeat CT scan of the head showed stable appearance of the hematoma with mild surrounding edema. Subsequently an MRI scan of the brain was obtained which showed no underlying structural lesion. MRA of the brain showed no large vessel occlusion, aneurysm or AVM. Urine drug screen was positive for cannabis. Carotid Doppler showed 60-79% right ICA stenosis which was asymptomatic. 2D echo showed mild concentric hypertrophy but normal ejection fraction. EKG showed sinus rhythm. Telemetry monitoring revealed no significant arrhythmias. She did well following hospitalization and her confusion and speech improved significantly. She initially had some word finding difficulties but at the time of discharge those also resolved. She had no significant deficits at the time of discharge and blood pressure was quite well controlled.   Interval History During the interval time, the patient has been doing well. He went to ED in August and in October, for left arm numbness as per chart. However, when I asked her about these two events, she stated the events were due to her episodes of anxiety and panic attacks, and she denies any numbness or tingling. Her BP was well controlled on norvasc and HCTZ, today in clinic 112/60.   REVIEW OF  SYSTEMS: Full 14 system review of systems performed and notable only for those listed below and in HPI above, all others are negative:  Constitutional: N/A  Cardiovascular: N/A  Ear/Nose/Throat: N/A  Skin: N/A  Eyes: N/A  Respiratory: N/A  Gastroitestinal: N/A  Genitourinary: N/A Hematology/Lymphatic: N/A  Endocrine: N/A  Musculoskeletal: N/A  Allergy/Immunology: N/A  Neurological: N/A  Psychiatric: N/A  The following represents the patient's updated allergies and side effects list: No Known Allergies  Labs since last visit of relevance include the following: Results for orders placed or performed during the hospital encounter of 08/11/14  CBC with Differential  Result Value Ref Range   WBC 6.2 4.0 - 10.5 K/uL   RBC 4.12 3.87 - 5.11 MIL/uL   Hemoglobin 13.2 12.0 - 15.0 g/dL   HCT 16.1 09.6 - 04.5 %   MCV 88.3 78.0 - 100.0 fL   MCH 32.0 26.0 - 34.0 pg   MCHC 36.3 (H) 30.0 - 36.0 g/dL   RDW 40.9 81.1 - 91.4 %   Platelets 197 150 - 400 K/uL   Neutrophils Relative % 58 43 - 77 %   Neutro Abs 3.6 1.7 - 7.7 K/uL   Lymphocytes Relative 35 12 - 46 %   Lymphs Abs 2.2 0.7 - 4.0 K/uL   Monocytes Relative 6 3 - 12 %   Monocytes Absolute 0.4 0.1 - 1.0 K/uL   Eosinophils Relative 1 0 - 5 %   Eosinophils Absolute 0.0 0.0 - 0.7 K/uL   Basophils Relative 0 0 - 1 %   Basophils Absolute 0.0 0.0 - 0.1  K/uL  Basic metabolic panel  Result Value Ref Range   Sodium 141 137 - 147 mEq/L   Potassium 3.0 (L) 3.7 - 5.3 mEq/L   Chloride 100 96 - 112 mEq/L   CO2 25 19 - 32 mEq/L   Glucose, Bld 114 (H) 70 - 99 mg/dL   BUN 10 6 - 23 mg/dL   Creatinine, Ser 1.610.61 0.50 - 1.10 mg/dL   Calcium 9.6 8.4 - 09.610.5 mg/dL   GFR calc non Af Amer >90 >90 mL/min   GFR calc Af Amer >90 >90 mL/min   Anion gap 16 (H) 5 - 15  Urinalysis, Routine w reflex microscopic  Result Value Ref Range   Color, Urine YELLOW YELLOW   APPearance CLOUDY (A) CLEAR   Specific Gravity, Urine 1.017 1.005 - 1.030   pH 5.5 5.0 -  8.0   Glucose, UA NEGATIVE NEGATIVE mg/dL   Hgb urine dipstick NEGATIVE NEGATIVE   Bilirubin Urine NEGATIVE NEGATIVE   Ketones, ur NEGATIVE NEGATIVE mg/dL   Protein, ur NEGATIVE NEGATIVE mg/dL   Urobilinogen, UA 0.2 0.0 - 1.0 mg/dL   Nitrite NEGATIVE NEGATIVE   Leukocytes, UA MODERATE (A) NEGATIVE  Urine microscopic-add on  Result Value Ref Range   Squamous Epithelial / LPF RARE RARE   WBC, UA 7-10 <3 WBC/hpf   RBC / HPF 0-2 <3 RBC/hpf   Bacteria, UA RARE RARE    The neurologically relevant items on the patient's problem list were reviewed on today's visit.  Neurologic Examination  A problem focused neurological exam (12 or more points of the single system neurologic examination, vital signs counts as 1 point, cranial nerves count for 8 points) was performed.  Blood pressure 112/60, pulse 61, height 5\' 7"  (1.702 m), weight 136 lb 3.2 oz (61.78 kg).  General - Well nourished, well developed, in no apparent distress.  Ophthalmologic - Sharp disc margins OU.  Cardiovascular - Regular rate and rhythm with no murmur.  Mental Status -  Level of arousal and orientation to time, place, and person were intact. Language including expression, naming, repetition, comprehension, reading, and writing was assessed and found intact.  Cranial Nerves II - XII - II - Visual field intact OU. III, IV, VI - Extraocular movements intact. V - Facial sensation intact bilaterally. VII - Facial movement intact bilaterally. VIII - Hearing & vestibular intact bilaterally. X - Palate elevates symmetrically. XI - Chin turning & shoulder shrug intact bilaterally. XII - Tongue protrusion intact.  Motor Strength - The patient's strength was normal in all extremities and pronator drift was absent.  Bulk was normal and fasciculations were absent.   Motor Tone - Muscle tone was assessed at the neck and appendages and was normal.  Reflexes - The patient's reflexes were normal in all extremities and she had  no pathological reflexes.  Sensory - Light touch, temperature/pinprick, vibration and proprioception, and Romberg testing were assessed and were normal.    Coordination - The patient had normal movements in the hands and feet with no ataxia or dysmetria.  Tremor was absent.  Gait and Station - The patient's transfers, posture, gait, station, and turns were observed as normal.  Data reviewed: I personally reviewed the images and agree with the radiology interpretations.  MRI and MRA of the brain  1. Left posterior superior temporal lobe hemorrhage appears stable from the recent CT. Associated edema but no significant mass effect. 2. There is a small acute associated left periatrial white matter infarct. 3. Underlying advanced chronic  small vessel ischemia. 4. Poor flow or occlusion of the distal left vertebral artery. Moderate right and severe left PCA P2 segment stenosis with preserved distal flow. 5. Negative anterior circulation.  Carotid Doppler There is right 60-79% ICA stenosis, highest end of range. There is 1-39% left ICA stenosis. Bilateral vertebral artery flow is antegrade.   2D Echocardiogram Left ventricle: The cavity size was normal. There was mild concentric hypertrophy. Systolic function was vigorous. The estimated ejection fraction was in the range of 65% to 70%. Wall motion was normal; there were no regional wall motion abnormalities  EKG Sinus rhythm Probable left atrial enlargement RSR' in V1 or V2, probably normal variant Borderline T abnormalities, lateral leads Prolonged QT interval Baseline wander in lead(s) III V2  Component     Latest Ref Rng 05/25/2014  Cholesterol     0 - 200 mg/dL 161126  Triglycerides     <150 mg/dL 096153 (H)  HDL     >04>39 mg/dL 32 (L)  Total CHOL/HDL Ratio      3.9  VLDL     0 - 40 mg/dL 31  LDL (calc)     0 - 99 mg/dL 63  Hgb V4UA1c MFr Bld     <5.7 % 5.8 (H)  Mean Plasma Glucose     <117 mg/dL 981120 (H)    Assessment:  As you may recall, she is a 58 y.o. African American female with PMH of HTN was admitted for left BG hemorrage. Her MRI also showed chronic bilateral BG lacunar strokes. Her BP is very well controlled now with HCTZ and norvasc. Will repeat MRI with and without contrast to rule out tumor, AVM, etc. Once blood completely resolved, we may consider ASA and low dose lipitor. Still smoking  Plan:  - MRI with and without contrast  - quit smoking completely - continue BP meds and check BP at home - will consider ASA 81 and low dose lipitor for stroke prevention - Follow up with your primary care physician for stroke risk factor modification. Recommend maintain blood pressure goal <130/80, diabetes with hemoglobin A1c goal below 6.5% and lipids with LDL cholesterol goal below 70 mg/dL.  - RTC in 3 months.  Orders Placed This Encounter  Procedures  . MR Brain W Wo Contrast    Standing Status: Future     Number of Occurrences:      Standing Expiration Date: 10/19/2015    Order Specific Question:  Reason for Exam (SYMPTOM  OR DIAGNOSIS REQUIRED)    Answer:  ICH    Order Specific Question:  Preferred imaging location?    Answer:  Internal    Order Specific Question:  Does the patient have a pacemaker or implanted devices?    Answer:  No    Order Specific Question:  What is the patient's sedation requirement?    Answer:  No Sedation  . MR MRA HEAD WO CONTRAST    Standing Status: Future     Number of Occurrences:      Standing Expiration Date: 10/19/2015    Order Specific Question:  Reason for Exam (SYMPTOM  OR DIAGNOSIS REQUIRED)    Answer:  ICH    Order Specific Question:  Preferred imaging location?    Answer:  Internal    Order Specific Question:  Does the patient have a pacemaker or implanted devices?    Answer:  No    Order Specific Question:  What is the patient's sedation requirement?    Answer:  No  Sedation    No orders of the defined types were placed in this encounter.    Patient  Instructions  - you are doing very well from our standpoint - your BP much better controlled - quit smoking completely - continue BP medication - will repeat MRI to rule out other structural lesion causing the bleeding - will consider baby ASA and low dose statin for stroke prevention after MRI showing blood completely reabsorbed. - Follow up with your primary care physician for stroke risk factor modification. Recommend maintain blood pressure goal <130/80, diabetes with hemoglobin A1c goal below 6.5% and lipids with LDL cholesterol goal below 70 mg/dL.  - follow up in 3 months.    Marvel Plan, MD PhD [facility] [address], [address] [place], [place] [address] [phone]

## 2014-09-03 ENCOUNTER — Ambulatory Visit (INDEPENDENT_AMBULATORY_CARE_PROVIDER_SITE_OTHER): Payer: BC Managed Care – PPO

## 2014-09-16 ENCOUNTER — Ambulatory Visit (INDEPENDENT_AMBULATORY_CARE_PROVIDER_SITE_OTHER): Payer: BC Managed Care – PPO

## 2014-09-16 DIAGNOSIS — I610 Nontraumatic intracerebral hemorrhage in hemisphere, subcortical: Secondary | ICD-10-CM

## 2014-09-16 MED ORDER — GADOPENTETATE DIMEGLUMINE 469.01 MG/ML IV SOLN: 13.0000 mL | Freq: Once | INTRAVENOUS | Status: AC | PRN

## 2014-11-23 ENCOUNTER — Ambulatory Visit: Payer: BC Managed Care – PPO | Admitting: Neurology

## 2015-04-23 IMAGING — CT CT HEAD W/O CM
1 series · 16 of 29 positions shown, 20 images · non-contrast
Comparison: 05/23/2014

CLINICAL DATA: Hypertension.  Numbness and tingling in left arm

EXAM:
CT HEAD WITHOUT CONTRAST
TECHNIQUE: Contiguous axial images were obtained from the base of the skull
through the vertex without intravenous contrast.

[Series 2: head 5.0 h30s · axial · 0.43mm/px · z∈[-161,-31]mm · 16 of 29 slices shown, 20 images]
[im 2/29  brain]
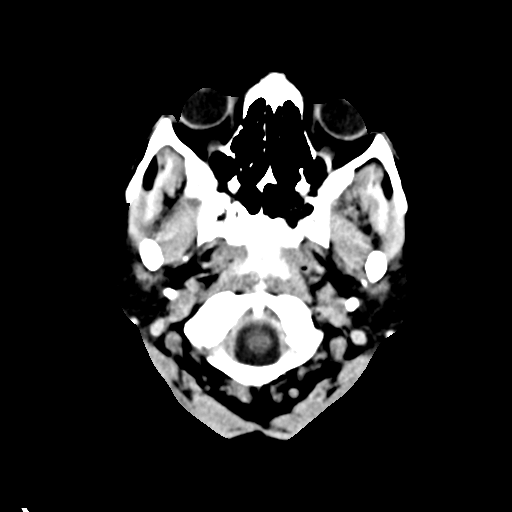
[im 2/29  bone]
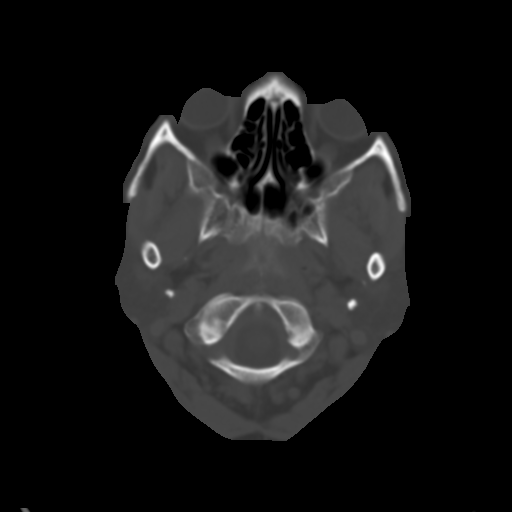
[im 4/29  brain]
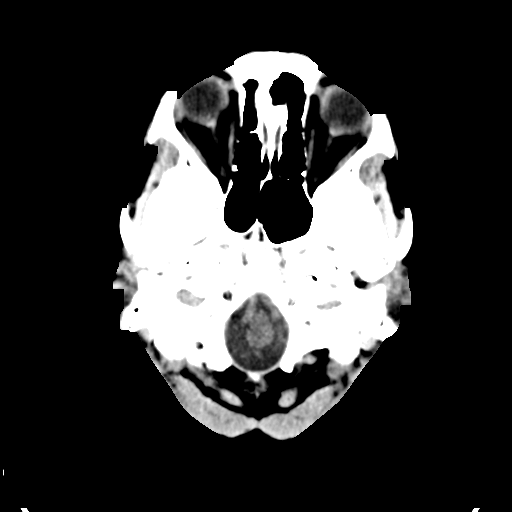
[im 6/29  brain]
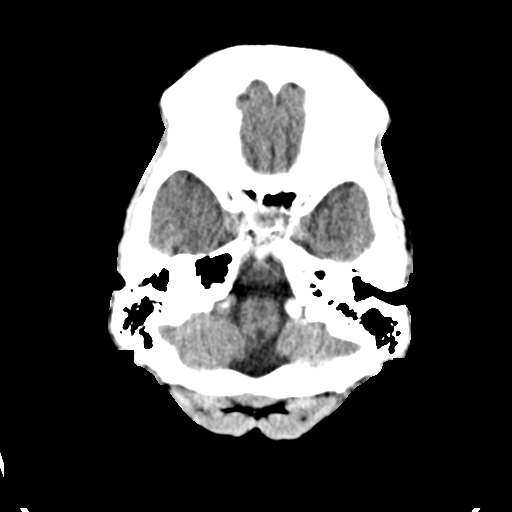
[im 7/29  brain]
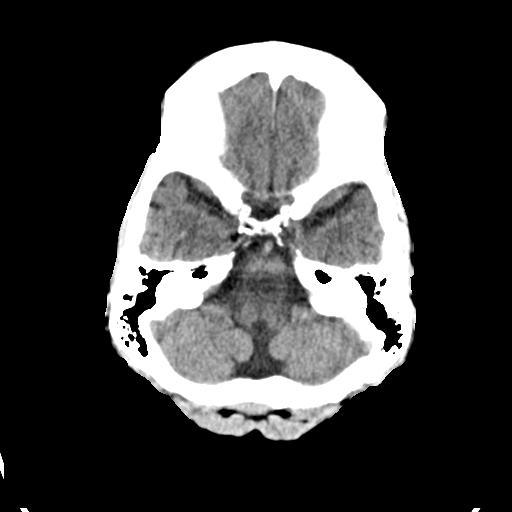
[im 9/29  brain]
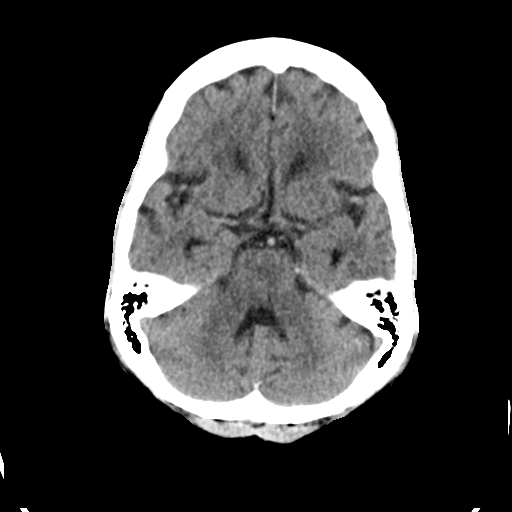
[im 9/29  bone]
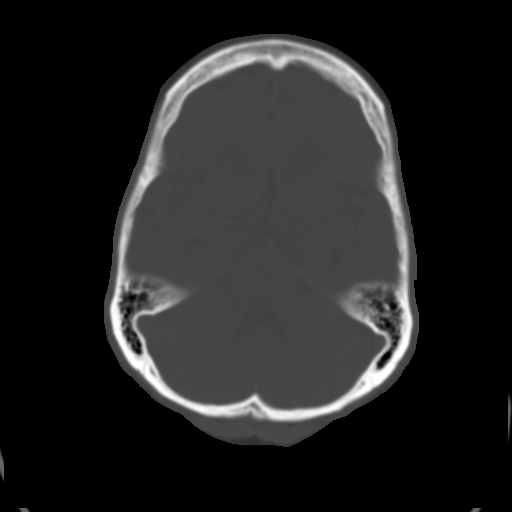
[im 11/29  brain]
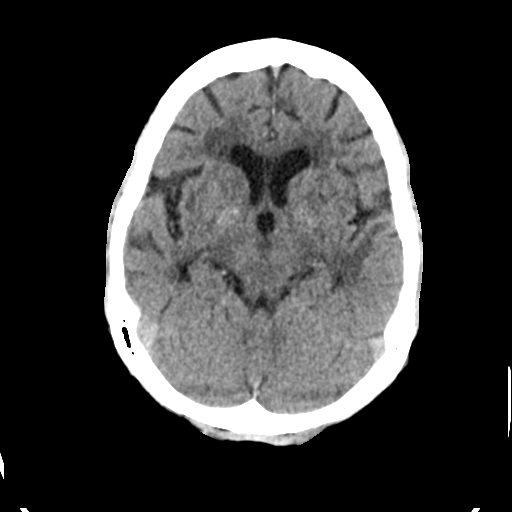
[im 12/29  brain]
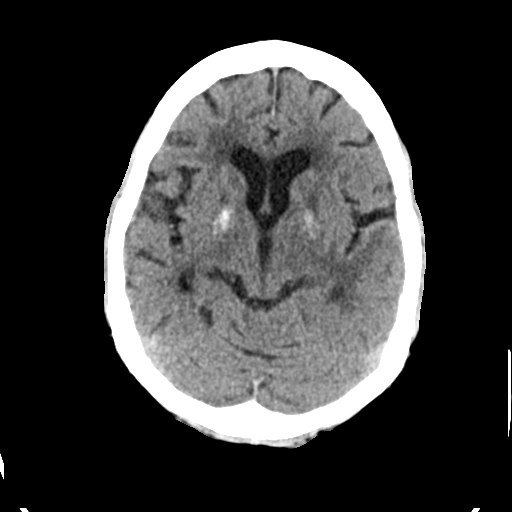
[im 14/29  brain]
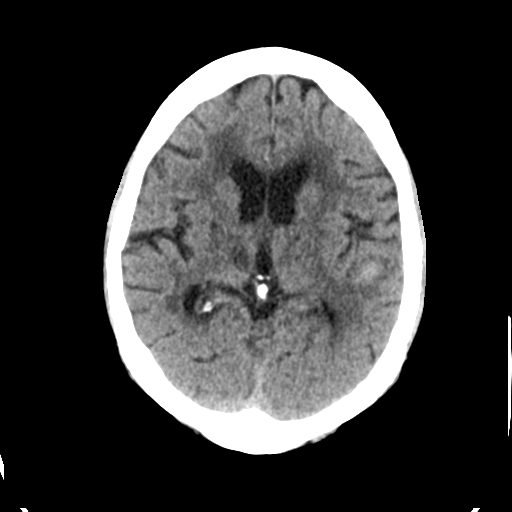
[im 16/29  brain]
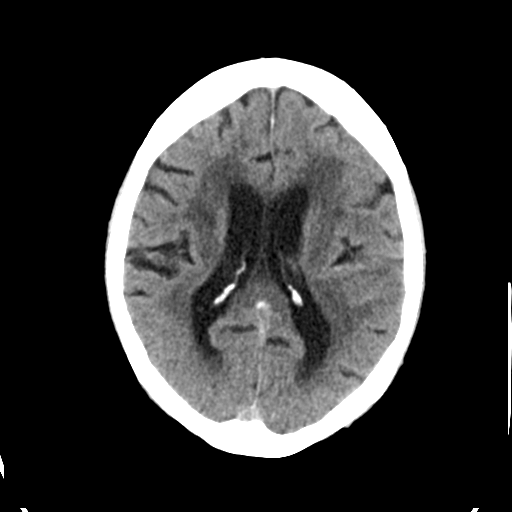
[im 16/29  bone]
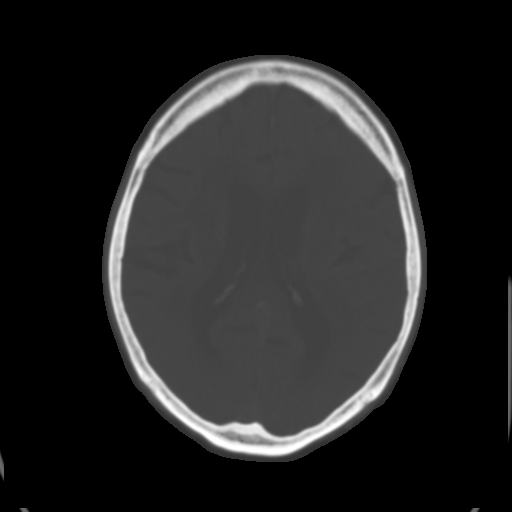
[im 18/29  brain]
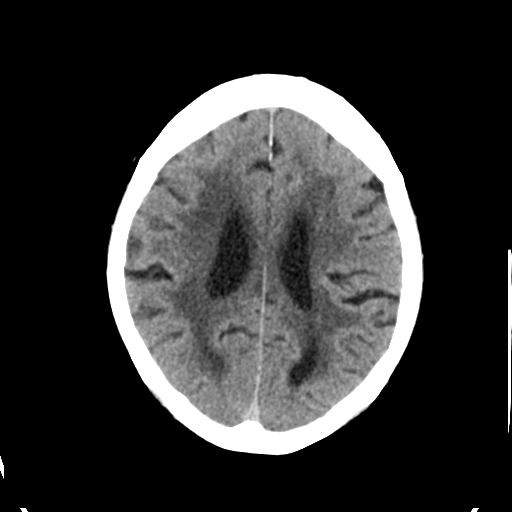
[im 19/29  brain]
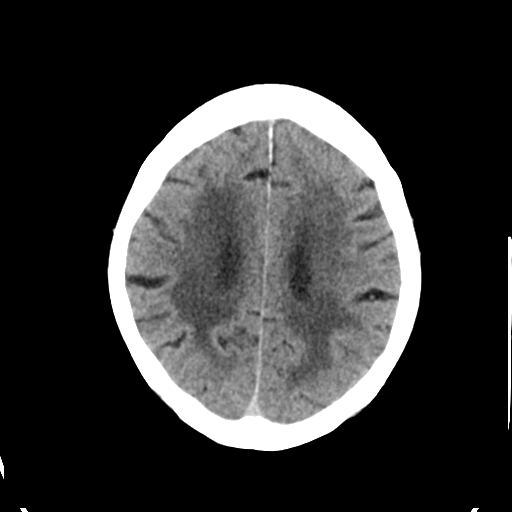
[im 21/29  brain]
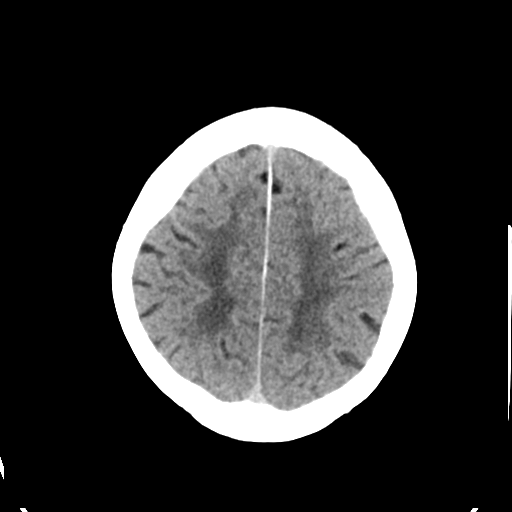
[im 23/29  brain]
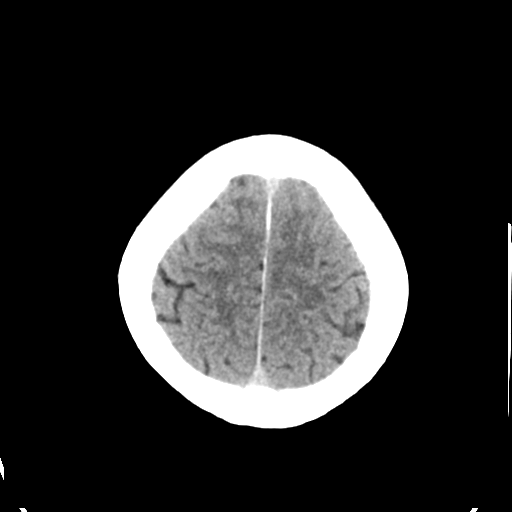
[im 23/29  bone]
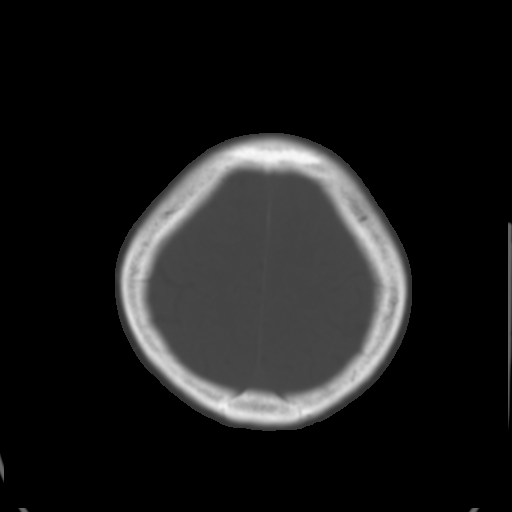
[im 24/29  brain]
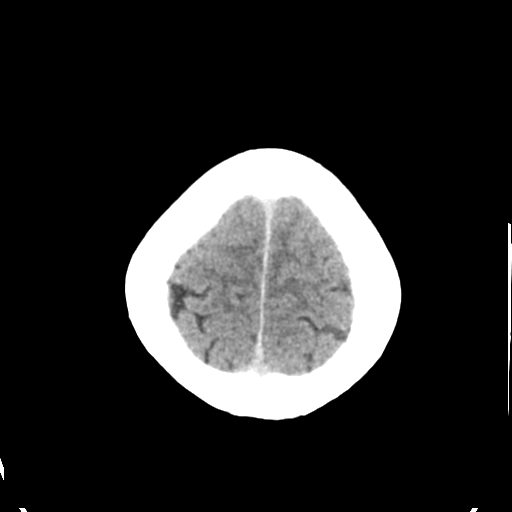
[im 26/29  brain]
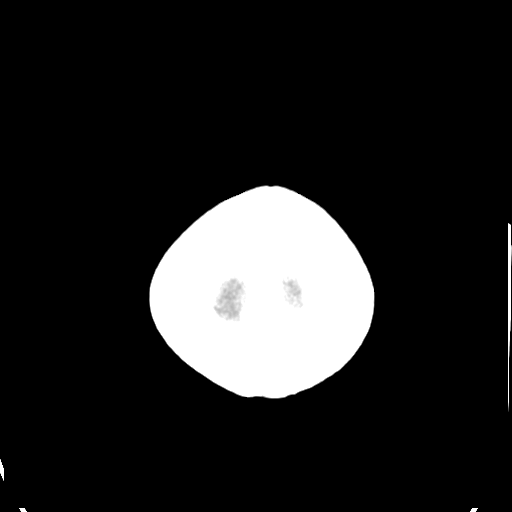
[im 28/29  brain]
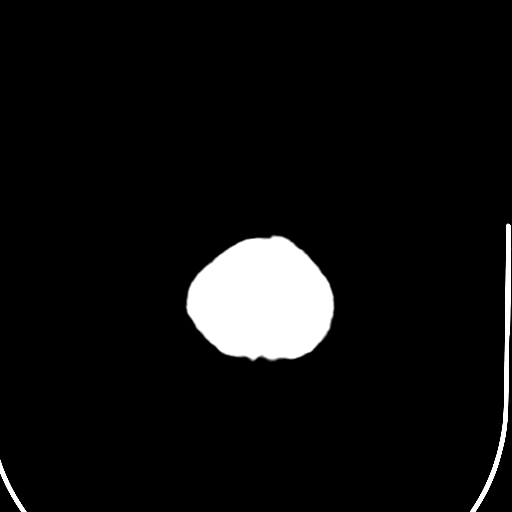

[16 of 29 positions shown; findings below may reference images not displayed]

FINDINGS: Abnormal hyperdensity involving the left posterior temporal lobe is
again identified and appears improved from previous exam compatible
with resolving hemorrhage. No new areas of hemorrhage. No
intraventricular hemorrhage or evidence of acute brain infarct.
Again noted are chronic areas of hypo attenuation in both cerebral
and cerebellar hemispheres compatible with chronic small vessel
ischemic change. There is prominence of the sulci and ventricles
compatible with brain atrophy. The paranasal sinuses and mastoid air
cells appear clear. The skull is intact.
IMPRESSION: 1. Resolving intraparenchymal hemorrhage involving the left
posterior temporal lobe.
2. No new abnormalities.

## 2015-07-12 ENCOUNTER — Encounter (HOSPITAL_COMMUNITY): Payer: Self-pay | Admitting: Oncology

## 2015-07-12 ENCOUNTER — Emergency Department (HOSPITAL_COMMUNITY)
Admission: EM | Admit: 2015-07-12 | Discharge: 2015-07-12 | Disposition: A | Discharge status: Home / Self Care | Payer: 59 | Attending: Emergency Medicine | Admitting: Emergency Medicine

## 2015-07-12 ENCOUNTER — Emergency Department (HOSPITAL_COMMUNITY): Payer: 59

## 2015-07-12 DIAGNOSIS — Z8719 Personal history of other diseases of the digestive system: Secondary | ICD-10-CM | POA: Insufficient documentation

## 2015-07-12 DIAGNOSIS — Z79899 Other long term (current) drug therapy: Secondary | ICD-10-CM | POA: Diagnosis not present

## 2015-07-12 DIAGNOSIS — X58XXXA Exposure to other specified factors, initial encounter: Secondary | ICD-10-CM | POA: Insufficient documentation

## 2015-07-12 DIAGNOSIS — S99912A Unspecified injury of left ankle, initial encounter: Secondary | ICD-10-CM | POA: Diagnosis present

## 2015-07-12 DIAGNOSIS — I10 Essential (primary) hypertension: Secondary | ICD-10-CM | POA: Insufficient documentation

## 2015-07-12 DIAGNOSIS — Z8673 Personal history of transient ischemic attack (TIA), and cerebral infarction without residual deficits: Secondary | ICD-10-CM | POA: Insufficient documentation

## 2015-07-12 DIAGNOSIS — S93402A Sprain of unspecified ligament of left ankle, initial encounter: Secondary | ICD-10-CM | POA: Insufficient documentation

## 2015-07-12 DIAGNOSIS — Z720 Tobacco use: Secondary | ICD-10-CM | POA: Insufficient documentation

## 2015-07-12 DIAGNOSIS — Y92003 Bedroom of unspecified non-institutional (private) residence as the place of occurrence of the external cause: Secondary | ICD-10-CM | POA: Diagnosis not present

## 2015-07-12 DIAGNOSIS — Y9389 Activity, other specified: Secondary | ICD-10-CM | POA: Diagnosis not present

## 2015-07-12 DIAGNOSIS — Y998 Other external cause status: Secondary | ICD-10-CM | POA: Insufficient documentation

## 2015-07-12 MED ORDER — IBUPROFEN 800 MG PO TABS
800.0000 mg | ORAL_TABLET | Freq: Once | ORAL | Status: AC
Start: 2015-07-12 — End: 2015-07-12
  Administered 2015-07-12: 800 mg via ORAL
  Filled 2015-07-12: qty 1

## 2015-07-12 MED ORDER — IBUPROFEN 600 MG PO TABS: 600.0000 mg | ORAL_TABLET | Freq: Four times a day (QID) | ORAL | Status: DC | PRN

## 2015-07-12 NOTE — ED Notes (Signed)
Per EMS pt stated she was, "fooling around in bed" w/ someone and hurt her left ankle.  Pt reported to EMS that her ankle did not begin to hurt until today.  Pt ambulatory at scene and into ambulance.  A&O x 4.  No obvious deformity noted by EMS did see mild swelling.

## 2015-07-12 NOTE — Discharge Instructions (Signed)
RICE: Routine Care for Injuries The routine care of many injuries includes Rest, Ice, Compression, and Elevation (RICE). HOME CARE INSTRUCTIONS  Rest is needed to allow your body to heal. Routine activities can usually be resumed when comfortable. Injured tendons and bones can take up to 6 weeks to heal. Tendons are the cord-like structures that attach muscle to bone.  Ice following an injury helps keep the swelling down and reduces pain.  Put ice in a plastic bag.  Place a towel between your skin and the bag.  Leave the ice on for 15-20 minutes, 3-4 times a day, or as directed by your health care provider. Do this while awake, for the first 24 to 48 hours. After that, continue as directed by your caregiver.  Compression helps keep swelling down. It also gives support and helps with discomfort. If an elastic bandage has been applied, it should be removed and reapplied every 3 to 4 hours. It should not be applied tightly, but firmly enough to keep swelling down. Watch fingers or toes for swelling, bluish discoloration, coldness, numbness, or excessive pain. If any of these problems occur, remove the bandage and reapply loosely. Contact your caregiver if these problems continue.  Elevation helps reduce swelling and decreases pain. With extremities, such as the arms, hands, legs, and feet, the injured area should be placed near or above the level of the heart, if possible. SEEK IMMEDIATE MEDICAL CARE IF:  You have persistent pain and swelling.  You develop redness, numbness, or unexpected weakness.  Your symptoms are getting worse rather than improving after several days. These symptoms may indicate that further evaluation or further X-rays are needed. Sometimes, X-rays may not show a small broken bone (fracture) until 1 week or 10 days later. Make a follow-up appointment with your caregiver. Ask when your X-ray results will be ready. Make sure you get your X-ray results. Document Released:  01/14/2001 Document Revised: 10/07/2013 Document Reviewed: 03/03/2011 [employer] Patient Information 2015 [employer]. This information is not intended to replace advice given to you by your health care provider. Make sure you discuss any questions you have with your health care provider.  

## 2015-07-12 NOTE — ED Provider Notes (Signed)
CSN: [id number]     Arrival date & time 07/12/15  0356 History   First MD Initiated Contact with Patient 07/12/15 0403     Chief Complaint  Patient presents with  . Ankle Pain    (Consider location/radiation/quality/duration/timing/severity/associated sxs/prior Treatment) Patient is a 59 y.o. female presenting with foot injury. The history is provided by the patient. No language interpreter was used.  Foot Injury Location:  Ankle Time since incident:  1 day Injury: yes   Mechanism of injury comment:  "fooling around in bed with someone" Ankle location:  L ankle Pain details:    Quality:  Aching   Radiates to:  Does not radiate   Severity:  Mild   Onset quality:  Gradual   Timing:  Constant   Progression:  Worsening Relieved by:  Elevation Worsened by:  Activity and bearing weight Ineffective treatments:  None tried Associated symptoms: swelling   Associated symptoms: no decreased ROM, no fever, no muscle weakness, no numbness and no tingling     Past Medical History  Diagnosis Date  . Pancreatitis   . Hypertension     not on medications  . Diverticulitis   . Stroke    Past Surgical History  Procedure Laterality Date  . Flexible sigmoidoscopy N/A 02/26/2013    Procedure: FLEXIBLE SIGMOIDOSCOPY;  Surgeon: Theda Belfast, MD;  Location: [facility] ENDOSCOPY;  Service: Endoscopy;  Laterality: N/A;   Family History  Problem Relation Age of Onset  . Cancer Mother     passed away. unknown age  . Healthy Father   . Hypertension Sister   . Diabetes Mellitus II Sister    Social History  Substance Use Topics  . Smoking status: Current Every Day Smoker -- 0.50 packs/day for 30 years    Types: Cigarettes  . Smokeless tobacco: Never Used  . Alcohol Use: No   OB History    No data available      Review of Systems  Constitutional: Negative for fever.  Musculoskeletal: Positive for joint swelling and arthralgias.  All other systems reviewed and are negative.   Allergies    Review of patient's allergies indicates no known allergies.  Home Medications   Prior to Admission medications   Medication Sig Start Date End Date Taking? Authorizing Provider  amLODipine (NORVASC) 10 MG tablet Take 10 mg by mouth daily.    Historical Provider, MD  hydrochlorothiazide (MICROZIDE) 12.5 MG capsule Take 1 capsule (12.5 mg total) by mouth daily. 05/26/14   [name], MD  ibuprofen (ADVIL,MOTRIN) 600 MG tablet Take 1 tablet (600 mg total) by mouth every 6 (six) hours as needed. 07/12/15   Antony Madura, PA-C   BP 120/62 mmHg  Pulse 73  Temp(Src) 98.7 F (37.1 C) (Oral)  Resp 15  Ht  (1.676 m)  Wt 136 lb (61.689 kg)  BMI 21.96 kg/m2  SpO2 98%   Physical Exam  Constitutional: She is oriented to person, place, and time. She appears well-developed and well-nourished. No distress.  Nontoxic/nonseptic appearing  HENT:  Head: Normocephalic and atraumatic.  Eyes: Conjunctivae and EOM are normal. No scleral icterus.  Neck: Normal range of motion.  Cardiovascular: Normal rate, regular rhythm and intact distal pulses.   DP and PT pulses 2+ in the left lower extremity.  Pulmonary/Chest: Effort normal and breath sounds normal. No respiratory distress.  Respirations even and unlabored.  Musculoskeletal:       Left ankle: She exhibits normal range of motion, no swelling, no deformity, no  laceration and normal pulse. Tenderness. Lateral malleolus tenderness found. Achilles tendon normal.       Left foot: There is tenderness (mild). There is normal range of motion.       Feet:  Neurological: She is alert and oriented to person, place, and time. She exhibits normal muscle tone. Coordination normal.  Sensation to light touch intact. Patient able to wiggle all toes.  Skin: Skin is warm and dry. No rash noted. She is not diaphoretic. No erythema. No pallor.  Psychiatric: She has a normal mood and affect. Her behavior is normal.  Nursing note and vitals reviewed.   ED Course   Procedures (including critical care time) Labs Review Labs Reviewed - No data to display  Imaging Review Dg Ankle Complete Left  07/12/2015   CLINICAL DATA:  Left ankle injury with pain.  Initial encounter.  EXAM: LEFT ANKLE COMPLETE - 3+ VIEW  COMPARISON:  None.  FINDINGS: There is no evidence of fracture, dislocation, or joint effusion.  IMPRESSION: Negative.   Electronically Signed   By: Marnee Spring M.D.   On: 07/12/2015 04:30     I have personally reviewed and evaluated these images and lab results as part of my medical decision-making.   EKG Interpretation None      MDM   Final diagnoses:  Ankle sprain, left, initial encounter    59 year old female presents to the emergency department for symptoms consistent with an ankle sprain. She was ambulatory on EMS arrival. No concern for septic joint or hemarthrosis. Patient is neurovascularly intact. ASO ankle brace applied in ED. Will discharge with instructions for icing, elevation, and NSAIDs. Primary care follow-up advised for wound recheck as needed. Return precautions given. Patient agreeable to plan with no unaddressed concerns. Patient discharged in good condition.   Filed Vitals:   07/12/15 0357 07/12/15 0401 07/12/15 0403 07/12/15 0430  BP: 122/68 122/56 130/65 120/62  Pulse: 83 83 86 73  Temp: 98.7 F (37.1 C) 98.7 F (37.1 C)    TempSrc: Oral Oral    Resp: 16 15    Height:   (1.676 m)    Weight:  136 lb (61.689 kg)    SpO2: 100% 100% 100% 98%     Antony Madura, PA-C 07/12/15 7253  Devoria Albe, MD 07/12/15 [phone]

## 2016-01-12 ENCOUNTER — Encounter (HOSPITAL_COMMUNITY): Payer: Self-pay

## 2016-01-12 ENCOUNTER — Emergency Department (HOSPITAL_COMMUNITY)
Admission: EM | Admit: 2016-01-12 | Discharge: 2016-01-12 | Disposition: A | Discharge status: Home / Self Care | Payer: Self-pay | Attending: Emergency Medicine | Admitting: Emergency Medicine

## 2016-01-12 DIAGNOSIS — F17210 Nicotine dependence, cigarettes, uncomplicated: Secondary | ICD-10-CM | POA: Insufficient documentation

## 2016-01-12 DIAGNOSIS — Z5321 Procedure and treatment not carried out due to patient leaving prior to being seen by health care provider: Secondary | ICD-10-CM | POA: Insufficient documentation

## 2016-01-12 DIAGNOSIS — I10 Essential (primary) hypertension: Secondary | ICD-10-CM | POA: Insufficient documentation

## 2016-01-12 NOTE — ED Notes (Signed)
Called for patient.  Not found in lobby 

## 2016-01-12 NOTE — ED Notes (Signed)
Called x 2 w/o answer in lobby.

## 2016-01-12 NOTE — ED Provider Notes (Signed)
Patient left without being seen  1. Patient left without being seen      Melene Planan Samari Gorby, DO 01/12/16 1954

## 2016-01-12 NOTE — ED Notes (Signed)
Per EMS states she gets nauseated when she takes potassium supplements-states she just started taking a few days ago-states she started feeling better once she got in ambulance-states symptoms heightened by her anxiety

## 2018-09-20 ENCOUNTER — Emergency Department (HOSPITAL_COMMUNITY)
Admission: EM | Admit: 2018-09-20 | Discharge: 2018-09-20 | Disposition: A | Discharge status: Home / Self Care | Payer: Self-pay | Attending: Emergency Medicine | Admitting: Emergency Medicine

## 2018-09-20 ENCOUNTER — Encounter (HOSPITAL_COMMUNITY): Payer: Self-pay

## 2018-09-20 ENCOUNTER — Other Ambulatory Visit: Payer: Self-pay

## 2018-09-20 DIAGNOSIS — Z8673 Personal history of transient ischemic attack (TIA), and cerebral infarction without residual deficits: Secondary | ICD-10-CM | POA: Insufficient documentation

## 2018-09-20 DIAGNOSIS — E876 Hypokalemia: Secondary | ICD-10-CM | POA: Insufficient documentation

## 2018-09-20 DIAGNOSIS — D649 Anemia, unspecified: Secondary | ICD-10-CM | POA: Insufficient documentation

## 2018-09-20 DIAGNOSIS — Z79899 Other long term (current) drug therapy: Secondary | ICD-10-CM | POA: Insufficient documentation

## 2018-09-20 DIAGNOSIS — I10 Essential (primary) hypertension: Secondary | ICD-10-CM | POA: Insufficient documentation

## 2018-09-20 DIAGNOSIS — Z87891 Personal history of nicotine dependence: Secondary | ICD-10-CM | POA: Insufficient documentation

## 2018-09-20 DIAGNOSIS — R531 Weakness: Secondary | ICD-10-CM | POA: Insufficient documentation

## 2018-09-20 LAB — CBG MONITORING, ED: GLUCOSE-CAPILLARY: 113 mg/dL — AB (ref 70–99)

## 2018-09-20 LAB — I-STAT CHEM 8, ED
BUN: 10 mg/dL (ref 8–23)
CHLORIDE: 100 mmol/L (ref 98–111)
Calcium, Ion: 1.18 mmol/L (ref 1.15–1.40)
Creatinine, Ser: 0.6 mg/dL (ref 0.44–1.00)
Glucose, Bld: 107 mg/dL — ABNORMAL HIGH (ref 70–99)
HEMATOCRIT: 33 % — AB (ref 36.0–46.0)
Hemoglobin: 11.2 g/dL — ABNORMAL LOW (ref 12.0–15.0)
POTASSIUM: 3.4 mmol/L — AB (ref 3.5–5.1)
SODIUM: 138 mmol/L (ref 135–145)
TCO2: 28 mmol/L (ref 22–32)

## 2018-09-20 LAB — POC OCCULT BLOOD, ED: FECAL OCCULT BLD: NEGATIVE

## 2018-09-20 MED ORDER — POTASSIUM CHLORIDE CRYS ER 20 MEQ PO TBCR
40.0000 meq | EXTENDED_RELEASE_TABLET | Freq: Once | ORAL | Status: AC
  Administered 2018-09-20: 40 meq via ORAL
  Filled 2018-09-20: qty 2

## 2018-09-20 NOTE — ED Notes (Signed)
Patient given discharge instructions and verbalized understanding.  Patient stable to discharge at this time.  Patient is alert and oriented to baseline.  No distressed noted at this time.  All belongings taken with the patient at discharge.   

## 2018-09-20 NOTE — ED Provider Notes (Signed)
[facility] EMERGENCY DEPARTMENT Provider Note   CSN: [id number] Arrival date & time: 09/20/18  1206     History   Chief Complaint Chief Complaint  Patient presents with  . Near Syncope    HPI Lindsey Leon is a 62 y.o. female.  HPI patient reports she had weakness today, generalized lasting approximately 10 minutes.  She was sitting in a chair she reports that she could hear people talking around her she felt as if she could lost full awareness of her surroundings.  She did not fall to the ground.  She did not suffer syncope.  Symptoms lasted 10 minutes in route resolved spontaneously without treatment.  She is presently asymptomatic chest pain no headache no shortness of breath no fever.  No other associated symptoms  Past Medical History:  Diagnosis Date  . Diverticulitis   . Hypertension    not on medications  . Pancreatitis   . Stroke [facility])    Heart murmur Patient Active Problem List   Diagnosis Date Noted  . ICH (intracerebral hemorrhage) (HCC) 05/23/2014  . Diverticulitis of colon 02/24/2013  . Pancreatitis, Hx of 02/24/2013  . Alcohol abuse 02/24/2013    Past Surgical History:  Procedure Laterality Date  . FLEXIBLE SIGMOIDOSCOPY N/A 02/26/2013   Procedure: FLEXIBLE SIGMOIDOSCOPY;  Surgeon: Theda Belfast, MD;  Location: [facility] ENDOSCOPY;  Service: Endoscopy;  Laterality: N/A;     OB History   None      Home Medications    Prior to Admission medications   Medication Sig Start Date End Date Taking? Authorizing Provider  amLODipine (NORVASC) 10 MG tablet Take 10 mg by mouth daily.    [provider]  hydrochlorothiazide (MICROZIDE) 12.5 MG capsule Take 1 capsule (12.5 mg total) by mouth daily. 05/26/14   [name], MD  ibuprofen (ADVIL,MOTRIN) 600 MG tablet Take 1 tablet (600 mg total) by mouth every 6 (six) hours as needed. 07/12/15   Antony Madura, PA-C    Family History Family History  Problem Relation Age of Onset  .  Healthy Father   . Cancer Mother        passed away. unknown age  . Hypertension Sister   . Diabetes Mellitus II Sister     Social History Social History   Tobacco Use  . Smoking status: Former Smoker    Packs/day: 0.50    Years: 30.00    Pack years: 15.00    Types: Cigarettes    Last attempt to quit: 05/22/2015    Years since quitting: 3.3  . Smokeless tobacco: Never Used  Substance Use Topics  . Alcohol use: No    Alcohol/week: 0.0 standard drinks  . Drug use: Yes    Types: Marijuana  Denies drug use   Allergies   Patient has no known allergies.   Review of Systems Review of Systems  Constitutional: Negative.   HENT: Negative.   Respiratory: Negative.   Cardiovascular: Negative.   Gastrointestinal: Negative.   Musculoskeletal: Negative.   Skin: Negative.   Neurological: Positive for weakness.       Generalized weakness, transient  Psychiatric/Behavioral: Negative.   All other systems reviewed and are negative.    Physical Exam Updated Vital Signs BP 106/60   Pulse 71   Temp 98.4 F (36.9 C) (Oral)   Resp 14   Ht 5\' 6"  (1.676 m)   Wt 54.4 kg   SpO2 100%   BMI 19.37 kg/m   Physical Exam  Constitutional:  She appears well-developed and well-nourished.  HENT:  Head: Normocephalic and atraumatic.  Eyes: Pupils are equal, round, and reactive to light. Conjunctivae are normal.  Neck: Neck supple. No tracheal deviation present. No thyromegaly present.  Cardiovascular: Normal rate and regular rhythm.  Murmur heard. 2/6 systolic murmur at left sternal border  Pulmonary/Chest: Effort normal and breath sounds normal.  Abdominal: Soft. Bowel sounds are normal. She exhibits no distension. There is no tenderness.  Genitourinary: Rectal exam shows guaiac negative stool.  Genitourinary Comments: Rectum normal tone brown stool Hemoccult negative  Musculoskeletal: Normal range of motion. She exhibits no edema or tenderness.  Neurological: She is alert.  Coordination normal.  Gait normal Romberg normal pronator drift normal finger-to-nose normal DTRs symmetric bilaterally at knee jerk ankle jerk and biceps toes downgoing bilaterally  Skin: Skin is warm and dry. No rash noted.  Psychiatric: She has a normal mood and affect.  Nursing note and vitals reviewed.    ED Treatments / Results  Labs (all labs ordered are listed, but only abnormal results are displayed) Labs Reviewed  CBG MONITORING, ED - Abnormal; Notable for the following components:      Result Value   Glucose-Capillary 113 (*)    All other components within normal limits  I-STAT CHEM 8, ED    EKG EKG Interpretation  Date/Time:  Friday September 20 2018 12:10:25 EST Ventricular Rate:  73 PR Interval:    QRS Duration: 98 QT Interval:  427 QTC Calculation: 471 R Axis:   40 Text Interpretation:  Sinus rhythm Borderline T abnormalities, anterior leads No significant change since last tracing Confirmed by Doug Sou [phone]) on 09/20/2018 1:24:51 PM  Results for orders placed or performed during the hospital encounter of 09/20/18  CBG monitoring, ED  Result Value Ref Range   Glucose-Capillary 113 (H) 70 - 99 mg/dL  I-stat chem 8, ed  Result Value Ref Range   Sodium 138 135 - 145 mmol/L   Potassium 3.4 (L) 3.5 - 5.1 mmol/L   Chloride 100 98 - 111 mmol/L   BUN 10 8 - 23 mg/dL   Creatinine, Ser 6.04 0.44 - 1.00 mg/dL   Glucose, Bld 540 (H) 70 - 99 mg/dL   Calcium, Ion 9.81 1.91 - 1.40 mmol/L   TCO2 28 22 - 32 mmol/L   Hemoglobin 11.2 (L) 12.0 - 15.0 g/dL   HCT 47.8 (L) 29.5 - 62.1 %  POC occult blood, ED Provider will collect  Result Value Ref Range   Fecal Occult Bld NEGATIVE NEGATIVE   No results found.  Radiology No results found.  Procedures Procedures (including critical care time)  Medications Ordered in ED Medications - No data to display   Initial Impression / Assessment and Plan / ED Course  I have reviewed the triage vital signs and the  nursing notes.  Pertinent labs & imaging results that were available during my care of the patient were reviewed by me and considered in my medical decision making (see chart for details).   2:10 PM patient remains asymptomatic  Patient reports to me that she does have history of heart murmur Lab work remarkable for mild hypokalemia and mild anemia.  Oral potassium supplementation ordered.  Follow-up with PMD and I do not believe the patient had true syncope or near syncope symptoms lasting 10 minutes.  Possible atypical seizure, doubtful Final Clinical Impressions(s) / ED Diagnoses  Diagnosis #1 transient weakness Final diagnoses:  None  #2 anemia #3 hypokalemia  ED Discharge Orders  None       Doug SouJacubowitz, Vaughn Beaumier, MD 09/20/18 [phone]

## 2018-09-20 NOTE — ED Triage Notes (Signed)
Pt brought in by [facility] from home for near syncopal episode today around 1100. Pt states she did not pass out, did not fall or hit her head. Pt states she felt like something "was wrong". Pt states she remembers entire event, she was sitting in a chair in her living room. Pt has hx of stroke x5 years ago, state her only sx was a headache. Pt denies any HA at this time. Pt A+Ox4.

## 2018-09-20 NOTE — Discharge Instructions (Addendum)
Your lab work today shows that you are mildly anemic and blood potassium was slightly low.  You were given a dose of potassium by mouth here.  Call your primary care physician.  Return if concern for any reason

## 2018-12-06 ENCOUNTER — Observation Stay (HOSPITAL_COMMUNITY): Payer: Medicaid Other

## 2018-12-06 ENCOUNTER — Emergency Department (HOSPITAL_COMMUNITY): Payer: Medicaid Other

## 2018-12-06 ENCOUNTER — Observation Stay (HOSPITAL_COMMUNITY)
Admission: EM | Admit: 2018-12-06 | Discharge: 2018-12-07 | Disposition: A | Discharge status: Home / Self Care | Payer: Medicaid Other | Attending: Internal Medicine | Admitting: Internal Medicine

## 2018-12-06 ENCOUNTER — Other Ambulatory Visit (HOSPITAL_COMMUNITY): Payer: Self-pay

## 2018-12-06 DIAGNOSIS — D72819 Decreased white blood cell count, unspecified: Secondary | ICD-10-CM | POA: Diagnosis not present

## 2018-12-06 DIAGNOSIS — Z8673 Personal history of transient ischemic attack (TIA), and cerebral infarction without residual deficits: Secondary | ICD-10-CM | POA: Diagnosis not present

## 2018-12-06 DIAGNOSIS — Z87891 Personal history of nicotine dependence: Secondary | ICD-10-CM | POA: Diagnosis not present

## 2018-12-06 DIAGNOSIS — G8321 Monoplegia of upper limb affecting right dominant side: Secondary | ICD-10-CM

## 2018-12-06 DIAGNOSIS — Z79899 Other long term (current) drug therapy: Secondary | ICD-10-CM

## 2018-12-06 DIAGNOSIS — Z87820 Personal history of traumatic brain injury: Secondary | ICD-10-CM

## 2018-12-06 DIAGNOSIS — G3184 Mild cognitive impairment, so stated: Secondary | ICD-10-CM | POA: Insufficient documentation

## 2018-12-06 DIAGNOSIS — G459 Transient cerebral ischemic attack, unspecified: Principal | ICD-10-CM | POA: Insufficient documentation

## 2018-12-06 DIAGNOSIS — I669 Occlusion and stenosis of unspecified cerebral artery: Secondary | ICD-10-CM

## 2018-12-06 DIAGNOSIS — I708 Atherosclerosis of other arteries: Secondary | ICD-10-CM

## 2018-12-06 DIAGNOSIS — R4182 Altered mental status, unspecified: Secondary | ICD-10-CM | POA: Diagnosis present

## 2018-12-06 DIAGNOSIS — R011 Cardiac murmur, unspecified: Secondary | ICD-10-CM

## 2018-12-06 DIAGNOSIS — I350 Nonrheumatic aortic (valve) stenosis: Secondary | ICD-10-CM | POA: Insufficient documentation

## 2018-12-06 DIAGNOSIS — E876 Hypokalemia: Secondary | ICD-10-CM

## 2018-12-06 DIAGNOSIS — R4701 Aphasia: Secondary | ICD-10-CM

## 2018-12-06 DIAGNOSIS — Z21 Asymptomatic human immunodeficiency virus [HIV] infection status: Secondary | ICD-10-CM | POA: Diagnosis not present

## 2018-12-06 DIAGNOSIS — I6521 Occlusion and stenosis of right carotid artery: Secondary | ICD-10-CM

## 2018-12-06 LAB — RAPID URINE DRUG SCREEN, HOSP PERFORMED
AMPHETAMINES: NOT DETECTED
Barbiturates: NOT DETECTED
Benzodiazepines: NOT DETECTED
COCAINE: NOT DETECTED
Opiates: NOT DETECTED
Tetrahydrocannabinol: NOT DETECTED

## 2018-12-06 LAB — COMPREHENSIVE METABOLIC PANEL
ALK PHOS: 45 U/L (ref 38–126)
ALT: 50 U/L — AB (ref 0–44)
AST: 54 U/L — ABNORMAL HIGH (ref 15–41)
Albumin: 4 g/dL (ref 3.5–5.0)
Anion gap: 12 (ref 5–15)
BILIRUBIN TOTAL: 0.9 mg/dL (ref 0.3–1.2)
BUN: 12 mg/dL (ref 8–23)
CALCIUM: 9.7 mg/dL (ref 8.9–10.3)
CO2: 20 mmol/L — AB (ref 22–32)
Chloride: 106 mmol/L (ref 98–111)
Creatinine, Ser: 0.87 mg/dL (ref 0.44–1.00)
GFR calc Af Amer: >60 mL/min (ref >60)
GFR calc non Af Amer: >60 mL/min (ref >60)
GLUCOSE: 125 mg/dL — AB (ref 70–99)
Potassium: 3.2 mmol/L — ABNORMAL LOW (ref 3.5–5.1)
SODIUM: 138 mmol/L (ref 135–145)
Total Protein: 8.1 g/dL (ref 6.5–8.1)

## 2018-12-06 LAB — DIFFERENTIAL
Abs Immature Granulocytes: 0 10*3/uL (ref 0.00–0.07)
BASOS ABS: 0 10*3/uL (ref 0.0–0.1)
Basophils Relative: 1 %
Eosinophils Absolute: 0 10*3/uL (ref 0.0–0.5)
Eosinophils Relative: 0 %
LYMPHS ABS: 0.7 10*3/uL (ref 0.7–4.0)
LYMPHS PCT: 25 %
MONO ABS: 0.3 10*3/uL (ref 0.1–1.0)
Monocytes Relative: 11 %
Neutro Abs: 1.6 10*3/uL — ABNORMAL LOW (ref 1.7–7.7)
Neutrophils Relative %: 63 %
nRBC: 1 /100 WBC — ABNORMAL HIGH

## 2018-12-06 LAB — MAGNESIUM: Magnesium: 1.9 mg/dL (ref 1.7–2.4)

## 2018-12-06 LAB — CBC
HEMATOCRIT: 34.6 % — AB (ref 36.0–46.0)
HEMOGLOBIN: 11.6 g/dL — AB (ref 12.0–15.0)
MCH: 31.6 pg (ref 26.0–34.0)
MCHC: 33.5 g/dL (ref 30.0–36.0)
MCV: 94.3 fL (ref 80.0–100.0)
Platelets: 165 10*3/uL (ref 150–400)
RBC: 3.67 MIL/uL — ABNORMAL LOW (ref 3.87–5.11)
RDW: 13.1 % (ref 11.5–15.5)
WBC: 2.6 10*3/uL — AB (ref 4.0–10.5)
nRBC: 0 % (ref 0.0–0.2)

## 2018-12-06 LAB — PROTIME-INR
INR: 1.02
PROTHROMBIN TIME: 13.3 s (ref 11.4–15.2)

## 2018-12-06 LAB — APTT: aPTT: 32 seconds (ref 24–36)

## 2018-12-06 LAB — CBG MONITORING, ED: Glucose-Capillary: 97 mg/dL (ref 70–99)

## 2018-12-06 LAB — I-STAT CREATININE, ED: Creatinine, Ser: 0.8 mg/dL (ref 0.44–1.00)

## 2018-12-06 MED ORDER — SODIUM CHLORIDE 0.9% FLUSH
3.0000 mL | Freq: Once | INTRAVENOUS | Status: AC
  Administered 2018-12-06: 3 mL via INTRAVENOUS

## 2018-12-06 MED ORDER — PRAVASTATIN SODIUM 10 MG PO TABS: 20.0000 mg | ORAL_TABLET | Freq: Every day | ORAL | Status: DC

## 2018-12-06 MED ORDER — ACETAMINOPHEN 325 MG PO TABS: 650.0000 mg | ORAL_TABLET | ORAL | Status: DC | PRN

## 2018-12-06 MED ORDER — SODIUM CHLORIDE 0.9 % IV SOLN: INTRAVENOUS | Status: DC

## 2018-12-06 MED ORDER — CLOPIDOGREL BISULFATE 75 MG PO TABS
75.0000 mg | ORAL_TABLET | Freq: Every day | ORAL | Status: DC
  Administered 2018-12-06 – 2018-12-07 (×2): 75 mg via ORAL
  Filled 2018-12-06 (×2): qty 1

## 2018-12-06 MED ORDER — STROKE: EARLY STAGES OF RECOVERY BOOK
Freq: Once | Status: AC
  Administered 2018-12-06: 18:00:00
  Filled 2018-12-06: qty 1

## 2018-12-06 MED ORDER — ASPIRIN EC 81 MG PO TBEC
81.0000 mg | DELAYED_RELEASE_TABLET | Freq: Every day | ORAL | Status: DC
  Administered 2018-12-07: 81 mg via ORAL
  Filled 2018-12-06 (×2): qty 1

## 2018-12-06 MED ORDER — ACETAMINOPHEN 650 MG RE SUPP: 650.0000 mg | RECTAL | Status: DC | PRN

## 2018-12-06 MED ORDER — FOLIC ACID 1 MG PO TABS
1.0000 mg | ORAL_TABLET | Freq: Every day | ORAL | Status: DC
  Administered 2018-12-07: 1 mg via ORAL
  Filled 2018-12-06 (×2): qty 1

## 2018-12-06 MED ORDER — ENOXAPARIN SODIUM 40 MG/0.4ML ~~LOC~~ SOLN
40.0000 mg | SUBCUTANEOUS | Status: DC
  Administered 2018-12-06: 40 mg via SUBCUTANEOUS
  Filled 2018-12-06 (×2): qty 0.4

## 2018-12-06 MED ORDER — IOPAMIDOL (ISOVUE-370) INJECTION 76%
75.0000 mL | Freq: Once | INTRAVENOUS | Status: AC | PRN
  Administered 2018-12-06: 75 mL via INTRAVENOUS

## 2018-12-06 MED ORDER — VITAMIN B-1 100 MG PO TABS
100.0000 mg | ORAL_TABLET | Freq: Every day | ORAL | Status: DC
  Administered 2018-12-07: 100 mg via ORAL
  Filled 2018-12-06: qty 1

## 2018-12-06 MED ORDER — ACETAMINOPHEN 160 MG/5ML PO SOLN: 650.0000 mg | ORAL | Status: DC | PRN

## 2018-12-06 MED ORDER — POTASSIUM CHLORIDE CRYS ER 20 MEQ PO TBCR
40.0000 meq | EXTENDED_RELEASE_TABLET | Freq: Once | ORAL | Status: AC
  Administered 2018-12-06: 40 meq via ORAL
  Filled 2018-12-06: qty 2

## 2018-12-06 MED ORDER — ASPIRIN EC 325 MG PO TBEC
325.0000 mg | DELAYED_RELEASE_TABLET | Freq: Every day | ORAL | Status: AC
  Administered 2018-12-06: 325 mg via ORAL
  Filled 2018-12-06: qty 1

## 2018-12-06 MED ORDER — SENNOSIDES-DOCUSATE SODIUM 8.6-50 MG PO TABS
1.0000 | ORAL_TABLET | Freq: Every day | ORAL | Status: DC
  Administered 2018-12-06: 1 via ORAL
  Filled 2018-12-06: qty 1

## 2018-12-06 NOTE — ED Provider Notes (Signed)
[facility] EMERGENCY DEPARTMENT Provider Note   CSN: [id number] Arrival date & time: 12/06/18  1201  An emergency department physician performed an initial assessment on this suspected stroke patient at 1203.  History   Chief Complaint Chief Complaint  Patient presents with  . Code Stroke   Level 5 caveat: Mixed aphasia HPI PANHIA KARL is a 63 y.o. female.     HPI Patient is a 63 year old female who lives independently who began having difficulty finding her words.  She went down to the lobby and EMS was called.  On EMS arrival the patient was having difficulty following commands and had delayed responses.  No headache.  Not found to have any weakness of her arms or legs.  Brought to the emergency department as a code stroke secondary to expressive and receptive aphasia.  No chest pain or abdominal pain.  No back pain.  Patient was in her normal state of health reportedly   Past Medical History:  Diagnosis Date  . Diverticulitis   . Hypertension    not on medications  . Pancreatitis   . Stroke [facility])     Patient Active Problem List   Diagnosis Date Noted  . ICH (intracerebral hemorrhage) (HCC) 05/23/2014  . Diverticulitis of colon 02/24/2013  . Pancreatitis, Hx of 02/24/2013  . Alcohol abuse 02/24/2013    Past Surgical History:  Procedure Laterality Date  . FLEXIBLE SIGMOIDOSCOPY N/A 02/26/2013   Procedure: FLEXIBLE SIGMOIDOSCOPY;  Surgeon: Theda Belfast, MD;  Location: [facility] ENDOSCOPY;  Service: Endoscopy;  Laterality: N/A;     OB History   No obstetric history on file.      Home Medications    Prior to Admission medications   Medication Sig Start Date End Date Taking? Authorizing Provider  amLODipine (NORVASC) 10 MG tablet Take 10 mg by mouth daily.    [provider]  hydrochlorothiazide (MICROZIDE) 12.5 MG capsule Take 1 capsule (12.5 mg total) by mouth daily. 05/26/14   [name], MD  ibuprofen (ADVIL,MOTRIN) 600 MG  tablet Take 1 tablet (600 mg total) by mouth every 6 (six) hours as needed. 07/12/15   Antony Madura, PA-C    Family History Family History  Problem Relation Age of Onset  . Healthy Father   . Cancer Mother        passed away. unknown age  . Hypertension Sister   . Diabetes Mellitus II Sister     Social History Social History   Tobacco Use  . Smoking status: Former Smoker    Packs/day: 0.50    Years: 30.00    Pack years: 15.00    Types: Cigarettes    Last attempt to quit: 05/22/2015    Years since quitting: 3.5  . Smokeless tobacco: Never Used  Substance Use Topics  . Alcohol use: No    Alcohol/week: 0.0 standard drinks  . Drug use: Yes    Types: Marijuana     Allergies   Patient has no known allergies.   Review of Systems Review of Systems  Unable to perform ROS: Other     Physical Exam Updated Vital Signs BP (!) 149/63 (BP Location: Right Arm)   Pulse 74   Temp 97.6 F (36.4 C) (Oral)   Resp 12   Wt 54.4 kg   SpO2 100%   BMI 19.36 kg/m   Physical Exam Vitals signs and nursing note reviewed.  Constitutional:      General: She is not in acute distress.  Appearance: She is well-developed.  HENT:     Head: Normocephalic and atraumatic.  Eyes:     Pupils: Pupils are equal, round, and reactive to light.  Neck:     Musculoskeletal: Normal range of motion.  Cardiovascular:     Rate and Rhythm: Normal rate and regular rhythm.     Heart sounds: Normal heart sounds.  Pulmonary:     Effort: Pulmonary effort is normal.     Breath sounds: Normal breath sounds.  Abdominal:     General: There is no distension.     Palpations: Abdomen is soft.     Tenderness: There is no abdominal tenderness.  Musculoskeletal: Normal range of motion.  Skin:    General: Skin is warm and dry.  Neurological:     Mental Status: She is alert and oriented to person, place, and time.     Comments: No obvious facial asymmetry.  No dysarthria appreciated.  Mild intermittent  expressive aphasia5/5 strength in major muscle groups of  bilateral upper and lower extremities. Marland Kitchen   Psychiatric:        Judgment: Judgment normal.      ED Treatments / Results  Labs (all labs ordered are listed, but only abnormal results are displayed) Labs Reviewed  CBC - Abnormal; Notable for the following components:      Result Value   WBC 2.6 (*)    RBC 3.67 (*)    Hemoglobin 11.6 (*)    HCT 34.6 (*)    All other components within normal limits  COMPREHENSIVE METABOLIC PANEL - Abnormal; Notable for the following components:   Potassium 3.2 (*)    CO2 20 (*)    Glucose, Bld 125 (*)    AST 54 (*)    ALT 50 (*)    All other components within normal limits  PROTIME-INR  APTT  DIFFERENTIAL  CBG MONITORING, ED  I-STAT CREATININE, ED    EKG EKG Interpretation  Date/Time:  Friday December 06 2018 12:45:56 EST Ventricular Rate:  74 PR Interval:    QRS Duration: 109 QT Interval:  421 QTC Calculation: 468 R Axis:   62 Text Interpretation:  Sinus rhythm Probable left atrial enlargement No significant change was found Confirmed by Azalia Bilis ([id number]) on 12/06/2018 1:04:29 PM   Radiology Ct Angio Head W Or Wo Contrast  Result Date: 12/06/2018 CLINICAL DATA:  Follow-up TIA.  History of hypertension and stroke. EXAM: CT ANGIOGRAPHY HEAD AND NECK TECHNIQUE: Multidetector CT imaging of the head and neck was performed using the standard protocol during bolus administration of intravenous contrast. Multiplanar CT image reconstructions and MIPs were obtained to evaluate the vascular anatomy. Carotid stenosis measurements (when applicable) are obtained utilizing NASCET criteria, using the distal internal carotid diameter as the denominator. CONTRAST:  75mL ISOVUE-370 IOPAMIDOL (ISOVUE-370) INJECTION 76% COMPARISON:  CT HEAD December 06, 2018 at 1208 hours FINDINGS: CTA NECK FINDINGS: AORTIC ARCH: 2 vessel arch is a normal variant. Moderate calcific atherosclerosis aortic arch. The  origins of the innominate, left Common carotid artery and subclavian artery are patent, mild luminal irregularity subclavian artery consistent with atherosclerosis. RIGHT CAROTID SYSTEM: Common carotid artery is patent, intimal thickening resulting in 50% stenosis proximal common carotid artery. Tandem severe stenosis RIGHT common carotid artery with disproportionate decreased contrast opacification. Severe calcific atherosclerosis RIGHT carotid bifurcation with short segment critical stenosis. Patent cervical internal carotid artery with robust contrast opacification consistent with retrograde flow. LEFT CAROTID SYSTEM: Common carotid artery is patent, moderate calcific atherosclerosis without stenosis.  Mild calcific atherosclerosis of the carotid bifurcation without hemodynamically significant stenosis by NASCET criteria. Normal appearance of the internal carotid artery. VERTEBRAL ARTERIES:RIGHT vertebral artery is dominant. Calcific atherosclerosis resulting in moderate stenosis bilateral V1 segments. SKELETON: No acute osseous process though bone windows have not been submitted. Poor dentition. OTHER NECK: Soft tissues of the neck are nonacute though, not tailored for evaluation. UPPER CHEST: Included lung apices are clear. Mild centrilobular emphysema. No superior mediastinal lymphadenopathy. CTA HEAD FINDINGS: ANTERIOR CIRCULATION: Patent cervical internal carotid arteries, petrous, cavernous and supra clinoid internal carotid arteries, asymmetrically decreased RIGHT contrast opacification. Patent anterior communicating artery. Patent anterior and middle cerebral arteries. No large vessel occlusion, flow-limiting stenosis, contrast extravasation or aneurysm. POSTERIOR CIRCULATION: Patent vertebral arteries, vertebrobasilar junction and basilar artery, as well as main branch vessels. Patent posterior cerebral arteries. Severe stenosis bilateral P2 segments. No large vessel occlusion, flow-limiting stenosis,  contrast extravasation or aneurysm. VENOUS SINUSES: Major dural venous sinuses are patent though not tailored for evaluation on this angiographic examination. ANATOMIC VARIANTS: None. DELAYED PHASE: Not performed. MIP images reviewed. IMPRESSION: CTA NECK: 1. Critical stenosis RIGHT ICA with tandem severe RIGHT CCA stenosis. 2. Patent bilateral vertebral arteries with moderate V1 stenosis. CTA HEAD: 1. No emergent large vessel occlusion. Severe stenosis bilateral P2 segments. Aortic Atherosclerosis (ICD10-I70.0). Electronically Signed   By: Awilda Metro M.D.   On: 12/06/2018 12:59   Ct Angio Neck W Or Wo Contrast  Result Date: 12/06/2018 CLINICAL DATA:  Follow-up TIA.  History of hypertension and stroke. EXAM: CT ANGIOGRAPHY HEAD AND NECK TECHNIQUE: Multidetector CT imaging of the head and neck was performed using the standard protocol during bolus administration of intravenous contrast. Multiplanar CT image reconstructions and MIPs were obtained to evaluate the vascular anatomy. Carotid stenosis measurements (when applicable) are obtained utilizing NASCET criteria, using the distal internal carotid diameter as the denominator. CONTRAST:  7mL ISOVUE-370 IOPAMIDOL (ISOVUE-370) INJECTION 76% COMPARISON:  CT HEAD December 06, 2018 at 1208 hours FINDINGS: CTA NECK FINDINGS: AORTIC ARCH: 2 vessel arch is a normal variant. Moderate calcific atherosclerosis aortic arch. The origins of the innominate, left Common carotid artery and subclavian artery are patent, mild luminal irregularity subclavian artery consistent with atherosclerosis. RIGHT CAROTID SYSTEM: Common carotid artery is patent, intimal thickening resulting in 50% stenosis proximal common carotid artery. Tandem severe stenosis RIGHT common carotid artery with disproportionate decreased contrast opacification. Severe calcific atherosclerosis RIGHT carotid bifurcation with short segment critical stenosis. Patent cervical internal carotid artery with  robust contrast opacification consistent with retrograde flow. LEFT CAROTID SYSTEM: Common carotid artery is patent, moderate calcific atherosclerosis without stenosis. Mild calcific atherosclerosis of the carotid bifurcation without hemodynamically significant stenosis by NASCET criteria. Normal appearance of the internal carotid artery. VERTEBRAL ARTERIES:RIGHT vertebral artery is dominant. Calcific atherosclerosis resulting in moderate stenosis bilateral V1 segments. SKELETON: No acute osseous process though bone windows have not been submitted. Poor dentition. OTHER NECK: Soft tissues of the neck are nonacute though, not tailored for evaluation. UPPER CHEST: Included lung apices are clear. Mild centrilobular emphysema. No superior mediastinal lymphadenopathy. CTA HEAD FINDINGS: ANTERIOR CIRCULATION: Patent cervical internal carotid arteries, petrous, cavernous and supra clinoid internal carotid arteries, asymmetrically decreased RIGHT contrast opacification. Patent anterior communicating artery. Patent anterior and middle cerebral arteries. No large vessel occlusion, flow-limiting stenosis, contrast extravasation or aneurysm. POSTERIOR CIRCULATION: Patent vertebral arteries, vertebrobasilar junction and basilar artery, as well as main branch vessels. Patent posterior cerebral arteries. Severe stenosis bilateral P2 segments. No large vessel occlusion, flow-limiting stenosis, contrast extravasation  or aneurysm. VENOUS SINUSES: Major dural venous sinuses are patent though not tailored for evaluation on this angiographic examination. ANATOMIC VARIANTS: None. DELAYED PHASE: Not performed. MIP images reviewed. IMPRESSION: CTA NECK: 1. Critical stenosis RIGHT ICA with tandem severe RIGHT CCA stenosis. 2. Patent bilateral vertebral arteries with moderate V1 stenosis. CTA HEAD: 1. No emergent large vessel occlusion. Severe stenosis bilateral P2 segments. Aortic Atherosclerosis (ICD10-I70.0). Electronically Signed   By:  Awilda Metroourtnay  Bloomer M.D.   On: 12/06/2018 12:59   Ct Head Code Stroke Wo Contrast  Result Date: 12/06/2018 CLINICAL DATA:  Code stroke. Difficulty speaking and making words. Last seen normal 2.5 Hours ago. EXAM: CT HEAD WITHOUT CONTRAST TECHNIQUE: Contiguous axial images were obtained from the base of the skull through the vertex without intravenous contrast. COMPARISON:  CT head without contrast 08/11/2014 FINDINGS: Brain: Focal area of cortical infarct is present in the high right parietal lobe. Acute or focal cortical abnormality is present along the central sulcus on either side. No focal cortical abnormalities are present along the ganglionic or super ganglionic cortex. Remote lacunar infarcts are present in the thalami bilaterally and in the caudate head. Other smaller remote lacunar infarcts are present within the basal ganglia bilaterally, similar the prior study. White matter changes extend into the brainstem. The cerebellum is normal. The ventricles are of proportionate to the degree of atrophy. No significant extraaxial fluid collection is present. Vascular: Extensive atherosclerotic calcifications are present. There is no hyperdense vessel. Skull: Calvarium is intact. No focal lytic or blastic lesions are present. Sinuses/Orbits: The paranasal sinuses and mastoid air cells are clear. The globes and orbits are within normal limits. ASPECTS [facility]([place] Stroke Program Early CT Score) - Ganglionic level infarction (caudate, lentiform nuclei, internal capsule, insula, M1-M3 cortex): 7/7 - Supraganglionic infarction (M4-M6 cortex): 3/3 Total score (0-10 with 10 being normal): 10/10 IMPRESSION: 1. High right parietal cortical nonhemorrhagic infarct, separate from the central sulcus. This is age indeterminate and may be acute. 2. No other acute cortical infarct. 3. Remote lacunar infarcts of the basal ganglia and thalami bilaterally. 4. Moderate generalized atrophy and white matter disease otherwise 5. ASPECTS is  10/10 The above was relayed via text pager to Dr. Amada JupiterKirkpatrick on 12/06/2018 at 12:16 . Electronically Signed   By: Marin Robertshristopher  Mattern M.D.   On: 12/06/2018 12:17    Procedures .Critical Care Performed by: Azalia Bilisampos, Quincey Nored, MD Authorized by: Azalia Bilisampos, Jenavee Laguardia, MD   Critical care provider statement:    Critical care time (minutes):  31   Critical care was time spent personally by me on the following activities:  Discussions with consultants, evaluation of patient's response to treatment, examination of patient, ordering and performing treatments and interventions, ordering and review of laboratory studies, ordering and review of radiographic studies, pulse oximetry, re-evaluation of patient's condition, obtaining history from patient or surrogate and review of old charts   (including critical care time)  Medications Ordered in ED Medications  sodium chloride flush (NS) 0.9 % injection 3 mL (3 mLs Intravenous Given 12/06/18 1223)  iopamidol (ISOVUE-370) 76 % injection 75 mL (75 mLs Intravenous Contrast Given 12/06/18 1225)     Initial Impression / Assessment and Plan / ED Course  I have reviewed the triage vital signs and the nursing notes.  Pertinent labs & imaging results that were available during my care of the patient were reviewed by me and considered in my medical decision making (see chart for details).        Neurologic exam improving here in  the emergency department.  Suspect to be small stroke versus TIA by neurology.  Please see neuro hospitalist consultation note for complete details.  Patient protecting her airway.  Speech seems to be improving.  No focus complaints at this time.  Admit to the hospital for ongoing stroke/TIA work-up  Final Clinical Impressions(s) / ED Diagnoses   Final diagnoses:  TIA (transient ischemic attack)    ED Discharge Orders    None       Azalia Bilis, MD 12/06/18 1310

## 2018-12-06 NOTE — Consult Note (Addendum)
NEURO HOSPITALIST  CONSULT   Requesting Physician: Dr. Patria Manecampos    Chief Complaint:  Difficulty speaking  History obtained from:  Patient  / EMS  HPI:                                                                                                                                         Lindsey Leon is an 63 y.o. female  With PMH ICH ( 2015), HTN who [facility] as a code stroke with c/o difficulty speaking.  Patient states that she woke up this morning and was her usual self. About 0945 when she was in the kitchen she was holding a pot in her right hand and dropped it. She also was talking to herself and began having trouble talking.  She says that this lasted 10- 15 minutes. Patient is having trouble describing her symptoms.   ED course:  CTH: no hemorrhage. High right parietal cortical nonhemorrhagic infarct age indeterminate. Bilateral lacunar infarcts BG and thalamus.  05/2014: left temporal hemorrhage.  54.4 kg   Date last known well:12-06-18 Time last known well: 0945 tPA Given: No: previous hemorrhage  Modified Rankin: Rankin Score=0 NIHSS:3   Past Medical History:  Diagnosis Date  . Diverticulitis   . Hypertension    not on medications  . Pancreatitis   . Stroke [facility]HCC)     Past Surgical History:  Procedure Laterality Date  . FLEXIBLE SIGMOIDOSCOPY N/A 02/26/2013   Procedure: FLEXIBLE SIGMOIDOSCOPY;  Surgeon: Theda BelfastPatrick D Hung, MD;  [facility];  Service: Endoscopy;  Laterality: N/A;    Family History  Problem Relation Age of Onset  . Healthy Father   . Cancer Mother        passed away. unknown age  . Hypertension Sister   . Diabetes Mellitus II Sister    Social History:  reports that she quit smoking about 3 years ago. Her smoking use included cigarettes. She has a 15.00 pack-year smoking history. She has never used smokeless tobacco. She reports current drug use. Drug: Marijuana. She reports that  she does not drink alcohol.  Allergies: No Known Allergies  Medications:  No current facility-administered medications for this encounter.    Current Outpatient Medications  Medication Sig Dispense Refill  . amLODipine (NORVASC) 10 MG tablet Take 10 mg by mouth daily.    . hydrochlorothiazide (MICROZIDE) 12.5 MG capsule Take 1 capsule (12.5 mg total) by mouth daily. 60 capsule 1  . ibuprofen (ADVIL,MOTRIN) 600 MG tablet Take 1 tablet (600 mg total) by mouth every 6 (six) hours as needed. 30 tablet 0     ROS:                                                                                                                                       ROS was performed and is negative except as noted in HPI  General Examination:                                                                                                      There were no vitals taken for this visit.  HEENT-  Normocephalic, no lesions, without obvious abnormality.  Normal external eye and conjunctiva. Cardiovascular- S1-S2 audible, pulses palpable throughout  Lungs-no rhonchi or wheezing noted, no excessive working breathing.  Saturations within normal limits on RA Extremities- Warm, dry and intact Musculoskeletal-no joint tenderness, deformity or swelling Skin-warm and dry intact  Neurological Examination Mental Status: Alert, oriented, thought content appropriate.  Mild aphasia noted. Mild perseveration.  Able to follow  commands without difficulty. Cranial Nerves: WU:[id number] fields grossly normal,  III,IV, VI: ptosis not present, extra-ocular motions intact bilaterally, pupils equal, round, reactive to light and accommodation V,VII: smile symmetric, facial light touch sensation normal bilaterally VIII: hearing normal bilaterally IX,X: uvula rises midline XI: bilateral shoulder shrug XII: midline  tongue extension Motor: Right : Upper extremity   5/5  Left:     Upper extremity   5/5  Lower extremity   5/5   Lower extremity   5/5 Tone and bulk:normal tone throughout; no atrophy noted Sensory: Pinprick and light touch intact throughout, bilaterally Deep Tendon Reflexes: 2+ and symmetric  patella , right biceps 3+ left bicep 2+ Plantars: Right: downgoing   Left: downgoing Cerebellar: normal finger-to-nose,  normal heel-to-shin test Gait: deferred   Lab Results: Basic Metabolic Panel: Recent Labs  Lab 12/06/18 1213  CREATININE 0.80   CBG: Recent Labs  Lab 12/06/18 1204  GLUCAP 97    Imaging: Ct Head Code Stroke Wo Contrast  Result Date: 12/06/2018 CLINICAL DATA:  Code stroke. Difficulty speaking and making words. Last seen normal 2.5  Hours ago. EXAM: CT HEAD WITHOUT CONTRAST TECHNIQUE: Contiguous axial images were obtained from the base of the skull through the vertex without intravenous contrast. COMPARISON:  CT head without contrast 08/11/2014 FINDINGS: Brain: Focal area of cortical infarct is present in the high right parietal lobe. Acute or focal cortical abnormality is present along the central sulcus on either side. No focal cortical abnormalities are present along the ganglionic or super ganglionic cortex. Remote lacunar infarcts are present in the thalami bilaterally and in the caudate head. Other smaller remote lacunar infarcts are present within the basal ganglia bilaterally, similar the prior study. White matter changes extend into the brainstem. The cerebellum is normal. The ventricles are of proportionate to the degree of atrophy. No significant extraaxial fluid collection is present. Vascular: Extensive atherosclerotic calcifications are present. There is no hyperdense vessel. Skull: Calvarium is intact. No focal lytic or blastic lesions are present. Sinuses/Orbits: The paranasal sinuses and mastoid air cells are clear. The globes and orbits are within normal limits.  ASPECTS [facility] Stroke Program Early CT Score) - Ganglionic level infarction (caudate, lentiform nuclei, internal capsule, insula, M1-M3 cortex): 7/7 - Supraganglionic infarction (M4-M6 cortex): 3/3 Total score (0-10 with 10 being normal): 10/10 IMPRESSION: 1. High right parietal cortical nonhemorrhagic infarct, separate from the central sulcus. This is age indeterminate and may be acute. 2. No other acute cortical infarct. 3. Remote lacunar infarcts of the basal ganglia and thalami bilaterally. 4. Moderate generalized atrophy and white matter disease otherwise 5. ASPECTS is 10/10 The above was relayed via text pager to Dr. Amada Jupiter on 12/06/2018 at 12:16 . Electronically Signed   By: Marin Roberts M.D.   On: 12/06/2018 12:17       Valentina Lucks, MSN, NP-C [facility] [phone]  12/06/2018, 12:08 PM   Attending physician note to follow with Assessment and plan .   Assessment: 63 y.o. female With PMH ICH ( 2015), HTN who presented to [facility] ED as a code stroke with transient difficulty speaking and's right-sided weakness.  There was no apparent change of consciousness reported which makes me think that this was most likely TIA.  Given her history of hemorrhagic stroke in the left temporal region, I do think an EEG would be reasonable, however if this does not reveal evidence of cortical irritability then I treat this as TIA. Stroke Risk Factors - hypertension    Recommendations: --MRI Brain  --CTA --Echocardiogram --ASA --EEG -- High intensity Statin If LDL > 70 -- HgbA1c, fasting lipid panel -- PT consult, OT consult, Speech consult --Telemetry monitoring --Frequent neuro checks --Stroke swallow screen     --please page stroke NP  Or  PA  Or MD from 8am -4 pm  as this patient from this time will be  followed by the stroke.   You can look them up on [email or web address]  Password [email or web address]  Ritta Slot, MD [facility] [phone]  If 7pm- 7am,  please page neurology on call as listed in AMION.

## 2018-12-06 NOTE — H&P (Addendum)
Date: 12/06/2018               Patient Name:  Lindsey PostLinda M Ullom MRN: [id number]  DOB: [birth year] Age / Sex: 63 y.o., female   PCP: Default, Provider, MD         Medical Service: Internal Medicine Teaching Service         Attending Physician: Dr. Doneen PoissonKlima, Lawrence, MD    First Contact: Dr. Maryla MorrowMasoudi Pager: [phone]  Second Contact: Dr. Frances FurbishWinfrey Pager: [phone]       After Hours (After 5p/  First Contact Pager: [phone]  weekends / holidays): Second Contact Pager: [phone]   Chief Complaint: Right as well as weakness  History of Present Illness: 63 year old lady with past medical history of stroke (without major deficit), HTN, former smoker, former alcohol use, presented with right arm and hand weakness this morning.  Patient was able to answer all of our questions at time of admission.  Patient lives independently.  She came home today after shopping, and when she was trying to open the door, she noticed difficulty turning the key. She finally went in to her apartment. She then tried to boil some water but when carrying the pot, she dropped that. She had difficulty expressing the words.  She denied any headache, dizziness, gait or balance problem, vomiting. Also no hx of palpitation. She called 911. On EMS arrival the patient was having difficulty following commands and had delayed responses but was not reported to have weakness on exam.   She was brought to the ED with code stroke secondary to aphasia. After arrival, she initially continued to have aphasia. But this resolved and she cam back to her baseline afterwards and the time that IM team saw the patient.  Meds:  Current Meds  Medication Sig  . amLODipine (NORVASC) 5 MG tablet Take 5 mg by mouth daily.  . hydrochlorothiazide (MICROZIDE) 12.5 MG capsule Take 1 capsule (12.5 mg total) by mouth daily.  . pravastatin (PRAVACHOL) 20 MG tablet Take 20 mg by mouth at bedtime.     Allergies: Allergies as of 12/06/2018  . (No Known  Allergies)   Past Medical History:  Diagnosis Date  . Diverticulitis   . Hypertension    not on medications  . Pancreatitis   . Stroke [facility])     Family History: Denies any family history of stroke, cardiac disease, sudden cardiac death, DM or HTN Sister with DM and HTN per chart  Social History: Patient lives alone She quit smoking 3 years ago.  Also.  Drinking few years ago.  Smoked marijuana regularly.  Review of Systems: A complete ROS was negative except as per HPI.   Physical Exam: Blood pressure (!) 141/67, pulse 70, temperature 97.6 F (36.4 C), temperature source Oral, resp. rate 16, weight 54.4 kg, SpO2 100 %. Physical Exam Constitutional:      General: She is not in acute distress.    Appearance: She is not ill-appearing.  HENT:     Head: Normocephalic and atraumatic.     Mouth/Throat:     Mouth: Mucous membranes are moist.  Eyes:     Extraocular Movements: Extraocular movements intact.     Pupils: Pupils are equal, round, and reactive to light.  Cardiovascular:     Rate and Rhythm: Normal rate and regular rhythm.     Pulses: Normal pulses.     Heart sounds: Murmur present.     Comments: III/VI systolic murmur, with extension to neck. Pulmonary:  Effort: Pulmonary effort is normal. No respiratory distress.     Breath sounds: Normal breath sounds. No wheezing or rales.  Abdominal:     General: Bowel sounds are normal.     Palpations: Abdomen is soft.     Tenderness: There is no abdominal tenderness.  Musculoskeletal:        General: No swelling or tenderness.     Right lower leg: No edema.     Left lower leg: No edema.  Neurological:     Mental Status: She is alert.     Cranial Nerves: No cranial nerve deficit.     Sensory: No sensory deficit.     Motor: No weakness.     Coordination: Coordination normal.     Comments: Oriented to place, person but not date.  Romberg test is negative. Finger to nose and heel to shin exam is normal. Has brisk but  symmetric reflexes.  Psychiatric:        Mood and Affect: Mood normal.        Behavior: Behavior normal.        Judgment: Judgment normal.    Labs: Metabolic panel with hypokalemia at 3.2, mild transaminitis: AST: 54 and ALT:50 , mild metabolic acidosis (bicarb 20 on CMP). INR normal at 1.02.   EKG:  personally reviewed my interpretation is NSR, poor R progression  CT head: 1. High right parietal cortical nonhemorrhagic infarct, separate from the central sulcus. This is age indeterminate and may be acute. 3. Remote lacunar infarcts of the basal ganglia and thalami bilaterally. 4. Moderate generalized atrophy and white matter disease otherwise  CT angio head and neck:  Critical stenosis RIGHT ICA with tandem severe RIGHT CCA Stenosis and severe stenosis bilateral P2 segments.  Assessment & Plan by Problem: Active Problems:   TIA (transient ischemic attack)  Ms. Jaques is a 63 y/o f with PMHx of stroke, ICH 2015, HTN, (previous) alcohol use disorder, also former smoker , presented with episode of transient right hand weakness following by dysartheria.   TIA vs Stroke: Patient with previous Hx of stroke, not on anti-plt therapy, came with transient rt arm weakness and neurologic deficit.   CT head  Showed: High right parietal cortical nonhemorrhagic infarct. This is age indeterminate and may be acute per radiologist. CT angios head and neck showed sever stenosis.  Will obtain MRI. She is on Statin but unclear if she takes it. Last lipid panel (2015) with normal LDL at 63 and HDL 32. Will repeat today.  She came back to her normal while on ED. Had normal neurologic exam when we admit the patient. We will continue full stroke work up.   -NPO -Got 325 mg ASA and starting ASA 81 mg QD -Echocardiogram -EEG -Cardiac monitoring -Continuous pulse ox -Brain MRI without contrast -Hemoglobin A1c -Lipid panel -HIV antibody -Thiamin tablet 100 mg QD -Fall precaution -2 li Nasal  O2 if O2sat<94% -Stroke swallow screen -PT/OT eval and treat -Folic acid tab 1mg  QD -B12 level -Paravstatin    Hypokalemia at 3.2  Low bicarb at 20  Patient with K 3.2 on arrival. Not on diuretic. No Hx of  Vomiting or diarrhea (considering low Hco3) Will replet and repeat BMP toorrow.   -Kdur 40 meq once -Repeat BMP tomorrow  HTN: On Amlodidipine 5 mg QD and HCTZ 12.5 mg QD -Permissive HTN.  -Holding home Amlodipine and HCTZ   Diet: NPO then Regular diet when passes swallow study IV fluid: None VTE ppx: Lovenox Code status:  Full  Dispo: Admit patient to Observation with expected length of stay less than 2 midnights.  SignedChevis Pretty, MD 12/06/2018, 2:14 PM  Pager: [phone]

## 2018-12-06 NOTE — ED Notes (Signed)
EEG in progress 

## 2018-12-06 NOTE — Progress Notes (Signed)
EEG Completed; Results Pending  

## 2018-12-06 NOTE — Procedures (Signed)
History: 63 year old female being evaluated for transient right-sided weakness and aphasia  Sedation: None  Technique: This is a 21 channel routine scalp EEG performed at the bedside with bipolar and monopolar montages arranged in accordance to the international 10/20 system of electrode placement. One channel was dedicated to EKG recording.    Background: There is a posterior dominant rhythm of 8 Hz as well as generalized irregular theta activity even during maximal wakefulness.  There is also an increase in delta associated with drowsiness, the sleep is not recorded.  Photic stimulation: Physiologic driving is not performed  EEG Abnormalities: 1) mild generalized irregular slow activity  Clinical Interpretation: This EEG is consistent with a mild generalized nonspecific cerebral dysfunction (encephalopathy). There was no seizure or seizure predisposition recorded on this study. Please note that lack of epileptiform activity on EEG does not preclude the possibility of epilepsy.   Ritta Slot, MD [facility] [phone]  If 7pm- 7am, please page neurology on call as listed in [facility].

## 2018-12-06 NOTE — Code Documentation (Signed)
62yo female arriving to [facility] via [facility] at 1201. Patient from home where she self-reported a [name] at 0945 this morning. She reported feeling "sick" stating she had difficulty speaking and dropped something out of her right hand. Of note, she was home alone and was reportedly speaking to herself at that time. EMS assessed aphasia and difficulty following commands and activated a code stroke. Stroke team at the bedside on patient arrival. Labs drawn and patient cleared for CT by EDP. Patient to CT with team. CT completed. NIHSS 3, see documentation for details and code stroke times. Patient unable to answer NIHSS questions correctly and has difficulty answering questions and at times completing tasks. CTA completed. No acute stroke treatment at this time. Patient is contraindicated for tPA d/t h/o ICH in 2015. Patient to be admitted for stroke/TIA work-up. Handoff with ED RN Casimiro Needle.

## 2018-12-06 NOTE — ED Triage Notes (Signed)
Pt arrived via [facility] from home where pt lives alone. Pt self reports difficulty finding words when speaking. EMS reports pt unable to follow simple commands and delay with responses. No weakness noted at time of triage. Pt is alert but orientation is questionable.

## 2018-12-06 NOTE — ED Notes (Signed)
Pt transported to MRI. MRI agreed to transport pt to 3W31 following scan.

## 2018-12-06 NOTE — ED Notes (Signed)
ED TO INPATIENT HANDOFF REPORT  ED Nurse Name and Phone #: Kathlene November [phone]  S Name/Age/Gender Lindsey Leon 63 y.o. female Room/Bed: OTFC/OTF  Code Status   Code Status: Full Code  Home/SNF/Other Home Patient oriented to: self, place, time and situation Is this baseline? Yes   Triage Complete: Triage complete  Chief Complaint CODE STROKE  Triage Note Pt arrived via [facility] from home where pt lives alone. Pt self reports difficulty finding words when speaking. EMS reports pt unable to follow simple commands and delay with responses. No weakness noted at time of triage. Pt is alert but orientation is questionable.   Allergies No Known Allergies  Level of Care/Admitting Diagnosis ED Disposition    ED Disposition Condition Comment   Admit  Hospital Area: [facility] [[id number]]  Level of Care: Medical Telemetry [104]  Diagnosis: TIA (transient ischemic attack) [003491]  Admitting Physician: Doneen Poisson [[id number]]  Attending Physician: Doneen Poisson [[id number]]  PT Class (Do Not Modify): Observation [104]  PT Acc Code (Do Not Modify): Observation [10022]       B Medical/Surgery History Past Medical History:  Diagnosis Date  . Diverticulitis   . Hypertension    not on medications  . Pancreatitis   . Stroke [facility])    Past Surgical History:  Procedure Laterality Date  . FLEXIBLE SIGMOIDOSCOPY N/A 02/26/2013   Procedure: FLEXIBLE SIGMOIDOSCOPY;  Surgeon: Theda Belfast, MD;  Location: [facility] ENDOSCOPY;  Service: Endoscopy;  Laterality: N/A;     A IV Location/Drains/Wounds Patient Lines/Drains/Airways Status   Active Line/Drains/Airways    Name:   Placement date:   Placement time:   Site:   Days:   Peripheral IV 12/06/18 Anterior;Right Forearm   12/06/18    1122    Forearm   less than 1          Intake/Output Last 24 hours No intake or output data in the 24 hours ending 12/06/18 1558  Labs/Imaging Results for orders placed or performed during the  hospital encounter of 12/06/18 (from the past 48 hour(s))  CBG monitoring, ED     Status: None   Collection Time: 12/06/18 12:04 PM  Result Value Ref Range   Glucose-Capillary 97 70 - 99 mg/dL  Protime-INR     Status: None   Collection Time: 12/06/18 12:05 PM  Result Value Ref Range   Prothrombin Time 13.3 11.4 - 15.2 seconds   INR 1.02     Comment: Performed at [facility], [address], [place], [place] [address]  APTT     Status: None   Collection Time: 12/06/18 12:05 PM  Result Value Ref Range   aPTT 32 24 - 36 seconds    Comment: Performed at [facility], [address] [place], [place] [address]  CBC     Status: Abnormal   Collection Time: 12/06/18 12:05 PM  Result Value Ref Range   WBC 2.6 (L) 4.0 - 10.5 K/uL   RBC 3.67 (L) 3.87 - 5.11 MIL/uL   Hemoglobin 11.6 (L) 12.0 - 15.0 g/dL   HCT 48.0 (L) 16.5 - 53.7 %   MCV 94.3 80.0 - 100.0 fL   MCH 31.6 26.0 - 34.0 pg   MCHC 33.5 30.0 - 36.0 g/dL   RDW 48.2 70.7 - 86.7 %   Platelets 165 150 - 400 K/uL   nRBC 0.0 0.0 - 0.2 %    Comment: Performed at [facility], [address], [place],  [place] [address]  Differential     Status: Abnormal   Collection Time: 12/06/18 12:05 PM  Result Value Ref Range   Neutrophils Relative % 63 %   Neutro Abs 1.6 (L) 1.7 - 7.7 K/uL   Lymphocytes Relative 25 %   Lymphs Abs 0.7 0.7 - 4.0 K/uL   Monocytes Relative 11 %   Monocytes Absolute 0.3 0.1 - 1.0 K/uL   Eosinophils Relative 0 %   Eosinophils Absolute 0.0 0.0 - 0.5 K/uL   Basophils Relative 1 %   Basophils Absolute 0.0 0.0 - 0.1 K/uL   nRBC 1 (H) 0 /100 WBC   Abs Immature Granulocytes 0.00 0.00 - 0.07 K/uL    Comment: Performed at [facility], [address] [place], [place] [address]  Comprehensive metabolic panel     Status: Abnormal   Collection Time: 12/06/18 12:05 PM  Result Value Ref Range   Sodium 138 135 - 145 mmol/L   Potassium 3.2 (L) 3.5 - 5.1 mmol/L   Chloride 106 98 - 111 mmol/L   CO2 20  (L) 22 - 32 mmol/L   Glucose, Bld 125 (H) 70 - 99 mg/dL   BUN 12 8 - 23 mg/dL   Creatinine, Ser 0.98 0.44 - 1.00 mg/dL   Calcium 9.7 8.9 - 11.9 mg/dL   Total Protein 8.1 6.5 - 8.1 g/dL   Albumin 4.0 3.5 - 5.0 g/dL   AST 54 (H) 15 - 41 U/L   ALT 50 (H) 0 - 44 U/L   Alkaline Phosphatase 45 38 - 126 U/L   Total Bilirubin 0.9 0.3 - 1.2 mg/dL   GFR calc non Af Amer >60 >60 mL/min   GFR calc Af Amer >60 >60 mL/min   Anion gap 12 5 - 15    Comment: Performed at [facility], [address], [place], [place] [address]  I-Stat Creatinine, ED (not at [facility])     Status: None   Collection Time: 12/06/18 12:13 PM  Result Value Ref Range   Creatinine, Ser 0.80 0.44 - 1.00 mg/dL   Ct Angio Head W Or Wo Contrast  Result Date: 12/06/2018 CLINICAL DATA:  Follow-up TIA.  History of hypertension and stroke. EXAM: CT ANGIOGRAPHY HEAD AND NECK TECHNIQUE: Multidetector CT imaging of the head and neck was performed using the standard protocol during bolus administration of intravenous contrast. Multiplanar CT image reconstructions and MIPs were obtained to evaluate the vascular anatomy. Carotid stenosis measurements (when applicable) are obtained utilizing NASCET criteria, using the distal internal carotid diameter as the denominator. CONTRAST:  75mL ISOVUE-370 IOPAMIDOL (ISOVUE-370) INJECTION 76% COMPARISON:  CT HEAD December 06, 2018 at 1208 hours FINDINGS: CTA NECK FINDINGS: AORTIC ARCH: 2 vessel arch is a normal variant. Moderate calcific atherosclerosis aortic arch. The origins of the innominate, left Common carotid artery and subclavian artery are patent, mild luminal irregularity subclavian artery consistent with atherosclerosis. RIGHT CAROTID SYSTEM: Common carotid artery is patent, intimal thickening resulting in 50% stenosis proximal common carotid artery. Tandem severe stenosis RIGHT common carotid artery with disproportionate decreased contrast opacification. Severe calcific atherosclerosis RIGHT  carotid bifurcation with short segment critical stenosis. Patent cervical internal carotid artery with robust contrast opacification consistent with retrograde flow. LEFT CAROTID SYSTEM: Common carotid artery is patent, moderate calcific atherosclerosis without stenosis. Mild calcific atherosclerosis of the carotid bifurcation without hemodynamically significant stenosis by NASCET criteria. Normal appearance of the internal carotid artery. VERTEBRAL ARTERIES:RIGHT vertebral artery is dominant. Calcific atherosclerosis resulting in moderate stenosis bilateral  V1 segments. SKELETON: No acute osseous process though bone windows have not been submitted. Poor dentition. OTHER NECK: Soft tissues of the neck are nonacute though, not tailored for evaluation. UPPER CHEST: Included lung apices are clear. Mild centrilobular emphysema. No superior mediastinal lymphadenopathy. CTA HEAD FINDINGS: ANTERIOR CIRCULATION: Patent cervical internal carotid arteries, petrous, cavernous and supra clinoid internal carotid arteries, asymmetrically decreased RIGHT contrast opacification. Patent anterior communicating artery. Patent anterior and middle cerebral arteries. No large vessel occlusion, flow-limiting stenosis, contrast extravasation or aneurysm. POSTERIOR CIRCULATION: Patent vertebral arteries, vertebrobasilar junction and basilar artery, as well as main branch vessels. Patent posterior cerebral arteries. Severe stenosis bilateral P2 segments. No large vessel occlusion, flow-limiting stenosis, contrast extravasation or aneurysm. VENOUS SINUSES: Major dural venous sinuses are patent though not tailored for evaluation on this angiographic examination. ANATOMIC VARIANTS: None. DELAYED PHASE: Not performed. MIP images reviewed. IMPRESSION: CTA NECK: 1. Critical stenosis RIGHT ICA with tandem severe RIGHT CCA stenosis. 2. Patent bilateral vertebral arteries with moderate V1 stenosis. CTA HEAD: 1. No emergent large vessel occlusion.  Severe stenosis bilateral P2 segments. Aortic Atherosclerosis (ICD10-I70.0). Electronically Signed   By: Awilda Metro M.D.   On: 12/06/2018 12:59   Ct Angio Neck W Or Wo Contrast  Result Date: 12/06/2018 CLINICAL DATA:  Follow-up TIA.  History of hypertension and stroke. EXAM: CT ANGIOGRAPHY HEAD AND NECK TECHNIQUE: Multidetector CT imaging of the head and neck was performed using the standard protocol during bolus administration of intravenous contrast. Multiplanar CT image reconstructions and MIPs were obtained to evaluate the vascular anatomy. Carotid stenosis measurements (when applicable) are obtained utilizing NASCET criteria, using the distal internal carotid diameter as the denominator. CONTRAST:  75mL ISOVUE-370 IOPAMIDOL (ISOVUE-370) INJECTION 76% COMPARISON:  CT HEAD December 06, 2018 at 1208 hours FINDINGS: CTA NECK FINDINGS: AORTIC ARCH: 2 vessel arch is a normal variant. Moderate calcific atherosclerosis aortic arch. The origins of the innominate, left Common carotid artery and subclavian artery are patent, mild luminal irregularity subclavian artery consistent with atherosclerosis. RIGHT CAROTID SYSTEM: Common carotid artery is patent, intimal thickening resulting in 50% stenosis proximal common carotid artery. Tandem severe stenosis RIGHT common carotid artery with disproportionate decreased contrast opacification. Severe calcific atherosclerosis RIGHT carotid bifurcation with short segment critical stenosis. Patent cervical internal carotid artery with robust contrast opacification consistent with retrograde flow. LEFT CAROTID SYSTEM: Common carotid artery is patent, moderate calcific atherosclerosis without stenosis. Mild calcific atherosclerosis of the carotid bifurcation without hemodynamically significant stenosis by NASCET criteria. Normal appearance of the internal carotid artery. VERTEBRAL ARTERIES:RIGHT vertebral artery is dominant. Calcific atherosclerosis resulting in moderate  stenosis bilateral V1 segments. SKELETON: No acute osseous process though bone windows have not been submitted. Poor dentition. OTHER NECK: Soft tissues of the neck are nonacute though, not tailored for evaluation. UPPER CHEST: Included lung apices are clear. Mild centrilobular emphysema. No superior mediastinal lymphadenopathy. CTA HEAD FINDINGS: ANTERIOR CIRCULATION: Patent cervical internal carotid arteries, petrous, cavernous and supra clinoid internal carotid arteries, asymmetrically decreased RIGHT contrast opacification. Patent anterior communicating artery. Patent anterior and middle cerebral arteries. No large vessel occlusion, flow-limiting stenosis, contrast extravasation or aneurysm. POSTERIOR CIRCULATION: Patent vertebral arteries, vertebrobasilar junction and basilar artery, as well as main branch vessels. Patent posterior cerebral arteries. Severe stenosis bilateral P2 segments. No large vessel occlusion, flow-limiting stenosis, contrast extravasation or aneurysm. VENOUS SINUSES: Major dural venous sinuses are patent though not tailored for evaluation on this angiographic examination. ANATOMIC VARIANTS: None. DELAYED PHASE: Not performed. MIP images reviewed. IMPRESSION: CTA NECK: 1. Critical  stenosis RIGHT ICA with tandem severe RIGHT CCA stenosis. 2. Patent bilateral vertebral arteries with moderate V1 stenosis. CTA HEAD: 1. No emergent large vessel occlusion. Severe stenosis bilateral P2 segments. Aortic Atherosclerosis (ICD10-I70.0). Electronically Signed   By: Awilda Metro M.D.   On: 12/06/2018 12:59   Ct Head Code Stroke Wo Contrast  Result Date: 12/06/2018 CLINICAL DATA:  Code stroke. Difficulty speaking and making words. Last seen normal 2.5 Hours ago. EXAM: CT HEAD WITHOUT CONTRAST TECHNIQUE: Contiguous axial images were obtained from the base of the skull through the vertex without intravenous contrast. COMPARISON:  CT head without contrast 08/11/2014 FINDINGS: Brain: Focal area of  cortical infarct is present in the high right parietal lobe. Acute or focal cortical abnormality is present along the central sulcus on either side. No focal cortical abnormalities are present along the ganglionic or super ganglionic cortex. Remote lacunar infarcts are present in the thalami bilaterally and in the caudate head. Other smaller remote lacunar infarcts are present within the basal ganglia bilaterally, similar the prior study. White matter changes extend into the brainstem. The cerebellum is normal. The ventricles are of proportionate to the degree of atrophy. No significant extraaxial fluid collection is present. Vascular: Extensive atherosclerotic calcifications are present. There is no hyperdense vessel. Skull: Calvarium is intact. No focal lytic or blastic lesions are present. Sinuses/Orbits: The paranasal sinuses and mastoid air cells are clear. The globes and orbits are within normal limits. ASPECTS [facility] Stroke Program Early CT Score) - Ganglionic level infarction (caudate, lentiform nuclei, internal capsule, insula, M1-M3 cortex): 7/7 - Supraganglionic infarction (M4-M6 cortex): 3/3 Total score (0-10 with 10 being normal): 10/10 IMPRESSION: 1. High right parietal cortical nonhemorrhagic infarct, separate from the central sulcus. This is age indeterminate and may be acute. 2. No other acute cortical infarct. 3. Remote lacunar infarcts of the basal ganglia and thalami bilaterally. 4. Moderate generalized atrophy and white matter disease otherwise 5. ASPECTS is 10/10 The above was relayed via text pager to Dr. Amada Jupiter on 12/06/2018 at 12:16 . Electronically Signed   By: Marin Roberts M.D.   On: 12/06/2018 12:17    Pending Labs Unresulted Labs (From admission, onward)    Start     Ordered   12/07/18 0500  Hemoglobin A1c  Tomorrow morning,   R     12/06/18 1343   12/07/18 0500  Lipid panel  Tomorrow morning,   R    Comments:  Fasting    12/06/18 1343   12/07/18 0500  Ferritin   Tomorrow morning,   R     12/06/18 1518   12/07/18 0500  Iron and TIBC  Tomorrow morning,   R     12/06/18 1518   12/07/18 0500  Vitamin B12  Tomorrow morning,   R     12/06/18 1518   12/07/18 0500  Folate RBC  Tomorrow morning,   R     12/06/18 1518   12/07/18 0500  Phosphorus  Tomorrow morning,   R     12/06/18 1519   12/06/18 1519  Magnesium  Add-on,   R     12/06/18 1518   12/06/18 1339  HIV antibody (Routine Testing)  Once,   R     12/06/18 1343          Vitals/Pain Today's Vitals   12/06/18 1300 12/06/18 1315 12/06/18 1330 12/06/18 1345  BP: (!) 144/66   (!) 141/67  Pulse: 79 69 69 70  Resp: 16 14 18 16   Temp:  TempSrc:      SpO2: 100% 100% 100% 100%  Weight:      PainSc:        Isolation Precautions No active isolations  Medications Medications   stroke: mapping our early stages of recovery book (has no administration in time range)  0.9 %  sodium chloride infusion (has no administration in time range)  acetaminophen (TYLENOL) tablet 650 mg (has no administration in time range)    Or  acetaminophen (TYLENOL) solution 650 mg (has no administration in time range)    Or  acetaminophen (TYLENOL) suppository 650 mg (has no administration in time range)  senna-docusate (Senokot-S) tablet 1 tablet (has no administration in time range)  enoxaparin (LOVENOX) injection 40 mg (has no administration in time range)  folic acid (FOLVITE) tablet 1 mg (has no administration in time range)  thiamine (VITAMIN B-1) tablet 100 mg (has no administration in time range)  aspirin EC tablet 325 mg (has no administration in time range)    And  aspirin EC tablet 81 mg (has no administration in time range)  potassium chloride SA (K-DUR,KLOR-CON) CR tablet 40 mEq (has no administration in time range)  sodium chloride flush (NS) 0.9 % injection 3 mL (3 mLs Intravenous Given 12/06/18 1223)  iopamidol (ISOVUE-370) 76 % injection 75 mL (75 mLs Intravenous Contrast Given 12/06/18 1225)     Mobility walks     Focused Assessments Neuro Assessment Handoff:  Swallow screen pass? Yes    NIH Stroke Scale ( + Modified Stroke Scale Criteria)  Interval: Initial Level of Consciousness (1a.)   : Alert, keenly responsive LOC Questions (1b. )   +: Answers neither question correctly LOC Commands (1c. )   + : Performs both tasks correctly Best Gaze (2. )  +: Normal Visual (3. )  +: No visual loss Facial Palsy (4. )    : Normal symmetrical movements Motor Arm, Left (5a. )   +: No drift Motor Arm, Right (5b. )   +: No drift Motor Leg, Left (6a. )   +: No drift Motor Leg, Right (6b. )   +: No drift Limb Ataxia (7. ): Absent Sensory (8. )   +: Normal, no sensory loss Best Language (9. )   +: Mild-to-moderate aphasia Dysarthria (10. ): Normal Extinction/Inattention (11.)   +: No Abnormality Modified SS Total  +: 3 Complete NIHSS TOTAL: 3 Last date known well: 12/06/18 Last time known well: 0945 Neuro Assessment:   Neuro Checks:   Initial (12/06/18 1205)  Last Documented NIHSS Modified Score: 3 (12/06/18 1407) Has TPA been given? No If patient is a Neuro Trauma and patient is going to OR before floor call report to 4N Charge nurse: [phone] or [phone]     R Recommendations: See Admitting Provider Note  Report given to:   Additional Notes:

## 2018-12-06 NOTE — Evaluation (Signed)
Clinical/Bedside Swallow Evaluation Patient Details  Name: Lindsey Leon MRN: [id number] Date of Birth: [birth year]  Today's Date: 12/06/2018 Time: SLP Start Time (ACUTE ONLY): 1730 SLP Stop Time (ACUTE ONLY): 1745 SLP Time Calculation (min) (ACUTE ONLY): 15 min  Past Medical History:  Past Medical History:  Diagnosis Date  . Diverticulitis   . Hypertension    not on medications  . Pancreatitis   . Stroke [facility])    Past Surgical History:  Past Surgical History:  Procedure Laterality Date  . FLEXIBLE SIGMOIDOSCOPY N/A 02/26/2013   Procedure: FLEXIBLE SIGMOIDOSCOPY;  Surgeon: Theda Belfast, MD;  Location: [facility] ENDOSCOPY;  Service: Endoscopy;  Laterality: N/A;   HPI:  Pt is a 63 year old lady with past medical history of stroke (without major deficit), HTN, former smoker, former alcohol use, presented with right arm and hand weakness. CT of the head revealed high right parietal cortical nonhemorrhagic infarct, separate from the central sulcus and may be acute. Remote lacunar infarcts of the basal ganglia and thalami bilaterally.    Assessment / Plan / Recommendation Clinical Impression  Pt was seen for bedside swallow evaluation due to nurse's concern of the pt coughing with thin liquids. However, pt did pass the [facility] swallow screen prior to her transfer to the unit. She tolerated solids and 6/8 boluses of thin liquids without overt s/sx of aspiration but throat clearing was noted once thin liquids via tsp and coughing was demonstrated once with thin liquids via cup. Mastication time was mildly increased due to limited dentition. It is recommended that a dysphagia 2 diet with thin liquids be initiated at this time but SLP will follow to ensure tolerance and determine the need for instrumental assessment. Pt and nursing were educated regarding these recommendations and both parties verbalized understanding.  SLP Visit Diagnosis: Dysphagia, unspecified (R13.10)    Aspiration Risk        Diet Recommendation Dysphagia 2 (Fine chop);Thin liquid   Liquid Administration via: Cup;Straw(Individual sips) Medication Administration: Whole meds with liquid Supervision: Patient able to self feed Compensations: Slow rate;Small sips/bites Postural Changes: Seated upright at 90 degrees    Other  Recommendations Oral Care Recommendations: Oral care BID   Follow up Recommendations Other (comment)(Deferred until language assessment completed)      Frequency and Duration min 2x/week  2 weeks       Prognosis Prognosis for Safe Diet Advancement: Good      Swallow Study   General Date of Onset: 12/06/18 HPI: Pt is a 63 year old lady with past medical history of stroke (without major deficit), HTN, former smoker, former alcohol use, presented with right arm and hand weakness. CT of the head revealed high right parietal cortical nonhemorrhagic infarct, separate from the central sulcus and may be acute. Remote lacunar infarcts of the basal ganglia and thalami bilaterally.  Type of Study: Bedside Swallow Evaluation Previous Swallow Assessment: None Diet Prior to this Study: NPO Temperature Spikes Noted: No Respiratory Status: Room air History of Recent Intubation: No Behavior/Cognition: Alert;Cooperative;Pleasant mood Oral Cavity Assessment: Within Functional Limits Oral Care Completed by SLP: Recent completion by staff Oral Cavity - Dentition: Missing dentition;Poor condition(Pt presented with six mandibular teeth) Self-Feeding Abilities: Needs assist Patient Positioning: Upright in bed;Postural control adequate for testing Baseline Vocal Quality: Normal Volitional Cough: Strong Volitional Swallow: Able to elicit    Oral/Motor/Sensory Function Overall Oral Motor/Sensory Function: Within functional limits   Ice Chips Ice chips: Within functional limits Presentation: Spoon   Thin Liquid Thin Liquid: Impaired Presentation: Cup;Spoon;Straw Pharyngeal  Phase Impairments: Throat  Clearing - Immediate;Cough - Delayed(With 2/8 trials of thin liquids)    Nectar Thick Nectar Thick Liquid: Not tested   Honey Thick Honey Thick Liquid: Not tested   Puree Puree: Within functional limits Presentation: Spoon   Solid     Solid: Impaired Presentation: Spoon Oral Phase Impairments: Impaired mastication(Likely secondary to reduced dentition)     Lindsey Leon I. Vear Clock, MS, CCC-SLP Acute Rehabilitation Services Office number [phone] Pager [phone]   Lindsey Leon 12/06/2018,5:59 PM

## 2018-12-07 ENCOUNTER — Other Ambulatory Visit: Payer: Self-pay

## 2018-12-07 ENCOUNTER — Observation Stay (HOSPITAL_BASED_OUTPATIENT_CLINIC_OR_DEPARTMENT_OTHER): Payer: Medicaid Other

## 2018-12-07 DIAGNOSIS — R75 Inconclusive laboratory evidence of human immunodeficiency virus [HIV]: Secondary | ICD-10-CM

## 2018-12-07 DIAGNOSIS — G3184 Mild cognitive impairment, so stated: Secondary | ICD-10-CM

## 2018-12-07 DIAGNOSIS — I350 Nonrheumatic aortic (valve) stenosis: Secondary | ICD-10-CM

## 2018-12-07 DIAGNOSIS — E785 Hyperlipidemia, unspecified: Secondary | ICD-10-CM

## 2018-12-07 DIAGNOSIS — I10 Essential (primary) hypertension: Secondary | ICD-10-CM

## 2018-12-07 DIAGNOSIS — Z7982 Long term (current) use of aspirin: Secondary | ICD-10-CM

## 2018-12-07 DIAGNOSIS — Z7902 Long term (current) use of antithrombotics/antiplatelets: Secondary | ICD-10-CM

## 2018-12-07 DIAGNOSIS — I351 Nonrheumatic aortic (valve) insufficiency: Secondary | ICD-10-CM

## 2018-12-07 DIAGNOSIS — I6521 Occlusion and stenosis of right carotid artery: Secondary | ICD-10-CM

## 2018-12-07 DIAGNOSIS — D72819 Decreased white blood cell count, unspecified: Secondary | ICD-10-CM

## 2018-12-07 DIAGNOSIS — G459 Transient cerebral ischemic attack, unspecified: Principal | ICD-10-CM

## 2018-12-07 LAB — ECHOCARDIOGRAM COMPLETE
Height: 66 in
Weight: 1918.88 oz

## 2018-12-07 LAB — VITAMIN B12: Vitamin B-12: 586 pg/mL (ref 180–914)

## 2018-12-07 LAB — LIPID PANEL
Cholesterol: 100 mg/dL (ref 0–200)
HDL: 21 mg/dL — ABNORMAL LOW (ref >40)
LDL CALC: 26 mg/dL (ref 0–99)
Total CHOL/HDL Ratio: 4.8 RATIO
Triglycerides: 264 mg/dL — ABNORMAL HIGH (ref <150)
VLDL: 53 mg/dL — ABNORMAL HIGH (ref 0–40)

## 2018-12-07 LAB — HEMOGLOBIN A1C
Hgb A1c MFr Bld: 5.8 % — ABNORMAL HIGH (ref 4.8–5.6)
Mean Plasma Glucose: 119.76 mg/dL

## 2018-12-07 LAB — IRON AND TIBC
Iron: 100 ug/dL (ref 28–170)
Saturation Ratios: 29 % (ref 10.4–31.8)
TIBC: 344 ug/dL (ref 250–450)
UIBC: 244 ug/dL

## 2018-12-07 LAB — SAVE SMEAR(SSMR), FOR PROVIDER SLIDE REVIEW

## 2018-12-07 LAB — PHOSPHORUS: Phosphorus: 5.1 mg/dL — ABNORMAL HIGH (ref 2.5–4.6)

## 2018-12-07 LAB — FERRITIN: Ferritin: 1538 ng/mL — ABNORMAL HIGH (ref 11–307)

## 2018-12-07 MED ORDER — ASPIRIN 81 MG PO TBEC: 81.0000 mg | DELAYED_RELEASE_TABLET | Freq: Every day | ORAL | 0 refills | Status: DC

## 2018-12-07 MED ORDER — CLOPIDOGREL BISULFATE 75 MG PO TABS: 75.0000 mg | ORAL_TABLET | Freq: Every day | ORAL | 0 refills | Status: DC

## 2018-12-07 NOTE — Progress Notes (Signed)
The patient is being discharged home.  Her sister will be transporting. IV removed with catheter intact. Discharge instructions given to patient and her sister.

## 2018-12-07 NOTE — CV Procedure (Signed)
Echocardiogram not complete patient undergoing other exams.  Leta Jungling RDCS

## 2018-12-07 NOTE — Evaluation (Signed)
Physical Therapy Evaluation Patient Details Name: Lindsey Leon MRN: [id number] DOB: Jun 12, 1956 Today's Date: 12/07/2018   History of Present Illness  Pt is a 62 y/o female admitted secondary to R UE and hand weakness. MRI of the brain was negative for any acute findings. PMH including but not limited to HTN and prior stroke.    Clinical Impression  Pt presented supine in bed with HOB elevated, awake and willing to participate in therapy session. Prior to admission, pt reported that she was independent with all functional mobility and ADLs. Pt lives alone in a single level apartment with elevator access. Pt currently at modified independence to supervision level for functional mobility without use of an AD. Pt with mild instability with higher level balance tasks during ambulation, such as horizontal head turns and change in direction. PT will continue to follow acutely to progress mobility as tolerated and to ensure a safe d/c home.    Follow Up Recommendations No PT follow up    Equipment Recommendations  None recommended by PT    Recommendations for Other Services       Precautions / Restrictions Precautions Precautions: Fall Restrictions Weight Bearing Restrictions: No      Mobility  Bed Mobility Overal bed mobility: Modified Independent                Transfers Overall transfer level: Needs assistance Equipment used: None Transfers: Sit to/from Stand Sit to Stand: Supervision         General transfer comment: for safety  Ambulation/Gait Ambulation/Gait assistance: Supervision Gait Distance (Feet): 200 Feet Assistive device: None Gait Pattern/deviations: Step-through pattern;Decreased stride length;Drifts right/left Gait velocity: decreased   General Gait Details: pt with mild instability but no overt LOB or need for physical assistance, supervision for safety with pt intermittently drifting L/R with head turns  Stairs            Wheelchair  Mobility    Modified Rankin (Stroke Patients Only)       Balance Overall balance assessment: Needs assistance Sitting-balance support: Feet supported Sitting balance-Leahy Scale: Good     Standing balance support: No upper extremity supported Standing balance-Leahy Scale: Fair                               Pertinent Vitals/Pain Pain Assessment: No/denies pain    Home Living Family/patient expects to be discharged to:: Private residence Living Arrangements: Alone Available Help at Discharge: Family;Friend(s);Available PRN/intermittently Type of Home: Apartment Home Access: Elevator     Home Layout: One level Home Equipment: None      Prior Function Level of Independence: Independent         Comments: takes the bus     Hand Dominance   Dominant Hand: Right    Extremity/Trunk Assessment   Upper Extremity Assessment Upper Extremity Assessment: Overall WFL for tasks assessed;Defer to OT evaluation    Lower Extremity Assessment Lower Extremity Assessment: Overall WFL for tasks assessed       Communication   Communication: No difficulties  Cognition Arousal/Alertness: Awake/alert Behavior During Therapy: WFL for tasks assessed/performed Overall Cognitive Status: Within Functional Limits for tasks assessed                                 General Comments: cognition not formally assessed but [facility] for general conversation      General Comments  Exercises     Assessment/Plan    PT Assessment Patient needs continued PT services  PT Problem List Decreased balance;Decreased mobility;Decreased coordination       PT Treatment Interventions DME instruction;Gait training;Stair training;Functional mobility training;Therapeutic activities;Therapeutic exercise;Balance training;Neuromuscular re-education;Patient/family education    PT Goals (Current goals can be found in the Care Plan section)  Acute Rehab PT Goals Patient  Stated Goal: to sleep PT Goal Formulation: With patient Time For Goal Achievement: 12/21/18 Potential to Achieve Goals: Good    Frequency Min 3X/week   Barriers to discharge        Co-evaluation               AM-PAC PT "6 Clicks" Mobility  Outcome Measure Help needed turning from your back to your side while in a flat bed without using bedrails?: None Help needed moving from lying on your back to sitting on the side of a flat bed without using bedrails?: None Help needed moving to and from a bed to a chair (including a wheelchair)?: None Help needed standing up from a chair using your arms (e.g., wheelchair or bedside chair)?: None Help needed to walk in hospital room?: None Help needed climbing 3-5 steps with a railing? : A Little 6 Click Score: 23    End of Session   Activity Tolerance: Patient tolerated treatment well Patient left: in bed;with call bell/phone within reach;Other (comment)(sitting EOB) Nurse Communication: Mobility status PT Visit Diagnosis: Other abnormalities of gait and mobility (R26.89)    Time: 9735-3299 PT Time Calculation (min) (ACUTE ONLY): 15 min   Charges:   PT Evaluation $PT Eval Moderate Complexity: 1 Mod          Deborah Chalk, PT, DPT  Acute Rehabilitation Services Pager [phone] Office [phone]    [name] 12/07/2018, 11:58 AM

## 2018-12-07 NOTE — Discharge Summary (Addendum)
Name: Lindsey Leon MRN: [id number] DOB: 1956-03-07 63 y.o. PCP: Default, Provider, MD  Date of Admission: 12/06/2018 12:04 PM Date of Discharge: 12/07/2018 Attending Physician: Doneen Poisson  Discharge Diagnosis: TIA Cognitive impairment Leukopenia Positive HIV screening test Aortic Stenosis  Discharge Medications: Allergies as of 12/07/2018   No Known Allergies     Medication List    STOP taking these medications   amLODipine 5 MG tablet Commonly known as:  NORVASC   hydrochlorothiazide 12.5 MG capsule Commonly known as:  MICROZIDE   ibuprofen 600 MG tablet Commonly known as:  ADVIL,MOTRIN   pravastatin 20 MG tablet Commonly known as:  PRAVACHOL     TAKE these medications   aspirin 81 MG EC tablet Take 1 tablet (81 mg total) by mouth daily.   clopidogrel 75 MG tablet Commonly known as:  PLAVIX Take 1 tablet (75 mg total) by mouth daily.       Disposition and follow-up:   Lindsey Leon was discharged from [facility] in Good condition.  At the hospital follow up visit please address:  TIA: symptoms and imaging consistent with TIA.  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     -ensure pt taking her antiplatelet regimen appropriately -ensure pt follows with vascular surgery for possible CEA -neurology recommended no statin due to LDL being within goal however with significant plaque, high TG's and low HDL would consider low dose.  Some data suggests plaque regression with continued use.    Positive HIV screening: pt called and informed of result, she will make an appointment with ID clinic.  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  Labs / imaging needed at time of follow-up: cbc  3.  Pending labs/ test needing follow-up: none  Follow-up Appointments: Follow-up Information    [facility]. Schedule an appointment as soon as possible for a visit in 4 week(s).   Specialty:  Neurology Contact information: [address] [place] [address] [phone]       [facility]. Schedule an appointment as soon as possible for a visit in 4 week(s).   Specialty:  Vascular Surgery Contact information: [address] [place] [address] [phone]       Chevis Pretty, MD Follow up in 1 week(s).   Specialty:  Internal Medicine Why:  Please follow up in our clinic in about one week Contact information: [address] [place] [address] [phone]           Hospital Course by problem list: TIA Cognitive impairment Leukopenia Positive HIV screening test Aortic Stenosis  Patient presented with signs and symptoms concerning for transient ischemic attack versus new CVA.  Specifically she had transient weakness and discoordination of her right arm lasting a few hours.  Her work-up was negative for a new CVA and ultimately it was felt that she had a TIA.  She was optimized medically and started on antiplatelet therapy according to her neurologist recommendations.  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  She was also found to have moderate to severe aortic stenosis, leukopenia and a positive HIV screening test.  She was given instructions to arrange outpatient follow-up.  She will follow-up in 1 week in our clinic and  we will help ensure her specialist follow-up visits.    Discharge Vitals:   BP (!) 144/64 (BP Location: Left Arm)   Pulse 74   Temp 98.4 F (36.9 C) (Oral)   Resp 18   Ht 5\' 6"  (1.676 m)   Wt 54.4 kg   SpO2 100%   BMI 19.36 kg/m   Pertinent Labs, Studies, and Procedures:  CBC Latest Ref Rng & Units 12/06/2018 09/20/2018 08/11/2014  WBC 4.0 - 10.5 K/uL 2.6(L) - 6.2  Hemoglobin 12.0 - 15.0 g/dL 11.6(L) 11.2(L) 13.2  Hematocrit 36.0 - 46.0 % 34.6(L) 33.0(L) 36.4  Platelets 150 - 400 K/uL 165 - 197   BMP Latest Ref Rng & Units 12/06/2018 12/06/2018 09/20/2018  Glucose 70 - 99 mg/dL - 419(F) 790(W)  BUN 8 - 23 mg/dL - 12 10  Creatinine 4.09 - 1.00 mg/dL 7.35 3.29 9.24  Sodium 135 - 145 mmol/L - 138 138  Potassium 3.5 - 5.1 mmol/L - 3.2(L) 3.4(L)  Chloride 98 - 111 mmol/L - 106 100  CO2 22 - 32 mmol/L - 20(L) -  Calcium 8.9 - 10.3 mg/dL - 9.7 -   HIV Screen 4th Generation wRfx Non Reactive ReactiveAbnormal     Ref Range & Units 3d ago  HIV 1 Ab Negative PositiveAbnormal    Comment:         Client Requested Flag  HIV 2 Ab Negative Negative   Note  SEE COMMENT    CLINICAL DATA:  Follow-up TIA.  History of hypertension and stroke.  EXAM: MRI HEAD WITHOUT CONTRAST  TECHNIQUE: Multiplanar, multiecho pulse sequences of the brain and surrounding structures were obtained without intravenous contrast.  COMPARISON:  CT HEAD December 06, 2018 and MRI head May 24, 2014  FINDINGS: Sequences vary from moderate to severely motion degraded.  INTRACRANIAL CONTENTS: No reduced diffusion to suggest acute ischemia. LEFT temporal susceptibility artifact and encephalomalacia at site of prior hemorrhage. Moderate to severe parenchymal brain volume loss, advanced for age. Old bilateral thalami, bilateral basal ganglia and RIGHT pontine small infarcts. Confluent supratentorial white matter FLAIR T2 hyperintensities.  VASCULAR: Slow flow RIGHT ICA.  SKULL AND UPPER  CERVICAL SPINE: No abnormal sellar expansion. No suspicious calvarial bone marrow signal though limited by motion.  SINUSES/ORBITS: The mastoid air-cells and included paranasal sinuses are well-aerated.The included ocular globes and orbital contents are non-suspicious.  OTHER: None.  IMPRESSION: 1. No acute intracranial process on this motion degraded examination. 2. Old basal ganglia, thalami and pontine infarcts. Old LEFT temporal lobe hemorrhagic infarct. 3. Severe chronic small vessel ischemic changes though can be seen with immunocompromised states. 4. Moderate to severe parenchymal brain volume loss, advanced for age.   Electronically Signed   By: Awilda Metro M.D.   On: 12/06/2018 17:48 CLINICAL DATA:  Follow-up TIA.  History of hypertension and stroke.  EXAM: CT ANGIOGRAPHY HEAD AND NECK  TECHNIQUE: Multidetector CT imaging of the head and neck was performed using the standard protocol during bolus administration of intravenous contrast. Multiplanar CT image reconstructions and MIPs were obtained to evaluate the vascular anatomy. Carotid stenosis measurements (when applicable) are obtained utilizing NASCET criteria, using the distal internal carotid diameter as the denominator.  CONTRAST:  75mL ISOVUE-370 IOPAMIDOL (ISOVUE-370) INJECTION 76%  COMPARISON:  CT HEAD December 06, 2018 at 1208 hours  FINDINGS: CTA NECK FINDINGS:  AORTIC ARCH: 2 vessel arch is a normal variant. Moderate calcific atherosclerosis aortic arch. The origins of the innominate, left Common carotid artery and subclavian artery are patent, mild luminal irregularity subclavian artery consistent with atherosclerosis.  RIGHT CAROTID SYSTEM: Common carotid artery is patent, intimal thickening resulting in 50% stenosis proximal common carotid artery. Tandem severe stenosis RIGHT common carotid artery with disproportionate decreased contrast opacification. Severe  calcific atherosclerosis RIGHT carotid bifurcation with short segment critical stenosis. Patent cervical internal carotid artery with robust contrast opacification consistent with retrograde flow.  LEFT CAROTID SYSTEM: Common carotid artery is patent, moderate calcific atherosclerosis without stenosis. Mild calcific atherosclerosis of the carotid bifurcation without hemodynamically significant stenosis by NASCET criteria. Normal appearance of the internal carotid artery.  VERTEBRAL ARTERIES:RIGHT vertebral artery is dominant. Calcific atherosclerosis resulting in moderate stenosis bilateral V1 segments.  SKELETON: No acute osseous process though bone windows have not been submitted. Poor dentition.  OTHER NECK: Soft tissues of the neck are nonacute though, not tailored for evaluation.  UPPER CHEST: Included lung apices are clear. Mild centrilobular emphysema. No superior mediastinal lymphadenopathy.  CTA HEAD FINDINGS:  ANTERIOR CIRCULATION: Patent cervical internal carotid arteries, petrous, cavernous and supra clinoid internal carotid arteries, asymmetrically decreased RIGHT contrast opacification. Patent anterior communicating artery. Patent anterior and middle cerebral arteries.  No large vessel occlusion, flow-limiting stenosis, contrast extravasation or aneurysm.  POSTERIOR CIRCULATION: Patent vertebral arteries, vertebrobasilar junction and basilar artery, as well as main branch vessels. Patent posterior cerebral arteries. Severe stenosis bilateral P2 segments.  No large vessel occlusion, flow-limiting stenosis, contrast extravasation or aneurysm.  VENOUS SINUSES: Major dural venous sinuses are patent though not tailored for evaluation on this angiographic examination.  ANATOMIC VARIANTS: None.  DELAYED PHASE: Not performed.  MIP images reviewed.  IMPRESSION: CTA NECK:  1. Critical stenosis RIGHT ICA with tandem severe RIGHT  CCA stenosis. 2. Patent bilateral vertebral arteries with moderate V1 stenosis.  CTA HEAD:  1. No emergent large vessel occlusion. Severe stenosis bilateral P2 segments.  Aortic Atherosclerosis (ICD10-I70.0).   Electronically Signed   By: Awilda Metro M.D.   On: 12/06/2018 12:59  Result status: Final result    ECHOCARDIOGRAM REPORT       Patient Name:   Lindsey Leon Date of Exam: 12/07/2018 Medical Rec #:  [id number]        Height:       66.0 in Accession #:    [id number]       Weight:       119.9 lb Date of Birth:  05/01/1956        BSA:          1.61 m Patient Age:    62 years         BP:           118/60 mmHg Patient Gender: F                HR:           80 bpm. Exam Location:  Inpatient    Procedure: 2D Echo  Indications:    TIA 435.9 / G45.9   History:        Patient has prior history of Echocardiogram examinations, most                 recent 05/24/2014. Stroke; Risk Factors: Hypertension.   Sonographer:  Leta Jungling RDCS Referring Phys: [id number]    Sonographer Comments: Patient unable to lay still during the procedure, PLAX and SAX are very limited. IMPRESSIONS    1. The left ventricle has normal systolic function with an ejection fraction of 60-65%. The cavity size was normal. There is mild asymmetric left ventricular hypertrophy of the anteroseptal wall. Left ventricular diastolic Doppler parameters are  consistent with impaired relaxation.  2. The right ventricle has normal systolic function. The cavity was normal. There is no increase in right ventricular wall thickness.  3. The mitral valve is normal in structure.  4. The tricuspid valve is normal in structure.  5. The aortic valve is tricuspid Moderate thickening of the aortic valve Severe calcifcation of the aortic valve. Aortic valve regurgitation is moderate by color flow Doppler. moderate-severe stenosis of the aortic valve (3.65m/s, mean gradient ).  6. The  pulmonic valve was normal in structure.  FINDINGS  Left Ventricle: The left ventricle has normal systolic function, with an ejection fraction of 60-65%. The cavity size was normal. There is mild asymmetric left ventricular hypertrophy of the anteroseptal wall. Left ventricular diastolic Doppler  parameters are consistent with impaired relaxation Right Ventricle: The right ventricle has normal systolic function. The cavity was normal. There is no increase in right ventricular wall thickness. Left Atrium: left atrial size was normal in size Right Atrium: right atrial size was normal in size. Interatrial Septum: No atrial level shunt detected by color flow Doppler. Pericardium: There is no evidence of pericardial effusion. Mitral Valve: The mitral valve is normal in structure. Mitral valve regurgitation is mild by color flow Doppler. Tricuspid Valve: The tricuspid valve is normal in structure. Tricuspid valve regurgitation was not visualized by color flow Doppler. Aortic Valve: The aortic valve is tricuspid Moderate thickening of the aortic valve Severe calcifcation of the aortic valve, with moderately decreased cusp excursion. Aortic valve regurgitation is moderate by color flow Doppler. There is moderate  stenosis of the aortic valve. Pulmonic Valve: The pulmonic valve was normal in structure. Pulmonic valve regurgitation is not visualized by color flow Doppler. Venous: The inferior vena cava is normal in size with greater than 50% respiratory variability.   LEFT VENTRICLE PLAX 2D (Teich) LV EF:          44.2 %  Diastology LVIDd:          3.30 cm LV e' lateral:   6.64 cm/s LVIDs:          2.60 cm LV E/e' lateral: 15.2 LV PW:          1.00 cm LV e' medial:    5.98 cm/s LV IVS:         1.50 cm LV E/e' medial:  16.9 LV SV:          20 ml  RIGHT VENTRICLE RV S prime:     11.00 cm/s TAPSE (M-mode): 1.7 cm  LEFT ATRIUM             Index       RIGHT ATRIUM          Index LA diam:         3.40 cm 2.11 cm/m  RA Area:     8.90 cm LA Vol (A2C):   35.8 ml 22.25 ml/m RA Volume:   17.80 ml 11.06 ml/m LA Vol (A4C):   40.3 ml 25.04 ml/m LA Biplane Vol: 38.1 ml 23.68 ml/m  AORTIC VALVE AV Vmax:  351.67 cm/s AV Vmean:     227.000 cm/s AV VTI:       0.683 m AV Peak Grad: 49.5 mmHg AV Mean Grad: 24.0 mmHg AR PHT:       478 msec   AORTA Ao Root diam: 2.30 cm  MITRAL VALVE MV Area (PHT): 4.39 cm MV PHT:        50.17 msec MV Decel Time: 173 msec MV E velocity: 101.00 cm/s MV A velocity: 101.00 cm/s MV E/A ratio:  1.00    Donato SchultzMark Skains MD Electronically signed by Donato SchultzMark Skains MD Signature Date/Time: 12/07/2018/2:30:42 PM       Final       Discharge Instructions: Discharge Instructions    Ambulatory referral to Neurology   Complete by:  As directed    Follow up with stroke clinic NP (Jessica Vanschaick or Darrol Angelarolyn Martin, if both not available, consider Manson AllanSethi, Penumali, or Ahern) at [facility] in about 4 weeks. Thanks.   Ambulatory referral to Vascular Surgery   Complete by:  As directed    Right ICA and CCA high-grade stenosis   Diet - low sodium heart healthy   Complete by:  As directed    Discharge instructions   Complete by:  As directed    Lindsey Leon you will need to be on a daily aspirin indefinitely.  We will also start another blood thinner called plavix (clopidogrel) that you will take for three weeks.  You will need to follow up with a vascular surgeon to address some plaque that was found in you carotid artery.  You will also need to follow up with neurology in about one month.  I would like you to follow up in the internal medicine clinic in about one week to help facilitate all this and make sure you're doing okay.  On the ultrasound of your heart we found that your aortic valve was not opening as well as it should.  You will need follow up ultrasounds of your heart at intervals of every 6 months to 1 year.   Increase activity slowly   Complete by:  As  directed       Signed: Angelita InglesWinfrey, Chauntelle Azpeitia B, MD 12/08/2018, 11:38 AM

## 2018-12-07 NOTE — Progress Notes (Addendum)
   Subjective: Patient feels well today, no recurrence of arm weakness/discoordination.  She denies headache, nausea or vomiting.    Objective:  Vital signs in last 24 hours: Vitals:   12/07/18 0022 12/07/18 0400 12/07/18 0700 12/07/18 0737  BP: (!) 117/56 125/75  115/65  Pulse: 79 82  77  Resp: 18 18  18   Temp: 98.3 F (36.8 C) 98.2 F (36.8 C)  98.6 F (37 C)  TempSrc: Oral Oral  Oral  SpO2: 98% 100%  100%  Weight:      Height:   5\' 6"  (1.676 m)    Cardiac: normal rate and rhythm, clear s1 and s2, 2/6 AS murmur, no rubs or gallops Pulmonary: CTAB, not in distress Extremities: no LE edema Psych: Alert, conversant, in good spirits Neuro: II:  Visual fields intact   III,IV, VI: Ptosis not present, EOMI without nystagmus V,VII: No facial droop. Facial temp sensation equal bilaterally VIII: hearing intact to voice IX,X: Palate rises symmetrically XI: Symmetric shoulder shrug XII: midline tongue extension Motor: RUE and RLE 5/5 LUE 5/5 LLE 5/5 Sensory: Temp and light touch intact x 4 Deep Tendon Reflexes:   3-4+  without asymmetry Cerebellar: No FNF dysmettria Romberg: neg    Assessment/Plan:  Active Problems:   TIA (transient ischemic attack)  TIA: symptoms and imaging consistent with TIA.  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    -review smear with hematologist this week  -pcp follow up   Dispo: Anticipated  discharge in approximately 0-1 day(s).   Angelita Ingles, MD 12/07/2018, 10:30 AM

## 2018-12-07 NOTE — Progress Notes (Signed)
  Speech Language Pathology Treatment: Dysphagia  Patient Details Name: Lindsey Leon MRN: [id number] DOB: 1955-11-07 Today's Date: 12/07/2018 Time: 6122-4497 SLP Time Calculation (min) (ACUTE ONLY): 25 min  Assessment / Plan / Recommendation Clinical Impression  Pt demonstrates excellent tolerance of thin liquids, no further signs of aspiration observed with thin liquids. Pt able to consume mech soft solids, Will advance fo Dys 3/thin and sign off for swallowing.   HPI HPI: Pt is a 63 year old lady with past medical history of stroke (without major deficit), HTN, former smoker, former alcohol use, presented with right arm and hand weakness. CT of the head revealed high right parietal cortical nonhemorrhagic infarct, separate from the central sulcus and may be acute. Remote lacunar infarcts of the basal ganglia and thalami bilaterally.       SLP Plan  All goals met  Patient needs continued Speech Lanaguage Pathology Services    Recommendations  Diet recommendations: Thin liquid;Dysphagia 3 (mechanical soft) Medication Administration: Whole meds with liquid Supervision: Patient able to self feed Compensations: Slow rate;Small sips/bites Postural Changes and/or Swallow Maneuvers: Seated upright 90 degrees                Follow up Recommendations: 24 hour supervision/assistance;Other (comment) SLP Visit Diagnosis: Frontal lobe and executive function deficit Frontal lobe and executive function deficit following: Other cerebrovascular disease Plan: All goals met       GO               Herbie Baltimore, MA CCC-SLP  Acute Rehabilitation Services Pager [phone] Office [phone]  Lynann Beaver 12/07/2018, 2:07 PM

## 2018-12-07 NOTE — Progress Notes (Signed)
CSW received a phone call from the nurse about the patient. Patient's sister is concerned that her sister is living alone and is starting to become forgetful. Patient is receiving an SSI check every month but does not have insurance.   Patient's sister stated that there have been a few times where she has misplaced her phone or misplaced her car keys. She assists her sister with picking up her groceries or running to the bank. Patient's sister stated that her apartment feeds the residents lunch and that she is fearful that she is only eating once per day.   CSW asked if the patient had any other family. The patient has a son. Son's name is Dominga Lammons [phone]). They are unsure of what they want to do at this time.   CSW advised the patient's sister to contact financial counseling on Monday to start her sister's medicaid application. CSW also provided patient's sister with information on a place for mom.   Neurology will be meeting with the patient on Monday. Family is unsure of what they want to do at this time.   CSW will continue to follow.   Drucilla Schmidt, MSW, LCSW-A Clinical Social Worker [employer]

## 2018-12-07 NOTE — Progress Notes (Addendum)
Occupational Therapy Evaluation Patient Details Name: Lindsey Leon MRN: [id number] DOB: 1956-02-03 Today's Date: 12/07/2018    History of Present Illness Pt is a 63 y/o female admitted secondary to R UE and hand weakness. MRI of the brain was negative for any acute findings. PMH including but not limited to HTN and prior stroke.   Clinical Impression   PTA, pt lived alone and completed ADL and mobility at independent level. Per chart, pt's sister assists with driving. Pt presents with significant cognitive deficits affecting her ability to safely complete IADL tasks. Pt unable to recall the name of her apartment, her address, any of her medications (stated I takes 2, then 5), her medication schedule, unable to recall what she had for breakfast and had difficulty returning to her room after a walk on the unit. Pt states it is "October" and after being told it is February, pt unable to recall correct month after 2 min delay.  Pt needs direct S with medication management and IADL tasks, i.e. cooking. Pt states she "takes the bus", which is concerning for pt becoming lost given functional cognitive deficits. Pt would benefit from living in an ALF. Recommend HHOT and HHSW if available. Will follow acutely to facilitate safe DC home.    Follow Up Recommendations  Supervision - Intermittent;Home health OT    Equipment Recommendations  None recommended by OT    Recommendations for Other Services       Precautions / Restrictions Precautions Precautions: Fall Restrictions Weight Bearing Restrictions: No      Mobility Bed Mobility Overal bed mobility: Modified Independent                Transfers Overall transfer level: Needs assistance Equipment used: None Transfers: Sit to/from Stand Sit to Stand: Supervision         General transfer comment: for safety    Balance Overall balance assessment: Needs assistance Sitting-balance support: Feet supported Sitting balance-Leahy  Scale: Good     Standing balance support: No upper extremity supported Standing balance-Leahy Scale: Fair                             ADL either performed or assessed with clinical judgement   ADL Overall ADL's : Needs assistance/impaired                                     Functional mobility during ADLs: Supervision/safety General ADL Comments: Able to complete basic ADL tasks, including bathig/dressing, but will need assistance with IADL tasks     Vision         Perception     Praxis      Pertinent Vitals/Pain Pain Assessment: No/denies pain     Hand Dominance Right   Extremity/Trunk Assessment Upper Extremity Assessment Upper Extremity Assessment: RUE deficits/detail RUE Deficits / Details: mild deficits from previous CVA but functional            Communication Communication Communication: No difficulties   Cognition Arousal/Alertness: Awake/alert Behavior During Therapy: WFL for tasks assessed/performed Overall Cognitive Status: Impaired/Different from baseline Area of Impairment: Orientation;Attention;Memory;Following commands;Safety/judgement;Awareness;Problem solving                 Orientation Level: Disoriented to;Time;Situation("October") Current Attention Level: Selective Memory: Decreased short-term memory Following Commands: Follows one step commands consistently Safety/Judgement: Decreased awareness of safety;Decreased awareness of deficits Awareness: Emergent  Problem Solving: Slow processing General Comments: Pt unable to stae her address; unable to recall the name of her apt; pt unable to state the month; unable to recall month after 1 min delay; unable to state any of her medications or explain her medication schedule   General Comments       Exercises     Shoulder Instructions      Home Living Family/patient expects to be discharged to:: Private residence Living Arrangements: Alone Available Help  at Discharge: Family;Friend(s);Available PRN/intermittently Type of Home: Apartment Home Access: Elevator     Home Layout: One level     Bathroom Shower/Tub: Chief Strategy Officer: Standard Bathroom Accessibility: Yes How Accessible: Accessible via walker Home Equipment: None      Lives With: Alone    Prior Functioning/Environment Level of Independence: Independent        Comments: takes the bus; sister helps with transportation; does her own medication schedule; makes her meals as needed        OT Problem List: Decreased cognition;Decreased safety awareness      OT Treatment/Interventions: Self-care/ADL training;Cognitive remediation/compensation;Patient/family education    OT Goals(Current goals can be found in the care plan section) Acute Rehab OT Goals Patient Stated Goal: none stated OT Goal Formulation: Patient unable to participate in goal setting Time For Goal Achievement: 12/21/18 Potential to Achieve Goals: Fair  OT Frequency: Min 2X/week   Barriers to D/C:            Co-evaluation              AM-PAC OT "6 Clicks" Daily Activity     Outcome Measure Help from another person eating meals?: None Help from another person taking care of personal grooming?: None Help from another person toileting, which includes using toliet, bedpan, or urinal?: None Help from another person bathing (including washing, rinsing, drying)?: None Help from another person to put on and taking off regular upper body clothing?: None Help from another person to put on and taking off regular lower body clothing?: None 6 Click Score: 24   End of Session Nurse Communication: Mobility status  Activity Tolerance: Patient tolerated treatment well Patient left: in bed;with call bell/phone within reach;with bed alarm set  OT Visit Diagnosis: Other symptoms and signs involving cognitive function                Time: 2694-8546 OT Time Calculation (min): 15  min Charges:  OT General Charges $OT Visit: 1 Visit OT Evaluation $OT Eval Moderate Complexity: 1 Mod  Luis Nickles, OT/L   Acute OT Clinical Specialist Acute Rehabilitation Services Pager [phone] Office [phone]   [facility] 12/07/2018, 4:31 PM

## 2018-12-07 NOTE — Progress Notes (Signed)
STROKE TEAM PROGRESS NOTE   SUBJECTIVE (INTERVAL HISTORY) No family is at the bedside.  Patient is sitting in bed, not in distress.  She stated that she felt right hand numbness for 30 minutes yesterday.  Denies speaking difficulty denies right hand weakness.    OBJECTIVE Vitals:   12/07/18 0400 12/07/18 0700 12/07/18 0737 12/07/18 1200  BP: 125/75  115/65 118/60  Pulse: 82  77 80  Resp: 18  18 18   Temp: 98.2 F (36.8 C)  98.6 F (37 C)   TempSrc: Oral  Oral   SpO2: 100%  100% 100%  Weight:      Height:  5\' 6"  (1.676 m)      CBC:  Recent Labs  Lab 12/06/18 1205  WBC 2.6*  NEUTROABS 1.6*  HGB 11.6*  HCT 34.6*  MCV 94.3  PLT 165    Basic Metabolic Panel:  Recent Labs  Lab 12/06/18 1205 12/06/18 1213 12/06/18 1519 12/07/18 0501  NA 138  --   --   --   K 3.2*  --   --   --   CL 106  --   --   --   CO2 20*  --   --   --   GLUCOSE 125*  --   --   --   BUN 12  --   --   --   CREATININE 0.87 0.80  --   --   CALCIUM 9.7  --   --   --   MG  --   --  1.9  --   PHOS  --   --   --  5.1*    Lipid Panel:     Component Value Date/Time   CHOL 100 12/07/2018 0501   TRIG 264 (H) 12/07/2018 0501   HDL 21 (L) 12/07/2018 0501   CHOLHDL 4.8 12/07/2018 0501   VLDL 53 (H) 12/07/2018 0501   LDLCALC 26 12/07/2018 0501   HgbA1c:  Lab Results  Component Value Date   HGBA1C 5.8 (H) 12/07/2018   Urine Drug Screen:     Component Value Date/Time   LABOPIA NONE DETECTED 12/06/2018 2220   COCAINSCRNUR NONE DETECTED 12/06/2018 2220   LABBENZ NONE DETECTED 12/06/2018 2220   AMPHETMU NONE DETECTED 12/06/2018 2220   THCU NONE DETECTED 12/06/2018 2220   LABBARB NONE DETECTED 12/06/2018 2220    Alcohol Level No results found for: ETH  IMAGING  Ct Angio Head W Or Wo Contrast Ct Angio Neck W Or Wo Contrast 12/06/2018 IMPRESSION:   CTA NECK:  1. Critical stenosis RIGHT ICA with tandem severe RIGHT CCA stenosis.  2. Patent bilateral vertebral arteries with moderate V1  stenosis.   CTA HEAD:  1. No emergent large vessel occlusion. Severe stenosis bilateral P2 segments.  Aortic Atherosclerosis (ICD10-I70.0).   Mr Brain Wo Contrast 12/06/2018 IMPRESSION:  1. No acute intracranial process on this motion degraded examination.  2. Old basal ganglia, thalami and pontine infarcts. Old LEFT temporal lobe hemorrhagic infarct.  3. Severe chronic small vessel ischemic changes though can be seen with immunocompromised states.  4. Moderate to severe parenchymal brain volume loss, advanced for age.    Ct Head Code Stroke Wo Contrast 12/06/2018 IMPRESSION:  1. High right parietal cortical nonhemorrhagic infarct, separate from the central sulcus. This is age indeterminate and may be acute.  2. No other acute cortical infarct.  3. Remote lacunar infarcts of the basal ganglia and thalami bilaterally.  4. Moderate generalized atrophy and white matter  disease otherwise  5. ASPECTS is 10/10  Transthoracic Echocardiogram  00/00/2020 Pending   PHYSICAL EXAM  Temp:  [97.4 F (36.3 C)-98.6 F (37 C)] 98.6 F (37 C) (02/22 0737) Pulse Rate:  [77-82] 80 (02/22 1200) Resp:  [18-20] 18 (02/22 1200) BP: (115-181)/(56-76) 118/60 (02/22 1200) SpO2:  [98 %-100 %] 100 % (02/22 1200)  General - Well nourished, well developed, in no apparent distress.  Ophthalmologic - fundi not visualized due to noncooperation.  Cardiovascular - Regular rate and rhythm.  Mental Status -  Level of arousal and orientation to year, place, and person were intact, however not orientated to months. Language including expression, naming, repetition, comprehension was assessed and found intact. Fund of Knowledge was assessed and was impaired  Cranial Nerves II - XII - II - Visual field intact OU. III, IV, VI - Extraocular movements intact. V - Facial sensation intact bilaterally. VII - Facial movement intact bilaterally. VIII - Hearing & vestibular intact bilaterally. X - Palate  elevates symmetrically. XI - Chin turning & shoulder shrug intact bilaterally. XII - Tongue protrusion intact.  Motor Strength - The patient's strength was normal in all extremities and pronator drift was absent.  Bulk was normal and fasciculations were absent.   Motor Tone - Muscle tone was assessed at the neck and appendages and was normal.  Reflexes - The patient's reflexes were symmetrical in all extremities and she had no pathological reflexes.  Sensory - Light touch, temperature/pinprick were assessed and were symmetrical.    Coordination - The patient had normal movements in the hands with no ataxia or dysmetria.  Tremor was absent.  Gait and Station - deferred.     ASSESSMENT/PLAN Lindsey Leon is a 63 y.o. female with history of Htn, former smoker, and previous ICH presenting with transient speech difficulties. She did not receive IV t-PA due to resolution of deficits and hx of previous ICH.  Possible left brain TIA    Resultant back to baseline  CT head - no acute abnormality  MRI head - No acute intracranial process. Remote lacunar infarcts of the basal ganglia and thalami bilaterally. Severe chronic small vessel ischemic.  CTA H&N - Critical stenosis RIGHT ICA with tandem severe RIGHT CCA stenosis. Severe stenosis bilateral P2 segments.  Bilateral VA origin stenosis  2D Echo - pending  LDL - 26  HgbA1c - 5.8  UDS - negative  VTE prophylaxis - Lovenox  Diet - dysphagia 2 diet with thin liquids  No antithrombotic prior to admission, now on aspirin 81 mg daily and clopidogrel 75 mg daily.  Continue DAPT for 3 weeks and then aspirin 81 alone.  Patient counseled to be compliant with her antithrombotic medications  Ongoing aggressive stroke risk factor management  Therapy recommendations:  No PT f/u recommended  Disposition:  Pending  Right CCA and ICA tandem high-grade stenosis  05/2014 carotid Doppler showed right ICA 60 to 79% stenosis  This  admission, right CCA and ICA tandem critical stenosis confirmed on CTA  Seems to be asymptomatic at this time  Recommend outpatient referral to vascular surgery for monitoring  History of ICH  05/2014 admitted for left temporal ICH.  MRI showed chronic bilateral BG infarcts.  UDS positive THC.  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  2D echo unremarkable.  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  Hypertension  Stable . Long-term BP goal normotensive  Hyperlipidemia  Lipid lowering medication PTA:  Pravachol 20 mg daily  LDL 26, goal < 70  Current lipid lowering medication: none given low LDL at this time  Continue statin at discharge  Other Stroke Risk Factors  Advanced age  Former cigarette smoker - quit   Other Active Problems  Leukopenia - 2.6  Hypokalemia - supplemented  Mildly elevated LFTs  Hospital day # 0  Neurology will sign off. Please call with questions. Pt will follow up with stroke clinic NP at [facility] in about 4 weeks. Thanks for the consult.  Marvel Plan, MD PhD Stroke Neurology 12/07/2018 2:31 PM    To contact Stroke Continuity provider, please refer to [email or web address]. After hours, contact General Neurology

## 2018-12-07 NOTE — Progress Notes (Signed)
  Echocardiogram 2D Echocardiogram has been performed.  Leta Jungling M 12/07/2018, 2:21 PM

## 2018-12-07 NOTE — Evaluation (Signed)
Speech Language Pathology Evaluation Patient Details Name: Lindsey Leon MRN: [id number] DOB: Jun 10, 1956 Today's Date: 12/07/2018 Time:  -     Problem List:  Patient Active Problem List   Diagnosis Date Noted  . TIA (transient ischemic attack) 12/06/2018  . ICH (intracerebral hemorrhage) (HCC) 05/23/2014  . Diverticulitis of colon 02/24/2013  . Pancreatitis, Hx of 02/24/2013  . Alcohol abuse 02/24/2013   Past Medical History:  Past Medical History:  Diagnosis Date  . Diverticulitis   . Hypertension    not on medications  . Pancreatitis   . Stroke [facility])    Past Surgical History:  Past Surgical History:  Procedure Laterality Date  . FLEXIBLE SIGMOIDOSCOPY N/A 02/26/2013   Procedure: FLEXIBLE SIGMOIDOSCOPY;  Surgeon: Theda Belfast, MD;  Location: [facility] ENDOSCOPY;  Service: Endoscopy;  Laterality: N/A;   HPI:  Pt is a 63 year old lady with past medical history of stroke (without major deficit), HTN, former smoker, former alcohol use, presented with right arm and hand weakness. CT of the head revealed high right parietal cortical nonhemorrhagic infarct, separate from the central sulcus and may be acute. Remote lacunar infarcts of the basal ganglia and thalami bilaterally.    Assessment / Plan / Recommendation Clinical Impression  Pt demonstrates moderate cognitive impairment and mild language impairment. Subetests of Cognistat administered; working memory and awareness are severely impaired to the point where pt cannot follow 2 step directions, repeat more than 2-3 linguistic or numerical units. She also demonstrates mild language impairment; comperhension of moderately complex language requires repetition, pt often perseveratives with word finding without awareness. Her sister confirms she has had concerns about pt actually preparing meals after taking her shopping; she may only be eating consistently once a day when lunch is served in her senior living apartment. Pt also unable to  self administer her medications; she cannot recall what meds she is taking and verbalizes she is often not sure during each day whether she has taken them or not. Sister reports pt will not answer phone for days because she has misplaced it. Pt demonstrates cognitive decline to the point where increased assistance for meals, meds, communication is needed. Discussed with pts sister and Child psychotherapist. Called MD without response. F/u home health SLP is needed.     SLP Assessment  SLP Recommendation/Assessment: Patient needs continued Speech Lanaguage Pathology Services SLP Visit Diagnosis: Frontal lobe and executive function deficit Frontal lobe and executive function deficit following: Other cerebrovascular disease    Follow Up Recommendations  24 hour supervision/assistance;Other (comment)(Assisted living/assistance for ADLS)    Frequency and Duration min 2x/week  2 weeks      SLP Evaluation Cognition  Overall Cognitive Status: Impaired/Different from baseline Arousal/Alertness: Awake/alert Orientation Level: Oriented to person;Oriented to place;Disoriented to time Attention: Focused;Sustained Focused Attention: Appears intact Sustained Attention: Appears intact Memory: Impaired Memory Impairment: Storage deficit;Retrieval deficit;Decreased short term memory Decreased Short Term Memory: Verbal basic;Functional basic Awareness: Impaired Awareness Impairment: Intellectual impairment Problem Solving: Impaired Problem Solving Impairment: Verbal basic;Functional basic Executive Function: Reasoning;Decision Making;Self Monitoring Reasoning: Impaired Reasoning Impairment: Verbal basic Decision Making: Impaired Decision Making Impairment: Verbal basic Self Monitoring: Impaired Self Monitoring Impairment: Verbal basic Safety/Judgment: Impaired       Comprehension  Auditory Comprehension Overall Auditory Comprehension: Impaired Yes/No Questions: Within Functional Limits Commands:  Impaired One Step Basic Commands: 75-100% accurate Two Step Basic Commands: 0-24% accurate Conversation: Simple Interfering Components: Working Diplomatic Services operational officer Expression Overall Verbal Expression: Impaired Initiation: No impairment Automatic Speech: Name;Social  Response Level of Generative/Spontaneous Verbalization: Conversation Repetition: Impaired Level of Impairment: Sentence level Naming: Impairment Responsive: Not tested Confrontation: Impaired Convergent: Not tested Divergent: Not tested Verbal Errors: Perseveration;Not aware of errors Pragmatics: No impairment Interfering Components: Attention Written Expression Dominant Hand: Right   Oral / Motor  Oral Motor/Sensory Function Overall Oral Motor/Sensory Function: Within functional limits Motor Speech Overall Motor Speech: Appears within functional limits for tasks assessed   GO                   Harlon Ditty, MA CCC-SLP  Acute Rehabilitation Services Pager [phone] Office [phone]  Claudine Mouton 12/07/2018, 2:04 PM

## 2018-12-09 ENCOUNTER — Telehealth: Payer: Self-pay | Admitting: *Deleted

## 2018-12-09 ENCOUNTER — Encounter (HOSPITAL_COMMUNITY): Payer: Self-pay | Admitting: Emergency Medicine

## 2018-12-09 ENCOUNTER — Telehealth: Payer: Self-pay | Admitting: Internal Medicine

## 2018-12-09 LAB — HIV ANTIBODY (ROUTINE TESTING W REFLEX): HIV Screen 4th Generation wRfx: REACTIVE — AB

## 2018-12-09 LAB — FOLATE RBC
Folate, Hemolysate: 369 ng/mL
Folate, RBC: 1085 ng/mL (ref >498)
HEMATOCRIT: 34 % (ref 34.0–46.6)

## 2018-12-09 LAB — HIV 1/2 AB DIFFERENTIATION
HIV 1 AB: POSITIVE — AB
HIV 2 AB: NEGATIVE

## 2018-12-09 NOTE — Telephone Encounter (Signed)
Per notification information sent to [facility] for follow up.

## 2018-12-09 NOTE — Telephone Encounter (Signed)
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  Please let [facility] know.  Thanks.  Jonny Ruiz

## 2018-12-09 NOTE — Telephone Encounter (Signed)
spoke with pt about positive HIV screening result.  She was given address and phone number of our infectious disease clinic and will set an appointment.

## 2018-12-12 ENCOUNTER — Ambulatory Visit: Payer: Self-pay

## 2018-12-13 ENCOUNTER — Ambulatory Visit: Payer: Self-pay

## 2018-12-15 DIAGNOSIS — B182 Chronic viral hepatitis C: Secondary | ICD-10-CM | POA: Insufficient documentation

## 2018-12-24 ENCOUNTER — Emergency Department (HOSPITAL_COMMUNITY): Payer: Medicaid Other

## 2018-12-24 ENCOUNTER — Other Ambulatory Visit: Payer: Self-pay

## 2018-12-24 ENCOUNTER — Observation Stay (HOSPITAL_COMMUNITY): Payer: Medicaid Other

## 2018-12-24 ENCOUNTER — Encounter (HOSPITAL_COMMUNITY): Payer: Self-pay

## 2018-12-24 ENCOUNTER — Inpatient Hospital Stay (HOSPITAL_COMMUNITY)
Admission: EM | Admit: 2018-12-24 | Discharge: 2019-01-01 | DRG: 069 | Disposition: A | Discharge status: Skilled Nursing Facility | Payer: Medicaid Other | Attending: Internal Medicine | Admitting: Internal Medicine

## 2018-12-24 DIAGNOSIS — Z7902 Long term (current) use of antithrombotics/antiplatelets: Secondary | ICD-10-CM

## 2018-12-24 DIAGNOSIS — B20 Human immunodeficiency virus [HIV] disease: Secondary | ICD-10-CM | POA: Diagnosis present

## 2018-12-24 DIAGNOSIS — R4189 Other symptoms and signs involving cognitive functions and awareness: Secondary | ICD-10-CM | POA: Diagnosis present

## 2018-12-24 DIAGNOSIS — Z87891 Personal history of nicotine dependence: Secondary | ICD-10-CM

## 2018-12-24 DIAGNOSIS — R4701 Aphasia: Secondary | ICD-10-CM | POA: Diagnosis present

## 2018-12-24 DIAGNOSIS — I6529 Occlusion and stenosis of unspecified carotid artery: Secondary | ICD-10-CM | POA: Diagnosis present

## 2018-12-24 DIAGNOSIS — Z79899 Other long term (current) drug therapy: Secondary | ICD-10-CM

## 2018-12-24 DIAGNOSIS — G8311 Monoplegia of lower limb affecting right dominant side: Secondary | ICD-10-CM | POA: Diagnosis present

## 2018-12-24 DIAGNOSIS — R4182 Altered mental status, unspecified: Secondary | ICD-10-CM | POA: Diagnosis present

## 2018-12-24 DIAGNOSIS — R64 Cachexia: Secondary | ICD-10-CM | POA: Diagnosis present

## 2018-12-24 DIAGNOSIS — Z9114 Patient's other noncompliance with medication regimen: Secondary | ICD-10-CM

## 2018-12-24 DIAGNOSIS — Z23 Encounter for immunization: Secondary | ICD-10-CM

## 2018-12-24 DIAGNOSIS — F0280 Dementia in other diseases classified elsewhere without behavioral disturbance: Secondary | ICD-10-CM | POA: Diagnosis present

## 2018-12-24 DIAGNOSIS — E876 Hypokalemia: Secondary | ICD-10-CM | POA: Diagnosis present

## 2018-12-24 DIAGNOSIS — Z8249 Family history of ischemic heart disease and other diseases of the circulatory system: Secondary | ICD-10-CM

## 2018-12-24 DIAGNOSIS — G459 Transient cerebral ischemic attack, unspecified: Principal | ICD-10-CM | POA: Diagnosis present

## 2018-12-24 DIAGNOSIS — E785 Hyperlipidemia, unspecified: Secondary | ICD-10-CM | POA: Diagnosis present

## 2018-12-24 DIAGNOSIS — B1920 Unspecified viral hepatitis C without hepatic coma: Secondary | ICD-10-CM | POA: Diagnosis present

## 2018-12-24 DIAGNOSIS — R471 Dysarthria and anarthria: Secondary | ICD-10-CM

## 2018-12-24 DIAGNOSIS — I6521 Occlusion and stenosis of right carotid artery: Secondary | ICD-10-CM

## 2018-12-24 DIAGNOSIS — F1290 Cannabis use, unspecified, uncomplicated: Secondary | ICD-10-CM | POA: Diagnosis present

## 2018-12-24 DIAGNOSIS — I639 Cerebral infarction, unspecified: Secondary | ICD-10-CM

## 2018-12-24 DIAGNOSIS — F0390 Unspecified dementia without behavioral disturbance: Secondary | ICD-10-CM

## 2018-12-24 DIAGNOSIS — I10 Essential (primary) hypertension: Secondary | ICD-10-CM | POA: Diagnosis present

## 2018-12-24 DIAGNOSIS — Z833 Family history of diabetes mellitus: Secondary | ICD-10-CM

## 2018-12-24 DIAGNOSIS — R29810 Facial weakness: Secondary | ICD-10-CM | POA: Diagnosis present

## 2018-12-24 DIAGNOSIS — Z681 Body mass index (BMI) 19 or less, adult: Secondary | ICD-10-CM

## 2018-12-24 DIAGNOSIS — K921 Melena: Secondary | ICD-10-CM | POA: Diagnosis not present

## 2018-12-24 DIAGNOSIS — R011 Cardiac murmur, unspecified: Secondary | ICD-10-CM

## 2018-12-24 DIAGNOSIS — Z7982 Long term (current) use of aspirin: Secondary | ICD-10-CM

## 2018-12-24 LAB — I-STAT TROPONIN, ED: Troponin i, poc: 0 ng/mL (ref 0.00–0.08)

## 2018-12-24 LAB — URINALYSIS, ROUTINE W REFLEX MICROSCOPIC
Bilirubin Urine: NEGATIVE
Glucose, UA: NEGATIVE mg/dL
Hgb urine dipstick: NEGATIVE
KETONES UR: NEGATIVE mg/dL
Leukocytes,Ua: NEGATIVE
NITRITE: NEGATIVE
PH: 7 (ref 5.0–8.0)
Protein, ur: NEGATIVE mg/dL
Specific Gravity, Urine: 1.034 — ABNORMAL HIGH (ref 1.005–1.030)

## 2018-12-24 LAB — DIFFERENTIAL
Basophils Absolute: 0 10*3/uL (ref 0.0–0.1)
Basophils Relative: 0 %
Eosinophils Absolute: 0 10*3/uL (ref 0.0–0.5)
Eosinophils Relative: 2 %
Lymphocytes Relative: 46 %
Lymphs Abs: 1.2 10*3/uL (ref 0.7–4.0)
Monocytes Absolute: 0.3 10*3/uL (ref 0.1–1.0)
Monocytes Relative: 10 %
Neutro Abs: 1 10*3/uL — ABNORMAL LOW (ref 1.7–7.7)
Neutrophils Relative %: 41 %

## 2018-12-24 LAB — COMPREHENSIVE METABOLIC PANEL
ALT: 35 U/L (ref 0–44)
AST: 43 U/L — ABNORMAL HIGH (ref 15–41)
Albumin: 3.7 g/dL (ref 3.5–5.0)
Alkaline Phosphatase: 47 U/L (ref 38–126)
Anion gap: 10 (ref 5–15)
BUN: 7 mg/dL — ABNORMAL LOW (ref 8–23)
CALCIUM: 9.3 mg/dL (ref 8.9–10.3)
CO2: 22 mmol/L (ref 22–32)
Chloride: 108 mmol/L (ref 98–111)
Creatinine, Ser: 0.73 mg/dL (ref 0.44–1.00)
Glucose, Bld: 108 mg/dL — ABNORMAL HIGH (ref 70–99)
Potassium: 3.7 mmol/L (ref 3.5–5.1)
Sodium: 140 mmol/L (ref 135–145)
Total Bilirubin: 0.9 mg/dL (ref 0.3–1.2)
Total Protein: 7.2 g/dL (ref 6.5–8.1)

## 2018-12-24 LAB — APTT: aPTT: 25 seconds (ref 24–36)

## 2018-12-24 LAB — PROTIME-INR
INR: 1 (ref 0.8–1.2)
Prothrombin Time: 13 seconds (ref 11.4–15.2)

## 2018-12-24 LAB — LACTIC ACID, PLASMA: Lactic Acid, Venous: 1.2 mmol/L (ref 0.5–1.9)

## 2018-12-24 LAB — RAPID URINE DRUG SCREEN, HOSP PERFORMED
Amphetamines: NOT DETECTED
Barbiturates: NOT DETECTED
Benzodiazepines: NOT DETECTED
Cocaine: NOT DETECTED
Opiates: NOT DETECTED
TETRAHYDROCANNABINOL: NOT DETECTED

## 2018-12-24 LAB — CBC
HCT: 35 % — ABNORMAL LOW (ref 36.0–46.0)
Hemoglobin: 11.7 g/dL — ABNORMAL LOW (ref 12.0–15.0)
MCH: 32.5 pg (ref 26.0–34.0)
MCHC: 33.4 g/dL (ref 30.0–36.0)
MCV: 97.2 fL (ref 80.0–100.0)
NRBC: 0 % (ref 0.0–0.2)
PLATELETS: 101 10*3/uL — AB (ref 150–400)
RBC: 3.6 MIL/uL — ABNORMAL LOW (ref 3.87–5.11)
RDW: 13.5 % (ref 11.5–15.5)
WBC: 2.5 10*3/uL — ABNORMAL LOW (ref 4.0–10.5)

## 2018-12-24 LAB — I-STAT CREATININE, ED: Creatinine, Ser: 0.8 mg/dL (ref 0.44–1.00)

## 2018-12-24 LAB — ETHANOL: Alcohol, Ethyl (B): <10 mg/dL (ref <10)

## 2018-12-24 LAB — CBG MONITORING, ED: Glucose-Capillary: 95 mg/dL (ref 70–99)

## 2018-12-24 MED ORDER — HYDROCHLOROTHIAZIDE 25 MG PO TABS
12.5000 mg | ORAL_TABLET | Freq: Every day | ORAL | Status: DC
  Administered 2018-12-24: 12.5 mg via ORAL
  Filled 2018-12-24: qty 1

## 2018-12-24 MED ORDER — AMLODIPINE BESYLATE 5 MG PO TABS
5.0000 mg | ORAL_TABLET | Freq: Every day | ORAL | Status: DC
  Administered 2018-12-24: 5 mg via ORAL
  Filled 2018-12-24: qty 1

## 2018-12-24 MED ORDER — ASPIRIN EC 81 MG PO TBEC
81.0000 mg | DELAYED_RELEASE_TABLET | Freq: Every day | ORAL | Status: DC
  Administered 2018-12-24 – 2019-01-01 (×9): 81 mg via ORAL
  Filled 2018-12-24 (×10): qty 1

## 2018-12-24 MED ORDER — ACETAMINOPHEN 325 MG PO TABS: 650.0000 mg | ORAL_TABLET | ORAL | Status: DC | PRN

## 2018-12-24 MED ORDER — IOHEXOL 350 MG/ML SOLN
100.0000 mL | Freq: Once | INTRAVENOUS | Status: AC | PRN
  Administered 2018-12-24: 100 mL via INTRAVENOUS

## 2018-12-24 MED ORDER — CLOPIDOGREL BISULFATE 75 MG PO TABS
75.0000 mg | ORAL_TABLET | Freq: Every day | ORAL | Status: DC
  Administered 2018-12-24 – 2019-01-01 (×9): 75 mg via ORAL
  Filled 2018-12-24 (×10): qty 1

## 2018-12-24 MED ORDER — SODIUM CHLORIDE 0.9 % IV SOLN
INTRAVENOUS | Status: AC
  Administered 2018-12-24 – 2018-12-25 (×2): via INTRAVENOUS

## 2018-12-24 MED ORDER — ACETAMINOPHEN 160 MG/5ML PO SOLN: 650.0000 mg | ORAL | Status: DC | PRN

## 2018-12-24 MED ORDER — PRAVASTATIN SODIUM 10 MG PO TABS
20.0000 mg | ORAL_TABLET | Freq: Every day | ORAL | Status: DC
  Administered 2018-12-24: 20 mg via ORAL
  Filled 2018-12-24: qty 2

## 2018-12-24 MED ORDER — STROKE: EARLY STAGES OF RECOVERY BOOK
Freq: Once | Status: AC
  Administered 2018-12-24: 22:00:00

## 2018-12-24 MED ORDER — PNEUMOCOCCAL VAC POLYVALENT 25 MCG/0.5ML IJ INJ
0.5000 mL | INJECTION | INTRAMUSCULAR | Status: DC
  Filled 2018-12-24: qty 0.5

## 2018-12-24 MED ORDER — ACETAMINOPHEN 650 MG RE SUPP: 650.0000 mg | RECTAL | Status: DC | PRN

## 2018-12-24 MED ORDER — SENNOSIDES-DOCUSATE SODIUM 8.6-50 MG PO TABS: 1.0000 | ORAL_TABLET | Freq: Every evening | ORAL | Status: DC | PRN

## 2018-12-24 NOTE — ED Notes (Signed)
Paged Internal medicine MD regarding pt's increasing blood pressure; MD will add orders. Will continue to monitor.

## 2018-12-24 NOTE — ED Notes (Signed)
Pt returned to room from MRI.

## 2018-12-24 NOTE — ED Notes (Signed)
Neurologist bedside.

## 2018-12-24 NOTE — ED Notes (Signed)
Patient transported to MRI 

## 2018-12-24 NOTE — ED Notes (Signed)
Pt returned from CT scan with this RN

## 2018-12-24 NOTE — Progress Notes (Signed)
EEG completed; results pending.    

## 2018-12-24 NOTE — Code Documentation (Signed)
63 yo female coming from home where she lives by herself. Pt was last seen yesterday at 1200 at her baseline. This morning, neighbor went over to check on the patient and she noted that she was having slurred speech. Called EMS. EMS brought her into the ED. While in the ED, pt declined and became aphasic and would not follow commands. ED activated a Code Stroke due to patient becoming VAN + after arrival. Stroke Team met patient in CT. CT Head negative for hemorrhage. CTA negative for LVO. Patient is outside the window for tPA. Not a candidate for IR. Handoff given to Brig[place], Therapist, sports. Pt to continue with stroke work-up.

## 2018-12-24 NOTE — Progress Notes (Signed)
Pt admitted from ED with stroke diagnosis, pt  Alert and oriented, denies any pain at this time, settled in bed with call light at bedside, tele monitor put and verified on pt, safety concern addressed accordingly, was however reassured and will continue to monitor, v/s stable. [name], Lindsey Leon

## 2018-12-24 NOTE — Consult Note (Signed)
Neurology Consultation Reason for Consult: Aphasia and right-sided weakness Referring Physician: Randol Kern  CC: Aphasia  History is obtained from: Patient, EMS  HPI: Lindsey Leon is a 63 y.o. female with a history of recent TIA who was evaluated here in the hospital in late February.  At that time, she had had transient aphasia.  Yesterday around noon, she began having difficulty with her speech again.  This is been persistent since this time, but around 10:30 AM she significantly worsened.   [name]: Noon yesterday tpa given?: no, outside of window  ROS:  to obtain due to aphasia  Past Medical History:  Diagnosis Date  . Diverticulitis   . Hypertension    not on medications  . Pancreatitis   . Stroke [facility])      Family History  Problem Relation Age of Onset  . Healthy Father   . Cancer Mother        passed away. unknown age  . Hypertension Sister   . Diabetes Mellitus II Sister      Social History:  reports that she quit smoking about 3 years ago. Her smoking use included cigarettes. She has a 15.00 pack-year smoking history. She has never used smokeless tobacco. She reports current drug use. Drug: Marijuana. She reports that she does not drink alcohol.   Exam: Current vital signs: BP (!) 131/91   Pulse (!) 144   Temp 98.6 F (37 C) (Rectal)   Resp (!) 22   SpO2 100%  Vital signs in last 24 hours: Temp:  [98.6 F (37 C)] 98.6 F (37 C) (03/10 1035) Pulse Rate:  [144] 144 (03/10 1200) Resp:  [22] 22 (03/10 1200) BP: (131)/(91) 131/91 (03/10 1200) SpO2:  [100 %] 100 % (03/10 1200)   Physical Exam  Constitutional: Appears well-developed and well-nourished.  Psych: Affect appropriate to situation Eyes: No scleral injection HENT: No OP obstrucion Head: Normocephalic.  Cardiovascular: Normal rate and regular rhythm.  Respiratory: Effort normal, non-labored breathing GI: Soft.  No distension. There is no tenderness.  Skin: WDI  Neuro: Mental  Status: Patient is awake, alert, oriented to person, place, month, year, and situation. Patient is able to give a clear and coherent history. She has significant aphasia, she is able to follow some commands, but has expressive >receptive aphasia. Cranial Nerves: II: Visual Fields are full. Pupils are equal, round, and reactive to light.   III,IV, VI: EOMI without ptosis or diploplia.  V: Facial sensation is symmetric to temperature VII: Facial movement with ?  Mild right facial weakness VIII: hearing is intact to voice X: Uvula elevates symmetrically XI: Shoulder shrug is symmetric. XII: tongue is midline without atrophy or fasciculations.  Motor: Tone is normal. Bulk is normal. 5/5 strength was present in bilateral arms as well as her left leg, she has 4/5 weakness of her right leg. Sensory: Sensation is symmetric to light touch and temperature in the arms and legs. 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  Impression: 62 year old female with new onset right leg weakness and aphasia which would be most consistent with an ACA territory infarct, however this appears patent on CTA.  I do think that  she has had acute ischemic stroke, and will need to be admitted for further evaluation.  Recommendations: 1) lipid panel, A1c 2) MRI brain 3) PT, OT, ST 4) continue aspirin and Plavix 5) telemetry 6) stroke team to follow   Ritta Slot, MD [facility] [phone]  If 7pm- 7am, please page neurology on call as listed in AMION.

## 2018-12-24 NOTE — ED Triage Notes (Addendum)
Pt here with AMS that started yesterday at 12pm. Hx stroke in 2015 with no neuro deficits. Warm to touch. Pt became increasingly altered en route to hospital. A/Ox2 (disoriented to time and place for EMS). Pt very agitated and restless for this RN.

## 2018-12-24 NOTE — ED Provider Notes (Signed)
[facility] Emergency Department Provider Note MRN:  [id number]  Arrival date & time: 12/24/18     Chief Complaint   Altered Mental Status   History of Present Illness   Lindsey Leon is a 63 y.o. year-old female with a history of hypertension, stroke, HIV presenting to the ED with chief complaint of altered mental status.  Report of last known normal 12 PM yesterday, here with expressive aphasia, cannot get her words out.  I was unable to obtain an accurate HPI, PMH, or ROS due to the patient's aphasia.  Review of Systems  Positive for aphasia, altered mental status.  Patient's Health History    Past Medical History:  Diagnosis Date  . Diverticulitis   . Hypertension    not on medications  . Pancreatitis   . Stroke [facility])     Past Surgical History:  Procedure Laterality Date  . FLEXIBLE SIGMOIDOSCOPY N/A 02/26/2013   Procedure: FLEXIBLE SIGMOIDOSCOPY;  Surgeon: Theda Belfast, MD;  Location: [facility] ENDOSCOPY;  Service: Endoscopy;  Laterality: N/A;    Family History  Problem Relation Age of Onset  . Healthy Father   . Cancer Mother        passed away. unknown age  . Hypertension Sister   . Diabetes Mellitus II Sister     Social History   Socioeconomic History  . Marital status: Single    Spouse name: Not on file  . Number of children: 1  . Years of education: 24  . Highest education level: Not on file  Occupational History  . Not on file  Social Needs  . Financial resource strain: Not on file  . Food insecurity:    Worry: Not on file    Inability: Not on file  . Transportation needs:    Medical: Not on file    Non-medical: Not on file  Tobacco Use  . Smoking status: Former Smoker    Packs/day: 0.50    Years: 30.00    Pack years: 15.00    Types: Cigarettes    Last attempt to quit: 05/22/2015    Years since quitting: 3.5  . Smokeless tobacco: Never Used  Substance and Sexual Activity  . Alcohol use: No    Alcohol/week: 0.0 standard  drinks  . Drug use: Yes    Types: Marijuana  . Sexual activity: Never  Lifestyle  . Physical activity:    Days per week: Not on file    Minutes per session: Not on file  . Stress: Not on file  Relationships  . Social connections:    Talks on phone: Not on file    Gets together: Not on file    Attends religious service: Not on file    Active member of club or organization: Not on file    Attends meetings of clubs or organizations: Not on file    Relationship status: Not on file  . Intimate partner violence:    Fear of current or ex partner: Not on file    Emotionally abused: Not on file    Physically abused: Not on file    Forced sexual activity: Not on file  Other Topics Concern  . Not on file  Social History Narrative   Patient is single with one child.    patient works at [employer].   Patient is right handed.   Patient has high school education.   Patient drinks 1 cup daily.      Physical Exam  Vital Signs  and Nursing Notes reviewed Vitals:   12/24/18 1035 12/24/18 1200  BP:  (!) 131/91  Pulse:  (!) 144  Resp:  (!) 22  Temp: 98.6 F (37 C)   SpO2:  100%    CONSTITUTIONAL: Well-appearing, NAD NEURO:  Alert and oriented x 3, expressive aphasia, no objective lateralizing weakness or sensation deficits, difficulty following commands EYES:  eyes equal and reactive ENT/NECK:  no LAD, no JVD CARDIO: Regular rate, well-perfused, normal S1 and S2 PULM:  CTAB no wheezing or rhonchi GI/GU:  normal bowel sounds, non-distended, non-tender MSK/SPINE:  No gross deformities, no edema SKIN:  no rash, atraumatic PSYCH:  Appropriate speech and behavior  Diagnostic and Interventional Summary    EKG Interpretation  Date/Time:  Tuesday December 24 2018 10:40:46 EDT Ventricular Rate:  79 PR Interval:    QRS Duration: 107 QT Interval:  403 QTC Calculation: 462 R Axis:   45 Text Interpretation:  Sinus rhythm Nonspecific T abnormalities, lateral leads Confirmed by  Kennis Carina [phone]) on 12/24/2018 11:06:11 AM Also confirmed by Kennis Carina [phone]), editor Barbette Hair [phone])  on 12/24/2018 12:02:25 PM      Labs Reviewed  COMPREHENSIVE METABOLIC PANEL - Abnormal; Notable for the following components:      Result Value   Glucose, Bld 108 (*)    BUN 7 (*)    AST 43 (*)    All other components within normal limits  CBC - Abnormal; Notable for the following components:   WBC 2.5 (*)    RBC 3.60 (*)    Hemoglobin 11.7 (*)    HCT 35.0 (*)    All other components within normal limits  URINALYSIS, ROUTINE W REFLEX MICROSCOPIC - Abnormal; Notable for the following components:   Specific Gravity, Urine 1.034 (*)    All other components within normal limits  LACTIC ACID, PLASMA  PROTIME-INR  APTT  LACTIC ACID, PLASMA  ETHANOL  RAPID URINE DRUG SCREEN, HOSP PERFORMED  DIFFERENTIAL  CBG MONITORING, ED  I-STAT TROPONIN, ED  I-STAT CREATININE, ED    CT Angio Head W or Wo Contrast  Final Result    CT Angio Neck W and/or Wo Contrast  Final Result    CT CEREBRAL PERFUSION W CONTRAST  Final Result    CT HEAD CODE STROKE WO CONTRAST  Final Result    MR BRAIN WO CONTRAST    (Results Pending)    Medications  iohexol (OMNIPAQUE) 350 MG/ML injection 100 mL (100 mLs Intravenous Contrast Given 12/24/18 1132)     Procedures Critical Care Critical Care Documentation Critical care time provided by me (excluding procedures): 34 minutes  Condition necessitating critical care: Concern for acute ischemic stroke  Components of critical care management: reviewing of prior records, laboratory and imaging interpretation, frequent re-examination and reassessment of vital signs, initiation of code stroke protocol, discussion with consulting services.    ED Course and Medical Decision Making  I have reviewed the triage vital signs and the nursing notes.  Pertinent labs & imaging results that were available during my care of the patient were reviewed by  me and considered in my medical decision making (see below for details).  Recent ED visit and admission for TIA with similar presentation of aphasia, report of last known normal yesterday at noon, was reportedly talking and less aphasic with EMS 30 minutes ago, currently unable to say anything.  Code stroke initiated, CT is pending.  CT is without obvious sign of stroke, neurology still confident that there is a  new ischemic stroke today.  Admitted to hospital service for further care.  MRI is pending.  Elmer Sow. Pilar Plate, MD [facility] Emergency Medicine [facility] [email or web address]  Final Clinical Impressions(s) / ED Diagnoses     ICD-10-CM   1. Acute ischemic stroke (HCC) I63.9   2. Aphasia R47.01     ED Discharge Orders    None         Sabas Sous, MD 12/24/18 1227

## 2018-12-24 NOTE — ED Notes (Signed)
Neurologist at bedside. 

## 2018-12-24 NOTE — ED Notes (Signed)
Phlebotomist at pt bedside. 

## 2018-12-24 NOTE — ED Notes (Signed)
EEG tech at bedside. 

## 2018-12-24 NOTE — H&P (Signed)
Date: 12/24/2018               Patient Name:  KRYSTAN STEARNES MRN: [id number]  DOB: 1956/08/09 Age / Sex: 63 y.o., female   PCP: Default, Provider, MD         Medical Service: Internal Medicine Teaching Service         Attending Physician: Dr. Oswaldo Done, Marquita Palms, *    First Contact: Dr. Maryla Morrow Pager: [phone]  Second Contact: Dr. Antony Contras Pager: [phone]       After Hours (After 5p/  First Contact Pager: [phone]  weekends / holidays): Second Contact Pager: [phone]   Chief Complaint: Speech problem  History of Present Illness: 63 year old female with pertinent past medical history of stroke w/o deficit, intracranial hemorrhage 2015, hypertension, HIV, presented with expressive aphasia.  There was no  family member or friend in the room when I saw the patient. She was able to answer questions to provide history with few words. She was at home today, when she developed difficulty speaking. She denies any associated numbness or weakness. No imbalance and no fall. She denies any nausea or vomiting. No headache. Also no other associated symptoms. She notified her neighbor and they called 911 and brought  to ED.   She had a recent admission due to TIA, when had presented with right hand weakness and expressive aphasia. She  discharged on 12/07/2018 with ASA and Plavix.    Meds:  Current Meds  Medication Sig  . amLODipine (NORVASC) 5 MG tablet Take 5 mg by mouth daily.  . clopidogrel (PLAVIX) 75 MG tablet Take 1 tablet (75 mg total) by mouth daily.  . hydrochlorothiazide (HYDRODIURIL) 12.5 MG tablet Take 12.5 mg by mouth daily.  . pravastatin (PRAVACHOL) 20 MG tablet Take 20 mg by mouth daily.     Allergies: Allergies as of 12/24/2018  . (No Known Allergies)   Past Medical History:  Diagnosis Date  . Diverticulitis   . Hypertension    not on medications  . Pancreatitis   . Stroke [facility])     Family History:  Sister with diabetes and hypertension No family history of  stroke, premature cardiac death or cardiovascular disease.  Social History: Patient lives alone, she quit smoking 3 years ago (with history of 15-pack-year smoking). She uses marijuana sometimes, but denies any recent use. she denies drinking alcohol.   Review of Systems: A complete ROS was negative except as per HPI.   Physical Exam: Blood pressure (!) 131/91, pulse 71, temperature 98.6 F (37 C), temperature source Rectal, resp. rate (!) 22, SpO2 100 %. Vital signs reviewed.  Nursing notes reviewed.  General: Pleasant lady, sitting in the bed in no acute distress. Head and neck: She is atraumatic and normocephalic  Eyes: Extraocular movements are normal CV: RRR, II/VI Systolic murmur at A area Pulm/chest: No respiratory distress, CTA bilaterally, no crackle, no wheezing Abdomen: Abdomen is soft, nontender to palpation, BS are present Extremities: No lower extremity edema, normal perfusion, pulses are normal bilaterally Skin: Is warm and dry Neurologic exam: Patient is alert and oriented x3, has some expressive dysarthria. Can talk 2-3 word sentences. Follow commands, can name objects. No facial droop. No other neurologic abnormality. Finger to nose test is normal. Sensation and motor are intact but.  EKG: personally reviewed my interpretation is normal sinus rhythm   Assessment & Plan by Problem: Active Problems:   CVA (cerebral vascular accident) (HCC)  63 year old female with past medical history of  stroke, left temporal hemorrhage 2015, hypertension, recently Dx.ed HIV, presented with expressive aphasia.  Stroke vsTIA:  She had a recent admission at the end of February, due to TIA: when presented with right hand weakness and expressive aphasia.  Her symptoms resolved after arrival to emergency department,  (Stroke work-up on previous admission: No acute bleeding on head CT and no acute stroke on MR but CTA showed stenosis of right ICA and severe stenosis of bilateral  segments.) HbA1c 2 w ago: 5.8 Lipid panel 2 weeks ago: Total chol: 264, HDL: 21, LDL 26!   She is alert and oriented, on exam today. Not able to express full sentences but follow the commands and is able to name the objects.  No other neural abnormality.  Sensation is intact. Motor is 5/5 on upper and lower extremities.  Head imaging today: Head CT, CTA showed right common carotid occlusion (reported as acute on chronic). How ever, it was present on previous imaging and per neurology note this is not consistent with her current symptoms, so unclear how acute it is.  Will do brain MRI for stroke rule out as well as medical management.  -Continue aspirin and Plavix -Continue pravastatin -Continue cardiac monitoring -Lipid panel -HbA1c -PT OT evaluation and treatment -Neurology is on board, appreciate recommendations -MRI brain--->Chronic changes. No evidence of acute stroke reported. Will resume home pressure medications: Amlodipine and HCTZ  -UDS--> negative   Hypertension: BP is on arrival was at 180s. Initially held home meds permissive hypertension. Since pain MRI came back negative for acute stroke, we will resume home meds. -Home amlodipine -Home HCTZ  Newly diagnosed HIV: Diagnosed on recent screening during last hospitalization.  Has been referred to [facility]. She is not on ART yet. WBC: 2.5 -CD4 -HIV 1 RNA PCR -RPR -QuantiFERON-TB gold -Hep C antibody -HBs antigen -Hep A Ab   Diet: N.P.o. IV fluid: Normal saline 100 mL/h VTE ppx: SCDs Code status: Full  Dispo: Admit patient to Observation with expected length of stay less than 2 midnights.  SignedChevis Pretty, MD 12/24/2018, 12:48 PM  Pager: [phone]

## 2018-12-24 NOTE — ED Notes (Signed)
ED Provider at bedside. 

## 2018-12-24 NOTE — Procedures (Signed)
ELECTROENCEPHALOGRAM REPORT   Patient: Lindsey Leon       Room #: 023C EEG No. ID: [id number] Age: 63 y.o.        Sex: female Referring Physician: Oswaldo Done Report Date:  12/24/2018        Interpreting Physician: Thana Farr  History: Lindsey Leon is an 63 y.o. female with recurrent episode of aphasia  Medications:  HCTZ, Norvasc  Conditions of Recording:  This is a 21 channel routine scalp EEG performed with bipolar and monopolar montages arranged in accordance to the international 10/20 system of electrode placement. One channel was dedicated to EKG recording.  The patient is in the awake and drowsy states.  Description:  The waking background activity consists of a low voltage, symmetrical, fairly well organized, 8-9 Hz alpha activity, seen from the parieto-occipital and posterior temporal regions.  Low voltage fast activity, poorly organized, is seen anteriorly and is at times superimposed on more posterior regions.  A mixture of theta and alpha rhythms are seen from the central and temporal regions. The patient drowses with slowing to irregular, low voltage theta and beta activity.   Stage II sleep is not obtained. No epileptiform activity is noted.   Hyperventilation and intermittent photic stimulation were not performed.   IMPRESSION: Normal electroencephalogram, awake and drowsy. There are no focal lateralizing or epileptiform features.   Thana Farr, MD Neurology [phone] 12/24/2018, 5:47 PM

## 2018-12-24 NOTE — ED Notes (Signed)
Attempted blood work. Unsuccessful attempt

## 2018-12-25 DIAGNOSIS — K921 Melena: Secondary | ICD-10-CM | POA: Diagnosis not present

## 2018-12-25 DIAGNOSIS — R29810 Facial weakness: Secondary | ICD-10-CM | POA: Diagnosis present

## 2018-12-25 DIAGNOSIS — K029 Dental caries, unspecified: Secondary | ICD-10-CM

## 2018-12-25 DIAGNOSIS — B20 Human immunodeficiency virus [HIV] disease: Secondary | ICD-10-CM | POA: Diagnosis present

## 2018-12-25 DIAGNOSIS — I639 Cerebral infarction, unspecified: Secondary | ICD-10-CM | POA: Diagnosis present

## 2018-12-25 DIAGNOSIS — Z833 Family history of diabetes mellitus: Secondary | ICD-10-CM | POA: Diagnosis not present

## 2018-12-25 DIAGNOSIS — R4189 Other symptoms and signs involving cognitive functions and awareness: Secondary | ICD-10-CM | POA: Diagnosis present

## 2018-12-25 DIAGNOSIS — B1920 Unspecified viral hepatitis C without hepatic coma: Secondary | ICD-10-CM | POA: Diagnosis present

## 2018-12-25 DIAGNOSIS — Z23 Encounter for immunization: Secondary | ICD-10-CM | POA: Diagnosis not present

## 2018-12-25 DIAGNOSIS — I10 Essential (primary) hypertension: Secondary | ICD-10-CM

## 2018-12-25 DIAGNOSIS — D61818 Other pancytopenia: Secondary | ICD-10-CM

## 2018-12-25 DIAGNOSIS — R4701 Aphasia: Secondary | ICD-10-CM

## 2018-12-25 DIAGNOSIS — F0280 Dementia in other diseases classified elsewhere without behavioral disturbance: Secondary | ICD-10-CM | POA: Diagnosis present

## 2018-12-25 DIAGNOSIS — E876 Hypokalemia: Secondary | ICD-10-CM | POA: Diagnosis present

## 2018-12-25 DIAGNOSIS — E785 Hyperlipidemia, unspecified: Secondary | ICD-10-CM | POA: Diagnosis present

## 2018-12-25 DIAGNOSIS — G8311 Monoplegia of lower limb affecting right dominant side: Secondary | ICD-10-CM | POA: Diagnosis present

## 2018-12-25 DIAGNOSIS — Z87891 Personal history of nicotine dependence: Secondary | ICD-10-CM | POA: Diagnosis not present

## 2018-12-25 DIAGNOSIS — I6529 Occlusion and stenosis of unspecified carotid artery: Secondary | ICD-10-CM | POA: Diagnosis present

## 2018-12-25 DIAGNOSIS — R4182 Altered mental status, unspecified: Secondary | ICD-10-CM | POA: Diagnosis present

## 2018-12-25 DIAGNOSIS — Z21 Asymptomatic human immunodeficiency virus [HIV] infection status: Secondary | ICD-10-CM

## 2018-12-25 DIAGNOSIS — Z8249 Family history of ischemic heart disease and other diseases of the circulatory system: Secondary | ICD-10-CM | POA: Diagnosis not present

## 2018-12-25 DIAGNOSIS — R64 Cachexia: Secondary | ICD-10-CM | POA: Diagnosis present

## 2018-12-25 DIAGNOSIS — R768 Other specified abnormal immunological findings in serum: Secondary | ICD-10-CM

## 2018-12-25 DIAGNOSIS — Z8679 Personal history of other diseases of the circulatory system: Secondary | ICD-10-CM

## 2018-12-25 DIAGNOSIS — Z7902 Long term (current) use of antithrombotics/antiplatelets: Secondary | ICD-10-CM | POA: Diagnosis not present

## 2018-12-25 DIAGNOSIS — Z681 Body mass index (BMI) 19 or less, adult: Secondary | ICD-10-CM | POA: Diagnosis not present

## 2018-12-25 DIAGNOSIS — Z79899 Other long term (current) drug therapy: Secondary | ICD-10-CM | POA: Diagnosis not present

## 2018-12-25 DIAGNOSIS — Z8719 Personal history of other diseases of the digestive system: Secondary | ICD-10-CM

## 2018-12-25 DIAGNOSIS — G459 Transient cerebral ischemic attack, unspecified: Secondary | ICD-10-CM | POA: Diagnosis present

## 2018-12-25 DIAGNOSIS — I6521 Occlusion and stenosis of right carotid artery: Secondary | ICD-10-CM

## 2018-12-25 DIAGNOSIS — F1290 Cannabis use, unspecified, uncomplicated: Secondary | ICD-10-CM | POA: Diagnosis present

## 2018-12-25 DIAGNOSIS — Z8673 Personal history of transient ischemic attack (TIA), and cerebral infarction without residual deficits: Secondary | ICD-10-CM

## 2018-12-25 LAB — HEPATITIS A ANTIBODY, TOTAL: Hep A Total Ab: POSITIVE — AB

## 2018-12-25 LAB — T-HELPER CELLS (CD4) COUNT (NOT AT ARMC)
CD4 % Helper T Cell: 6 % — ABNORMAL LOW (ref 33–55)
CD4 T Cell Abs: 50 /uL — ABNORMAL LOW (ref 400–2700)

## 2018-12-25 LAB — HEPATITIS B SURFACE ANTIGEN: Hepatitis B Surface Ag: NEGATIVE

## 2018-12-25 LAB — HEPATITIS B SURFACE ANTIBODY, QUANTITATIVE: Hep B S AB Quant (Post): <3.1 m[IU]/mL — ABNORMAL LOW (ref >9.9)

## 2018-12-25 LAB — RPR: RPR Ser Ql: NONREACTIVE

## 2018-12-25 LAB — HEPATITIS C ANTIBODY

## 2018-12-25 MED ORDER — BICTEGRAVIR-EMTRICITAB-TENOFOV 50-200-25 MG PO TABS
1.0000 | ORAL_TABLET | Freq: Every day | ORAL | Status: DC
  Administered 2018-12-25 – 2019-01-01 (×8): 1 via ORAL
  Filled 2018-12-25 (×8): qty 1

## 2018-12-25 MED ORDER — SULFAMETHOXAZOLE-TRIMETHOPRIM 800-160 MG PO TABS: 1.0000 | ORAL_TABLET | Freq: Every day | ORAL | 0 refills | Status: DC

## 2018-12-25 MED ORDER — SULFAMETHOXAZOLE-TRIMETHOPRIM 800-160 MG PO TABS
1.0000 | ORAL_TABLET | Freq: Every day | ORAL | Status: DC
  Administered 2018-12-25 – 2019-01-01 (×8): 1 via ORAL
  Filled 2018-12-25 (×9): qty 1

## 2018-12-25 MED ORDER — BICTEGRAVIR-EMTRICITAB-TENOFOV 50-200-25 MG PO TABS: 1.0000 | ORAL_TABLET | Freq: Every day | ORAL | 0 refills | Status: DC

## 2018-12-25 MED FILL — BIKTARVY 50-200-25 MG TABS: 50-200-25 | 30 days supply | Qty: 30 | Fill #0

## 2018-12-25 MED FILL — SULFAMETHOXAZOLE-TMP DS TAB: 800-160 | 30 days supply | Qty: 30 | Fill #0

## 2018-12-25 NOTE — Consult Note (Addendum)
[place] for Infectious Disease    Date of Admission:  12/24/2018     Total days of antibiotics 0               Reason for Consult: HIV    Referring Provider: Evette Doffing Primary Care Provider: Default, Provider, MD   Assessment/Plan:  Lindsey Leon is a 63 y/o female with previous history of TIA and recently diagnosed with HIV-1 admitted with expressive aphasia with no acute infarction found on MRI. I met with Lindsey Leon regarding her newly diagnosed HIV disease and we discussed the pathogenesis, transmission, risks of progression and treatments. She does currently have AIDS with a CD4 nadir of 50. Viral load remains pending. Fortunately she does not have any signs/symptoms of opportunistic infection at present. We will get her started on Biktarvy supplemented with Bactrim for OI prophylaxis. I have discussed getting her a 30 day supply of medications prior to discharge and will work on getting her financial assistance paperwork established. Hepatitis C antibody positive and will need to check Hepatitis C RNA level.   1. Start Biktarvy and Bactrim. 2. Will work to start financial assistance paperwork 3. Pharmacy working on 30 day supply. 4. [facility] care per neurosurgery.  5. Check Hepatitis C RNA level.    Active Problems:   CVA (cerebral vascular accident) ([place])   AIDS (acquired immune deficiency syndrome) ([place])   . aspirin EC  81 mg Oral Daily  . bictegravir-emtricitabine-tenofovir AF  1 tablet Oral Daily  . clopidogrel  75 mg Oral Daily  . pneumococcal 23 valent vaccine  0.5 mL Intramuscular Tomorrow-1000  . pravastatin  20 mg Oral QHS  . sulfamethoxazole-trimethoprim  1 tablet Oral Daily     HPI: Lindsey Leon is a 63 y.o. female with previous medical history of hypertension, pancreatitis, and most recently admitted to the hospital from 12/06/18-12/07/18 with TIA and found to have a positive HIV antibody test with confirmation of HIV-1 who was admitted with  expressive aphasia and concerning for acute CVA.  She was at home and began developing difficulty with speech Outside the window for tPA on arrival with CT scan of the head without obvious sign of stroke. MRI with no acute infarct able to be identified. EEG performed without evidence of epileptiform features.   Lindsey Leon was initially diagnosed with positive HIV antibody test on 12/06/18 and confirmed positive with HIV-1. She does not recall if she has ever been tested for HIV in the past. This diagnosis is a shock to her as she is unsure of how she would have acquired HIV. Largest risk factor is heterosexual contact. She has not been sexually active in years and denies any IVDU or blood transfusions.  Currently living through [employer] and is uninsured. There is family in the area, but she has not informed anyone of her positive status at present.  Initial blood work ordered while here shows CD4 nadir of 50. Viral load remains pending. She has a positive Hepatitis C antibody and will require viral load to determine infection status. She has no infection with Hepatitis B and is not immune. RPR was non-reactive.    Denies fevers, chills, night sweats, headaches, changes in vision, neck pain/stiffness, nausea, diarrhea, vomiting, lesions or rashes.    Review of Systems: Review of Systems  Constitutional: Negative for chills, fever and malaise/fatigue.  HENT: Negative for sore throat.   Eyes: Negative for blurred vision, double vision and pain.  Respiratory: Negative for  cough and shortness of breath.   Cardiovascular: Negative for chest pain and leg swelling.  Gastrointestinal: Negative for abdominal pain, diarrhea, nausea and vomiting.  Musculoskeletal: Negative for neck pain.  Skin: Negative for rash.  Neurological: Negative for weakness and headaches.  Endo/Heme/Allergies: Does not bruise/bleed easily.     Past Medical History:  Diagnosis Date  . Diverticulitis    . Hypertension    not on medications  . Pancreatitis   . Stroke [facility])     Social History   Tobacco Use  . Smoking status: Former Smoker    Packs/day: 0.50    Years: 30.00    Pack years: 15.00    Types: Cigarettes    Last attempt to quit: 05/22/2015    Years since quitting: 3.5  . Smokeless tobacco: Never Used  Substance Use Topics  . Alcohol use: No    Alcohol/week: 0.0 standard drinks  . Drug use: Yes    Types: Marijuana    Family History  Problem Relation Age of Onset  . Healthy Father   . Cancer Mother        passed away. unknown age  . Hypertension Sister   . Diabetes Mellitus II Sister     No Known Allergies  OBJECTIVE: Blood pressure (!) 156/74, pulse 78, temperature 98.1 F (36.7 C), temperature source Oral, resp. rate 18, height '5\' 6"'$  (1.676 m), weight 54.1 kg, SpO2 100 %.  Physical Exam Constitutional:      General: She is not in acute distress.    Appearance: She is well-developed. She is ill-appearing.  HENT:     Mouth/Throat:     Dentition: Abnormal dentition. Dental caries present.  Eyes:     Conjunctiva/sclera: Conjunctivae normal.  Neck:     Musculoskeletal: Neck supple.  Cardiovascular:     Rate and Rhythm: Normal rate and regular rhythm.     Heart sounds: Normal heart sounds. No murmur. No friction rub. No gallop.   Pulmonary:     Effort: Pulmonary effort is normal. No respiratory distress.     Breath sounds: Normal breath sounds. No wheezing or rales.  Chest:     Chest wall: No tenderness.  Abdominal:     General: Bowel sounds are normal.     Palpations: Abdomen is soft.     Tenderness: There is no abdominal tenderness.  Lymphadenopathy:     Cervical: No cervical adenopathy.  Skin:    General: Skin is warm and dry.     Findings: No rash.  Neurological:     Mental Status: She is alert and oriented to person, place, and time.  Psychiatric:        Mood and Affect: Mood normal.     Lab Results Lab Results  Component Value Date    WBC 2.5 (L) 12/24/2018   HGB 11.7 (L) 12/24/2018   HCT 35.0 (L) 12/24/2018   MCV 97.2 12/24/2018   PLT 101 (L) 12/24/2018    Lab Results  Component Value Date   CREATININE 0.80 12/24/2018   BUN 7 (L) 12/24/2018   NA 140 12/24/2018   K 3.7 12/24/2018   CL 108 12/24/2018   CO2 22 12/24/2018    Lab Results  Component Value Date   ALT 35 12/24/2018   AST 43 (H) 12/24/2018   ALKPHOS 47 12/24/2018   BILITOT 0.9 12/24/2018     Microbiology: No results found for this or any previous visit (from the past 240 hour(s)).   Terri Piedra, [place]  for Infectious Disease [facility] [phone] Pager  12/25/2018  1:35 PM

## 2018-12-25 NOTE — Evaluation (Addendum)
Speech Language Pathology Evaluation Patient Details Name: Lindsey Leon MRN: [id number] DOB: June 05, 1956 Today's Date: 12/25/2018 Time: 0210-0235 SLP Time Calculation (min) (ACUTE ONLY): 25 min  Problem List:  Patient Active Problem List   Diagnosis Date Noted  . HIV disease (HCC) 12/25/2018  . Carotid artery occlusion 12/25/2018  . TIA (transient ischemic attack) 12/06/2018  . ICH (intracerebral hemorrhage) (HCC) 05/23/2014  . Diverticulitis of colon 02/24/2013  . Pancreatitis, Hx of 02/24/2013  . Alcohol abuse 02/24/2013   Past Medical History:  Past Medical History:  Diagnosis Date  . Diverticulitis   . Hypertension    not on medications  . Pancreatitis   . Stroke [facility])    Past Surgical History:  Past Surgical History:  Procedure Laterality Date  . FLEXIBLE SIGMOIDOSCOPY N/A 02/26/2013   Procedure: FLEXIBLE SIGMOIDOSCOPY;  Surgeon: Theda Belfast, MD;  Location: [facility] ENDOSCOPY;  Service: Endoscopy;  Laterality: N/A;   HPI:  Pt is 63 year old woman with a history of TIA, essential hypertension, prior alcohol use disorder and recently diagnosed with HIV-1 who presents with the sudden onset of right hand weakness and difficulty finding words. She was determined to have transient aphasia. MRI of the brain was negative for any acute findings.   Assessment / Plan / Recommendation Clinical Impression  Pt presents with cognitive-linguistic deficits that make pt unsafe for living independently as she lacks the abilities to appropriately manage medications and consistently access nutrition. Impairment c/b expressive/receptive language and cognitive deficits found in the areas of memory, attention, orientation and executive function. She reports to live alone and not have many obligations. She affirms previous stroke, but reports no residual effects, suspect pt is unreliable historian given baseline deficits. Confrontational naming task and informal conversation revealed telegraphic  speech, phonemic paraphasias and word-finding difficulty with min- no awareness of errors. She is unable to follow complex commands and displayed difficulty with comprehension of written information. Administration of MOCA terminated as tasks proved too complex for pt. Partial testing revealed naming, memory and visuospatial deficits with disoriented clock drawing. She is oriented to person, place and situation, but stated that she thought it was May, 2002. She was only able to give partial answers when given a verbal sequencing task. Decreased sustained attention and language deficits impacted perfomance on all tasks presented. Recommend SLP services to address above areas to increase performance for functional ADLs.    SLP Assessment  SLP Recommendation/Assessment: Patient needs continued Speech Lanaguage Pathology Services SLP Visit Diagnosis: Cognitive communication deficit (R41.841) Frontal lobe and executive function deficit following: Other cerebrovascular disease    Follow Up Recommendations  24 hour supervision/assistance    Frequency and Duration min 2x/week  2 weeks      SLP Evaluation Cognition  Overall Cognitive Status: Impaired/Different from baseline Arousal/Alertness: Awake/alert Orientation Level: Oriented to person;Oriented to place;Oriented to situation;Disoriented to time Attention: Focused;Sustained Focused Attention: Appears intact Sustained Attention: Impaired Sustained Attention Impairment: Verbal complex;Verbal basic Memory: Impaired Memory Impairment: Storage deficit;Retrieval deficit;Decreased short term memory Decreased Short Term Memory: Verbal basic;Functional basic Awareness: Impaired Awareness Impairment: Intellectual impairment Executive Function: Self Monitoring;Sequencing Sequencing: Impaired Sequencing Impairment: Functional complex Self Monitoring: Impaired Self Monitoring Impairment: Verbal basic Behaviors: Perseveration       Comprehension   Auditory Comprehension Overall Auditory Comprehension: Impaired at baseline Yes/No Questions: Not tested Commands: Impaired One Step Basic Commands: 75-100% accurate Two Step Basic Commands: 0-24% accurate Conversation: Simple Interfering Components: Working memory EffectiveTechniques: Extra Presenter, broadcasting Discrimination: Not tested Reading Comprehension Reading Status:  Impaired Word level: Within functional limits Sentence Level: Impaired Paragraph Level: Impaired Functional Environmental (signs, name badge): Not tested Interfering Components: Attention Effective Techniques: Verbal cueing    Expression Expression Primary Mode of Expression: Verbal Verbal Expression Overall Verbal Expression: Impaired Initiation: No impairment Automatic Speech: Name;Social Response Level of Generative/Spontaneous Verbalization: Conversation Naming: Impairment Responsive: Not tested Confrontation: Impaired Convergent: Not tested Divergent: Not tested Verbal Errors: Perseveration;Not aware of errors;Phonemic paraphasias Pragmatics: No impairment Interfering Components: Attention Effective Techniques: Phonemic cues Written Expression Dominant Hand: Right Written Expression: Not tested   Oral / Motor  Oral Motor/Sensory Function Overall Oral Motor/Sensory Function: Within functional limits Motor Speech Overall Motor Speech: Appears within functional limits for tasks assessed   GO                    Gardiner Ramus, Student SLP 12/25/2018, 3:40 PM

## 2018-12-25 NOTE — Evaluation (Signed)
Physical Therapy Evaluation Patient Details Name: Lindsey Leon MRN: [id number] DOB: 1956-01-04 Today's Date: 12/25/2018   History of Present Illness  Pt is a 63 y/o female admitted secondary to aphasia. MRI of the brain was negative for any acute findings. PMH including but not limited to HTN and previous stroke.    Clinical Impression  Pt presented supine in bed with HOB elevated, awake and willing to participate in therapy session. Prior to admission, pt reported that she was independent with all functional mobility and ADLs. Pt lives alone in a single level apartment. At time of evaluation, pt at supervision level for transfers and min guard for ambulation in hallway. Pt with mild instability of gait, especially with higher level balance tasks including head turns and directional changes. Pt would continue to benefit from skilled physical therapy services at this time while admitted and after d/c to address the below listed limitations in order to improve overall safety and independence with functional mobility.     Follow Up Recommendations Home health PT    Equipment Recommendations  None recommended by PT    Recommendations for Other Services       Precautions / Restrictions Precautions Precautions: Fall Restrictions Weight Bearing Restrictions: No      Mobility  Bed Mobility Overal bed mobility: Modified Independent                Transfers Overall transfer level: Needs assistance Equipment used: None Transfers: Sit to/from Stand Sit to Stand: Supervision         General transfer comment: for safety  Ambulation/Gait Ambulation/Gait assistance: Min guard Gait Distance (Feet): 50 Feet(50' x2 with standing rest break) Assistive device: None;IV Pole Gait Pattern/deviations: Step-through pattern;Decreased stride length;Drifts right/left Gait velocity: decreased   General Gait Details: pt with mild instability but no overt LOB or need for physical  assistance, min guard for safety; although pt not using an AD, frequently reaching for support surfaces and then using IV pole for stability  Stairs            Wheelchair Mobility    Modified Rankin (Stroke Patients Only) Modified Rankin (Stroke Patients Only) Pre-Morbid Rankin Score: Slight disability Modified Rankin: Moderately severe disability     Balance Overall balance assessment: Needs assistance Sitting-balance support: Feet supported Sitting balance-Leahy Scale: Good     Standing balance support: No upper extremity supported Standing balance-Leahy Scale: Fair                               Pertinent Vitals/Pain Pain Assessment: No/denies pain    Home Living Family/patient expects to be discharged to:: Private residence Living Arrangements: Alone Available Help at Discharge: Family;Friend(s);Available PRN/intermittently Type of Home: Apartment Home Access: Elevator     Home Layout: One level Home Equipment: None      Prior Function Level of Independence: Independent               Hand Dominance        Extremity/Trunk Assessment   Upper Extremity Assessment Upper Extremity Assessment: Generalized weakness    Lower Extremity Assessment Lower Extremity Assessment: Generalized weakness    Cervical / Trunk Assessment Cervical / Trunk Assessment: Normal  Communication   Communication: Expressive difficulties  Cognition Arousal/Alertness: Awake/alert Behavior During Therapy: WFL for tasks assessed/performed Overall Cognitive Status: Impaired/Different from baseline Area of Impairment: Attention;Memory;Following commands;Safety/judgement;Awareness;Problem solving  Current Attention Level: Selective Memory: Decreased short-term memory Following Commands: Follows one step commands consistently;Follows multi-step commands inconsistently;Follows one step commands with increased time Safety/Judgement:  Decreased awareness of safety;Decreased awareness of deficits Awareness: Emergent Problem Solving: Slow processing;Decreased initiation        General Comments      Exercises     Assessment/Plan    PT Assessment Patient needs continued PT services  PT Problem List Decreased strength;Decreased balance;Decreased mobility;Decreased coordination;Decreased cognition;Decreased knowledge of use of DME;Decreased safety awareness;Decreased knowledge of precautions       PT Treatment Interventions DME instruction;Gait training;Stair training;Functional mobility training;Therapeutic activities;Therapeutic exercise;Balance training;Neuromuscular re-education;Patient/family education;Cognitive remediation    PT Goals (Current goals can be found in the Care Plan section)  Acute Rehab PT Goals Patient Stated Goal: "go back home" PT Goal Formulation: With patient Time For Goal Achievement: 01/08/19 Potential to Achieve Goals: Good    Frequency Min 3X/week   Barriers to discharge        Co-evaluation               AM-PAC PT "6 Clicks" Mobility  Outcome Measure Help needed turning from your back to your side while in a flat bed without using bedrails?: None Help needed moving from lying on your back to sitting on the side of a flat bed without using bedrails?: None Help needed moving to and from a bed to a chair (including a wheelchair)?: A Little Help needed standing up from a chair using your arms (e.g., wheelchair or bedside chair)?: None Help needed to walk in hospital room?: A Little Help needed climbing 3-5 steps with a railing? : A Little 6 Click Score: 21    End of Session   Activity Tolerance: Patient tolerated treatment well Patient left: in bed;with call bell/phone within reach;with bed alarm set;Other (comment)(sitting EOB) Nurse Communication: Mobility status PT Visit Diagnosis: Other abnormalities of gait and mobility (R26.89)    Time: 7672-0947 PT Time  Calculation (min) (ACUTE ONLY): 24 min   Charges:   PT Evaluation $PT Eval Moderate Complexity: 1 Mod PT Treatments $Therapeutic Activity: 8-22 mins        Deborah Chalk, PT, DPT  Acute Rehabilitation Services Pager [phone] Office [phone]    Lindsey Leon 12/25/2018, 9:58 AM

## 2018-12-25 NOTE — Progress Notes (Addendum)
STROKE TEAM PROGRESS NOTE   SUBJECTIVE (INTERVAL HISTORY) No family is at the bedside.  Overall her condition is completely resolved. Pt has no complains and neuro intact.    OBJECTIVE Temp:  [97.8 F (36.6 C)-98.6 F (37 C)] 98.1 F (36.7 C) (03/11 1220) Pulse Rate:  [71-83] 78 (03/11 1220) Cardiac Rhythm: Normal sinus rhythm (03/11 0700) Resp:  [13-25] 18 (03/11 1220) BP: (113-206)/(63-109) 156/74 (03/11 1220) SpO2:  [97 %-100 %] 100 % (03/11 1220) Weight:  [54.1 kg] 54.1 kg (03/10 2007)  Recent Labs  Lab 12/24/18 1101  GLUCAP 95   Recent Labs  Lab 12/24/18 1040 12/24/18 1112  NA 140  --   K 3.7  --   CL 108  --   CO2 22  --   GLUCOSE 108*  --   BUN 7*  --   CREATININE 0.73 0.80  CALCIUM 9.3  --    Recent Labs  Lab 12/24/18 1040  AST 43*  ALT 35  ALKPHOS 47  BILITOT 0.9  PROT 7.2  ALBUMIN 3.7   Recent Labs  Lab 12/24/18 1040  WBC 2.5*  NEUTROABS 1.0*  HGB 11.7*  HCT 35.0*  MCV 97.2  PLT 101*   No results for input(s): CKTOTAL, CKMB, CKMBINDEX, TROPONINI in the last 168 hours. Recent Labs    12/24/18 1102  LABPROT 13.0  INR 1.0   Recent Labs    12/24/18 1155  COLORURINE YELLOW  LABSPEC 1.034*  PHURINE 7.0  GLUCOSEU NEGATIVE  HGBUR NEGATIVE  BILIRUBINUR NEGATIVE  KETONESUR NEGATIVE  PROTEINUR NEGATIVE  NITRITE NEGATIVE  LEUKOCYTESUR NEGATIVE       Component Value Date/Time   CHOL 100 12/07/2018 0501   TRIG 264 (H) 12/07/2018 0501   HDL 21 (L) 12/07/2018 0501   CHOLHDL 4.8 12/07/2018 0501   VLDL 53 (H) 12/07/2018 0501   LDLCALC 26 12/07/2018 0501   Lab Results  Component Value Date   HGBA1C 5.8 (H) 12/07/2018      Component Value Date/Time   LABOPIA NONE DETECTED 12/24/2018 1155   COCAINSCRNUR NONE DETECTED 12/24/2018 1155   LABBENZ NONE DETECTED 12/24/2018 1155   AMPHETMU NONE DETECTED 12/24/2018 1155   THCU NONE DETECTED 12/24/2018 1155   LABBARB NONE DETECTED 12/24/2018 1155    Recent Labs  Lab 12/24/18 1102  ETH  <10    I have personally reviewed the radiological images below and agree with the radiology interpretations.  Ct Angio Head W Or Wo Contrast  Result Date: 12/24/2018 CLINICAL DATA:  Stroke. EXAM: CT ANGIOGRAPHY HEAD AND NECK CT PERFUSION BRAIN TECHNIQUE: Multidetector CT imaging of the head and neck was performed using the standard protocol during bolus administration of intravenous contrast. Multiplanar CT image reconstructions and MIPs were obtained to evaluate the vascular anatomy. Carotid stenosis measurements (when applicable) are obtained utilizing NASCET criteria, using the distal internal carotid diameter as the denominator. Multiphase CT imaging of the brain was performed following IV bolus contrast injection. Subsequent parametric perfusion maps were calculated using RAPID software. CONTRAST:  OMNIPAQUE IOHEXOL 350 MG/ML SOLN COMPARISON:  CT head 12/24/2018 FINDINGS: CTA NECK FINDINGS Aortic arch: Atherosclerotic calcification aortic arch and proximal great vessels. Left carotid origin from the innominate artery. Mild stenosis at the origin of the left carotid and left subclavian arteries. 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 Diffuse atherosclerotic calcification throughout the right common carotid artery. Heavily calcified right carotid bifurcation. Right common carotid artery is  patent on CTA of 12/06/2018. Review of the CTA of 12/06/2018 reveals high-grade stenosis proximal right internal carotid artery which was patent on the prior study. Right internal carotid artery is patent today likely from collateral flow from external carotid artery branches. Small caliber right internal carotid artery with decreased enhancement of the right internal carotid artery due to slow flow. Left carotid system: Atherosclerotic calcification throughout the left common carotid artery with mild stenosis. Mild  atherosclerotic disease proximal left internal carotid artery without significant stenosis. Vertebral arteries: Calcified plaque with moderate stenosis at the origin of the vertebral artery bilaterally. Both vertebral arteries are patent to the basilar. Skeleton: Cervical spine degenerative change. No acute skeletal abnormality. Other neck: Negative for mass or adenopathy Upper chest: Small pleural effusions bilaterally. 12 mm sub solid nodule right lung apex unchanged from the prior CT. Review of the MIP images confirms the above findings CTA HEAD FINDINGS Anterior circulation: Atherosclerotic calcification throughout the left cavernous carotid with moderately severe supraclinoid stenosis on the left. Left anterior and middle cerebral arteries patent without stenosis Decreased enhancement in caliber of the right internal carotid artery due to slow flow by from right common carotid artery occlusion. Extensive atherosclerotic disease throughout the right cavernous carotid which appears patent. There is flow in the supraclinoid internal carotid artery on the right. Right anterior cerebral artery widely patent. Right M1 and M2 segments patent. There is stenosis of M3 branch to the right parietal lobe where there is delayed perfusion. Posterior circulation: Both vertebral arteries patent to the basilar. PICA patent bilaterally. Basilar widely patent. Superior cerebellar and posterior cerebral arteries patent bilaterally. Moderate stenosis of the posterior cerebral artery bilaterally. Venous sinuses: Not well evaluated due to arterial phase scanning Anatomic variants: Negative Delayed phase: Not perform Review of the MIP images confirms the above findings CT Brain Perfusion Findings: CBF (<30%) Volume: 0mL Perfusion (Tmax>6.0s) volume: 5mL Mismatch Volume: 5mL Infarction Location:Right parietal white matter Infarct Core: 0 mL IMPRESSION: 1. Negative for emergent large vessel occlusion. There is right M3 branch stenosis.  There is a small area of delayed perfusion right parietal white matter 5 mL volume. 2. Acute occlusion right common carotid artery. This was patent on recent CTA of 12/06/2018. Severe atherosclerotic disease right carotid bifurcation. Right internal carotid artery is patent from external to internal artery chronic collaterals however there is markedly diminished flow in the right internal carotid artery. There is extensive disease in the cavernous carotid bilaterally. 3. 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 Moderate stenosis of the posterior cerebral artery bilaterally. 4. 12 mm sub solid nodule right lung apex unchanged from prior CT. Initial follow-up by chest CT without contrast is recommended in 3 months to confirm persistence. This recommendation follows the consensus statement: Recommendations for the Management of Subsolid Pulmonary Nodules Detected at CT: A Statement from the Fleischner Society as published in Radiology 2013; 266:304-317. 5. These results were called by telephone at the time of interpretation on 12/24/2018 at 11:56 am to Dr. Amada Jupiter , who verbally acknowledged these results. Electronically Signed   By: Marlan Palau M.D.   On: 12/24/2018 12:03   Ct Angio Head W Or Wo Contrast  Result Date: 12/06/2018 CLINICAL DATA:  Follow-up TIA.  History of hypertension and stroke. EXAM: CT ANGIOGRAPHY HEAD AND NECK TECHNIQUE: Multidetector CT imaging of the head and neck was performed using the standard protocol during bolus administration of intravenous contrast. Multiplanar CT image reconstructions and MIPs were obtained to evaluate the  vascular anatomy. Carotid stenosis measurements (when applicable) are obtained utilizing NASCET criteria, using the distal internal carotid diameter as the denominator. CONTRAST:  75mL ISOVUE-370 IOPAMIDOL (ISOVUE-370) INJECTION 76% COMPARISON:  CT HEAD December 06, 2018 at 1208 hours FINDINGS: CTA NECK FINDINGS: AORTIC  ARCH: 2 vessel arch is a normal variant. Moderate calcific atherosclerosis aortic arch. The origins of the innominate, left Common carotid artery and subclavian artery are patent, mild luminal irregularity subclavian artery consistent with atherosclerosis. RIGHT CAROTID SYSTEM: Common carotid artery is patent, intimal thickening resulting in 50% stenosis proximal common carotid artery. Tandem severe stenosis RIGHT common carotid artery with disproportionate decreased contrast opacification. Severe calcific atherosclerosis RIGHT carotid bifurcation with short segment critical stenosis. Patent cervical internal carotid artery with robust contrast opacification consistent with retrograde flow. LEFT CAROTID SYSTEM: Common carotid artery is patent, moderate calcific atherosclerosis without stenosis. Mild calcific atherosclerosis of the carotid bifurcation without hemodynamically significant stenosis by NASCET criteria. Normal appearance of the internal carotid artery. VERTEBRAL ARTERIES:RIGHT vertebral artery is dominant. Calcific atherosclerosis resulting in moderate stenosis bilateral V1 segments. SKELETON: No acute osseous process though bone windows have not been submitted. Poor dentition. OTHER NECK: Soft tissues of the neck are nonacute though, not tailored for evaluation. UPPER CHEST: Included lung apices are clear. Mild centrilobular emphysema. No superior mediastinal lymphadenopathy. CTA HEAD FINDINGS: ANTERIOR CIRCULATION: Patent cervical internal carotid arteries, petrous, cavernous and supra clinoid internal carotid arteries, asymmetrically decreased RIGHT contrast opacification. Patent anterior communicating artery. Patent anterior and middle cerebral arteries. No large vessel occlusion, flow-limiting stenosis, contrast extravasation or aneurysm. POSTERIOR CIRCULATION: Patent vertebral arteries, vertebrobasilar junction and basilar artery, as well as main branch vessels. Patent posterior cerebral arteries.  Severe stenosis bilateral P2 segments. No large vessel occlusion, flow-limiting stenosis, contrast extravasation or aneurysm. VENOUS SINUSES: Major dural venous sinuses are patent though not tailored for evaluation on this angiographic examination. ANATOMIC VARIANTS: None. DELAYED PHASE: Not performed. MIP images reviewed. IMPRESSION: CTA NECK: 1. Critical stenosis RIGHT ICA with tandem severe RIGHT CCA stenosis. 2. Patent bilateral vertebral arteries with moderate V1 stenosis. CTA HEAD: 1. No emergent large vessel occlusion. Severe stenosis bilateral P2 segments. Aortic Atherosclerosis (ICD10-I70.0). Electronically Signed   By: Awilda Metro M.D.   On: 12/06/2018 12:59   Ct Angio Neck W And/or Wo Contrast  Result Date: 12/24/2018 CLINICAL DATA:  Stroke. EXAM: CT ANGIOGRAPHY HEAD AND NECK CT PERFUSION BRAIN TECHNIQUE: Multidetector CT imaging of the head and neck was performed using the standard protocol during bolus administration of intravenous contrast. Multiplanar CT image reconstructions and MIPs were obtained to evaluate the vascular anatomy. Carotid stenosis measurements (when applicable) are obtained utilizing NASCET criteria, using the distal internal carotid diameter as the denominator. Multiphase CT imaging of the brain was performed following IV bolus contrast injection. Subsequent parametric perfusion maps were calculated using RAPID software. CONTRAST:  OMNIPAQUE IOHEXOL 350 MG/ML SOLN COMPARISON:  CT head 12/24/2018 FINDINGS: CTA NECK FINDINGS Aortic arch: Atherosclerotic calcification aortic arch and proximal great vessels. Left carotid origin from the innominate artery. Mild stenosis at the origin of the left carotid and left subclavian arteries. 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 Diffuse atherosclerotic calcification throughout the right common carotid artery. Heavily calcified right carotid  bifurcation. Right common carotid artery is patent on CTA of 12/06/2018. Review of the CTA of 12/06/2018 reveals high-grade stenosis proximal right internal carotid artery which was patent on the prior study. Right internal carotid artery is  patent today likely from collateral flow from external carotid artery branches. Small caliber right internal carotid artery with decreased enhancement of the right internal carotid artery due to slow flow. Left carotid system: Atherosclerotic calcification throughout the left common carotid artery with mild stenosis. Mild atherosclerotic disease proximal left internal carotid artery without significant stenosis. Vertebral arteries: Calcified plaque with moderate stenosis at the origin of the vertebral artery bilaterally. Both vertebral arteries are patent to the basilar. Skeleton: Cervical spine degenerative change. No acute skeletal abnormality. Other neck: Negative for mass or adenopathy Upper chest: Small pleural effusions bilaterally. 12 mm sub solid nodule right lung apex unchanged from the prior CT. Review of the MIP images confirms the above findings CTA HEAD FINDINGS Anterior circulation: Atherosclerotic calcification throughout the left cavernous carotid with moderately severe supraclinoid stenosis on the left. Left anterior and middle cerebral arteries patent without stenosis Decreased enhancement in caliber of the right internal carotid artery due to slow flow by from right common carotid artery occlusion. Extensive atherosclerotic disease throughout the right cavernous carotid which appears patent. There is flow in the supraclinoid internal carotid artery on the right. Right anterior cerebral artery widely patent. Right M1 and M2 segments patent. There is stenosis of M3 branch to the right parietal lobe where there is delayed perfusion. Posterior circulation: Both vertebral arteries patent to the basilar. PICA patent bilaterally. Basilar widely patent. Superior  cerebellar and posterior cerebral arteries patent bilaterally. Moderate stenosis of the posterior cerebral artery bilaterally. Venous sinuses: Not well evaluated due to arterial phase scanning Anatomic variants: Negative Delayed phase: Not perform Review of the MIP images confirms the above findings CT Brain Perfusion Findings: CBF (<30%) Volume: 0mL Perfusion (Tmax>6.0s) volume: 5mL Mismatch Volume: 5mL Infarction Location:Right parietal white matter Infarct Core: 0 mL IMPRESSION: 1. Negative for emergent large vessel occlusion. There is right M3 branch stenosis. There is a small area of delayed perfusion right parietal white matter 5 mL volume. 2. Acute occlusion right common carotid artery. This was patent on recent CTA of 12/06/2018. Severe atherosclerotic disease right carotid bifurcation. Right internal carotid artery is patent from external to internal artery chronic collaterals however there is markedly diminished flow in the right internal carotid artery. There is extensive disease in the cavernous carotid bilaterally. 3. 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 Moderate stenosis of the posterior cerebral artery bilaterally. 4. 12 mm sub solid nodule right lung apex unchanged from prior CT. Initial follow-up by chest CT without contrast is recommended in 3 months to confirm persistence. This recommendation follows the consensus statement: Recommendations for the Management of Subsolid Pulmonary Nodules Detected at CT: A Statement from the Fleischner Society as published in Radiology 2013; 266:304-317. 5. These results were called by telephone at the time of interpretation on 12/24/2018 at 11:56 am to Dr. Amada Jupiter , who verbally acknowledged these results. Electronically Signed   By: Marlan Palau M.D.   On: 12/24/2018 12:03   Ct Angio Neck W Or Wo Contrast  Result Date: 12/06/2018 CLINICAL DATA:  Follow-up TIA.  History of hypertension and stroke. EXAM: CT  ANGIOGRAPHY HEAD AND NECK TECHNIQUE: Multidetector CT imaging of the head and neck was performed using the standard protocol during bolus administration of intravenous contrast. Multiplanar CT image reconstructions and MIPs were obtained to evaluate the vascular anatomy. Carotid stenosis measurements (when applicable) are obtained utilizing NASCET criteria, using the distal internal carotid diameter as the denominator. CONTRAST:  75mL ISOVUE-370 IOPAMIDOL (ISOVUE-370) INJECTION 76% COMPARISON:  CT HEAD December 06, 2018 at 1208 hours FINDINGS: CTA NECK FINDINGS: AORTIC ARCH: 2 vessel arch is a normal variant. Moderate calcific atherosclerosis aortic arch. The origins of the innominate, left Common carotid artery and subclavian artery are patent, mild luminal irregularity subclavian artery consistent with atherosclerosis. RIGHT CAROTID SYSTEM: Common carotid artery is patent, intimal thickening resulting in 50% stenosis proximal common carotid artery. Tandem severe stenosis RIGHT common carotid artery with disproportionate decreased contrast opacification. Severe calcific atherosclerosis RIGHT carotid bifurcation with short segment critical stenosis. Patent cervical internal carotid artery with robust contrast opacification consistent with retrograde flow. LEFT CAROTID SYSTEM: Common carotid artery is patent, moderate calcific atherosclerosis without stenosis. Mild calcific atherosclerosis of the carotid bifurcation without hemodynamically significant stenosis by NASCET criteria. Normal appearance of the internal carotid artery. VERTEBRAL ARTERIES:RIGHT vertebral artery is dominant. Calcific atherosclerosis resulting in moderate stenosis bilateral V1 segments. SKELETON: No acute osseous process though bone windows have not been submitted. Poor dentition. OTHER NECK: Soft tissues of the neck are nonacute though, not tailored for evaluation. UPPER CHEST: Included lung apices are clear. Mild centrilobular emphysema. No  superior mediastinal lymphadenopathy. CTA HEAD FINDINGS: ANTERIOR CIRCULATION: Patent cervical internal carotid arteries, petrous, cavernous and supra clinoid internal carotid arteries, asymmetrically decreased RIGHT contrast opacification. Patent anterior communicating artery. Patent anterior and middle cerebral arteries. No large vessel occlusion, flow-limiting stenosis, contrast extravasation or aneurysm. POSTERIOR CIRCULATION: Patent vertebral arteries, vertebrobasilar junction and basilar artery, as well as main branch vessels. Patent posterior cerebral arteries. Severe stenosis bilateral P2 segments. No large vessel occlusion, flow-limiting stenosis, contrast extravasation or aneurysm. VENOUS SINUSES: Major dural venous sinuses are patent though not tailored for evaluation on this angiographic examination. ANATOMIC VARIANTS: None. DELAYED PHASE: Not performed. MIP images reviewed. IMPRESSION: CTA NECK: 1. Critical stenosis RIGHT ICA with tandem severe RIGHT CCA stenosis. 2. Patent bilateral vertebral arteries with moderate V1 stenosis. CTA HEAD: 1. No emergent large vessel occlusion. Severe stenosis bilateral P2 segments. Aortic Atherosclerosis (ICD10-I70.0). Electronically Signed   By: Awilda Metro M.D.   On: 12/06/2018 12:59   Mr Brain Wo Contrast  Result Date: 12/24/2018 CLINICAL DATA:  Altered mental status. History of prior strokes. HIV positive. Hypertension. Expressive aphasia. EXAM: MRI HEAD WITHOUT CONTRAST TECHNIQUE: Multiplanar, multiecho pulse sequences of the brain and surrounding structures were obtained without intravenous contrast. COMPARISON:  MR head 12/06/2018. CTA head neck earlier today, 12/24/2018. FINDINGS: The patient was unable to remain motionless for the exam. Small or subtle lesions could be overlooked. Brain: No evidence for acute infarction, hemorrhage, mass lesion, or extra-axial fluid. Advanced for age atrophy. Extensive white matter disease, both focal and confluent  periventricular and subcortical white matter T2 and FLAIR hyperintensity, could be related to chronic microvascular ischemic changes, although such an appearance can also be seen in immunocompromised state. Hydrocephalus ex vacuo. Old BILATERAL thalami, BILATERAL basal ganglia, and RIGHT pontine infarcts. Vascular: Major intracranial flow voids are maintained. Chronic hemorrhage with encephalomalacia, LEFT temporal lobe. Skull and upper cervical spine: No acute findings. Sinuses/Orbits: No layering sinus fluid.  Negative orbits. Other: None. IMPRESSION: Chronic changes as described, similar to prior MR 12/06/2018. An acute infarct is not identified. Atrophy and small vessel disease, multiple old lacunar infarcts, as well as a chronic LEFT temporal hemorrhage with encephalomalacia Electronically Signed   By: Elsie Stain M.D.   On: 12/24/2018 14:56   Mr Brain Wo Contrast  Result Date: 12/06/2018 CLINICAL DATA:  Follow-up TIA.  History of hypertension and stroke. EXAM: MRI HEAD WITHOUT CONTRAST  TECHNIQUE: Multiplanar, multiecho pulse sequences of the brain and surrounding structures were obtained without intravenous contrast. COMPARISON:  CT HEAD December 06, 2018 and MRI head May 24, 2014 FINDINGS: Sequences vary from moderate to severely motion degraded. INTRACRANIAL CONTENTS: No reduced diffusion to suggest acute ischemia. LEFT temporal susceptibility artifact and encephalomalacia at site of prior hemorrhage. Moderate to severe parenchymal brain volume loss, advanced for age. Old bilateral thalami, bilateral basal ganglia and RIGHT pontine small infarcts. Confluent supratentorial white matter FLAIR T2 hyperintensities. VASCULAR: Slow flow RIGHT ICA. SKULL AND UPPER CERVICAL SPINE: No abnormal sellar expansion. No suspicious calvarial bone marrow signal though limited by motion. SINUSES/ORBITS: The mastoid air-cells and included paranasal sinuses are well-aerated.The included ocular globes and orbital  contents are non-suspicious. OTHER: None. IMPRESSION: 1. No acute intracranial process on this motion degraded examination. 2. Old basal ganglia, thalami and pontine infarcts. Old LEFT temporal lobe hemorrhagic infarct. 3. Severe chronic small vessel ischemic changes though can be seen with immunocompromised states. 4. Moderate to severe parenchymal brain volume loss, advanced for age. Electronically Signed   By: Awilda Metro M.D.   On: 12/06/2018 17:48   Ct Cerebral Perfusion W Contrast  Result Date: 12/24/2018 CLINICAL DATA:  Stroke. EXAM: CT ANGIOGRAPHY HEAD AND NECK CT PERFUSION BRAIN TECHNIQUE: Multidetector CT imaging of the head and neck was performed using the standard protocol during bolus administration of intravenous contrast. Multiplanar CT image reconstructions and MIPs were obtained to evaluate the vascular anatomy. Carotid stenosis measurements (when applicable) are obtained utilizing NASCET criteria, using the distal internal carotid diameter as the denominator. Multiphase CT imaging of the brain was performed following IV bolus contrast injection. Subsequent parametric perfusion maps were calculated using RAPID software. CONTRAST:  OMNIPAQUE IOHEXOL 350 MG/ML SOLN COMPARISON:  CT head 12/24/2018 FINDINGS: CTA NECK FINDINGS Aortic arch: Atherosclerotic calcification aortic arch and proximal great vessels. Left carotid origin from the innominate artery. Mild stenosis at the origin of the left carotid and left subclavian arteries. 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 Diffuse atherosclerotic calcification throughout the right common carotid artery. Heavily calcified right carotid bifurcation. Right common carotid artery is patent on CTA of 12/06/2018. Review of the CTA of 12/06/2018 reveals high-grade stenosis proximal right internal carotid artery which was patent on the prior study. Right internal  carotid artery is patent today likely from collateral flow from external carotid artery branches. Small caliber right internal carotid artery with decreased enhancement of the right internal carotid artery due to slow flow. Left carotid system: Atherosclerotic calcification throughout the left common carotid artery with mild stenosis. Mild atherosclerotic disease proximal left internal carotid artery without significant stenosis. Vertebral arteries: Calcified plaque with moderate stenosis at the origin of the vertebral artery bilaterally. Both vertebral arteries are patent to the basilar. Skeleton: Cervical spine degenerative change. No acute skeletal abnormality. Other neck: Negative for mass or adenopathy Upper chest: Small pleural effusions bilaterally. 12 mm sub solid nodule right lung apex unchanged from the prior CT. Review of the MIP images confirms the above findings CTA HEAD FINDINGS Anterior circulation: Atherosclerotic calcification throughout the left cavernous carotid with moderately severe supraclinoid stenosis on the left. Left anterior and middle cerebral arteries patent without stenosis Decreased enhancement in caliber of the right internal carotid artery due to slow flow by from right common carotid artery occlusion. Extensive atherosclerotic disease throughout the right cavernous carotid which appears patent. There is flow in the supraclinoid internal carotid  artery on the right. Right anterior cerebral artery widely patent. Right M1 and M2 segments patent. There is stenosis of M3 branch to the right parietal lobe where there is delayed perfusion. Posterior circulation: Both vertebral arteries patent to the basilar. PICA patent bilaterally. Basilar widely patent. Superior cerebellar and posterior cerebral arteries patent bilaterally. Moderate stenosis of the posterior cerebral artery bilaterally. Venous sinuses: Not well evaluated due to arterial phase scanning Anatomic variants: Negative Delayed  phase: Not perform Review of the MIP images confirms the above findings CT Brain Perfusion Findings: CBF (<30%) Volume: 0mL Perfusion (Tmax>6.0s) volume: 5mL Mismatch Volume: 5mL Infarction Location:Right parietal white matter Infarct Core: 0 mL IMPRESSION: 1. Negative for emergent large vessel occlusion. There is right M3 branch stenosis. There is a small area of delayed perfusion right parietal white matter 5 mL volume. 2. Acute occlusion right common carotid artery. This was patent on recent CTA of 12/06/2018. Severe atherosclerotic disease right carotid bifurcation. Right internal carotid artery is patent from external to internal artery chronic collaterals however there is markedly diminished flow in the right internal carotid artery. There is extensive disease in the cavernous carotid bilaterally. 3. 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 Moderate stenosis of the posterior cerebral artery bilaterally. 4. 12 mm sub solid nodule right lung apex unchanged from prior CT. Initial follow-up by chest CT without contrast is recommended in 3 months to confirm persistence. This recommendation follows the consensus statement: Recommendations for the Management of Subsolid Pulmonary Nodules Detected at CT: A Statement from the Fleischner Society as published in Radiology 2013; 266:304-317. 5. These results were called by telephone at the time of interpretation on 12/24/2018 at 11:56 am to Dr. Amada Jupiter , who verbally acknowledged these results. Electronically Signed   By: Marlan Palau M.D.   On: 12/24/2018 12:03   Ct Head Code Stroke Wo Contrast  Result Date: 12/24/2018 CLINICAL DATA:  Code stroke.  Focal neuro deficit EXAM: CT HEAD WITHOUT CONTRAST TECHNIQUE: Contiguous axial images were obtained from the base of the skull through the vertex without intravenous contrast. COMPARISON:  MRI head 12/06/2018 FINDINGS: Brain: Advanced atrophy. Advanced chronic microvascular  ischemic change throughout the white matter. Chronic infarcts in the thalamus bilaterally. Negative for acute infarct, hemorrhage, or mass. Vascular: Negative for hyperdense vessel Skull: Negative Sinuses/Orbits: Negative Other: None ASPECTS ([place] Stroke Program Early CT Score) - Ganglionic level infarction (caudate, lentiform nuclei, internal capsule, insula, M1-M3 cortex): 7 - Supraganglionic infarction (M4-M6 cortex): 3 Total score (0-10 with 10 being normal): 10 IMPRESSION: 1. No acute intracranial abnormality 2. Advanced atrophy and extensive chronic ischemic change. 3. ASPECTS is 10 4. These results were called by telephone at the time of interpretation on 12/24/2018 at 11:29 am to Dr. Amada Jupiter , who verbally acknowledged these results. Electronically Signed   By: Marlan Palau M.D.   On: 12/24/2018 11:30   Ct Head Code Stroke Wo Contrast  Result Date: 12/06/2018 CLINICAL DATA:  Code stroke. Difficulty speaking and making words. Last seen normal 2.5 Hours ago. EXAM: CT HEAD WITHOUT CONTRAST TECHNIQUE: Contiguous axial images were obtained from the base of the skull through the vertex without intravenous contrast. COMPARISON:  CT head without contrast 08/11/2014 FINDINGS: Brain: Focal area of cortical infarct is present in the high right parietal lobe. Acute or focal cortical abnormality is present along the central sulcus on either side. No focal cortical abnormalities are present along the ganglionic or super ganglionic cortex. Remote lacunar infarcts are present in  the thalami bilaterally and in the caudate head. Other smaller remote lacunar infarcts are present within the basal ganglia bilaterally, similar the prior study. White matter changes extend into the brainstem. The cerebellum is normal. The ventricles are of proportionate to the degree of atrophy. No significant extraaxial fluid collection is present. Vascular: Extensive atherosclerotic calcifications are present. There is no hyperdense  vessel. Skull: Calvarium is intact. No focal lytic or blastic lesions are present. Sinuses/Orbits: The paranasal sinuses and mastoid air cells are clear. The globes and orbits are within normal limits. ASPECTS [facility] Stroke Program Early CT Score) - Ganglionic level infarction (caudate, lentiform nuclei, internal capsule, insula, M1-M3 cortex): 7/7 - Supraganglionic infarction (M4-M6 cortex): 3/3 Total score (0-10 with 10 being normal): 10/10 IMPRESSION: 1. High right parietal cortical nonhemorrhagic infarct, separate from the central sulcus. This is age indeterminate and may be acute. 2. No other acute cortical infarct. 3. Remote lacunar infarcts of the basal ganglia and thalami bilaterally. 4. Moderate generalized atrophy and white matter disease otherwise 5. ASPECTS is 10/10 The above was relayed via text pager to Dr. Amada Jupiter on 12/06/2018 at 12:16 . Electronically Signed   By: Marin Roberts M.D.   On: 12/06/2018 12:17    PHYSICAL EXAM  Temp:  [97.8 F (36.6 C)-98.6 F (37 C)] 98.1 F (36.7 C) (03/11 1220) Pulse Rate:  [71-83] 78 (03/11 1220) Resp:  [13-25] 18 (03/11 1220) BP: (113-206)/(63-109) 156/74 (03/11 1220) SpO2:  [97 %-100 %] 100 % (03/11 1220) Weight:  [54.1 kg] 54.1 kg (03/10 2007)  General - cachectic, well developed, in no apparent distress.  Ophthalmologic - fundi not visualized due to noncooperation.  Cardiovascular - Regular rate and rhythm.  Mental Status -  Level of arousal and orientation to time, place, and person were intact. Language including expression, naming, repetition, comprehension was assessed and found intact.  Cranial Nerves II - XII - II - Visual field intact OU. III, IV, VI - Extraocular movements intact. V - Facial sensation intact bilaterally. VII - Facial movement intact bilaterally. VIII - Hearing & vestibular intact bilaterally. X - Palate elevates symmetrically. XI - Chin turning & shoulder shrug intact bilaterally. XII - Tongue  protrusion intact.  Motor Strength - The patient's strength was normal in all extremities and pronator drift was absent.  Bulk was normal and fasciculations were absent.   Motor Tone - Muscle tone was assessed at the neck and appendages and was normal.  Reflexes - The patient's reflexes were symmetrical in all extremities and she had no pathological reflexes.  Sensory - Light touch, temperature/pinprick were assessed and were symmetrical.    Coordination - The patient had normal movements in the hands and feet with no ataxia or dysmetria.  Tremor was absent.  Gait and Station - deferred.   ASSESSMENT/PLAN [name] is a 63 y.o. female with history of hypertension, ICH, former smoker, HIV, TIA admitted for transient speech difficulty. No tPA given due to symptom resolved.    Left brain TIA vs. complicated migraine without headache  Resultant back to baseline  MRI negative for acute infarct  CTA head and neck no LVO, right M3 stenosis, chronic right CCA stenosis progression to occlusion, right ICA flow decreased, bilateral VAs, PCAs stenosis  2D Echo EF 60 to 65% on 12/07/2018  EEG negative, no seizure  LDL 26 in 11/2018  HgbA1c 5.8 in 11/2018  RPR negative  UDS negative  SCDs for VTE prophylaxis  aspirin 81 mg daily and clopidogrel 75  mg daily prior to admission, now on aspirin 81 mg daily and clopidogrel 75 mg daily.  Continue DAPT on discharge for 3 weeks and then Plavix alone.  Patient stated that she did not take antiplatelets for the last 3 days, she was educated on medication compliance.  Ongoing aggressive stroke risk factor management  Therapy recommendations: Pending  Disposition: Pending  History of TIA  12/07/2018 admitted for transient speech difficulty.  Consider possible left brain TIA.  CT negative.  MRI showed no acute stroke, old bilateral BG/thalamic infarcts.  CTA head neck right ICA severe stenosis tandem right CCA stenosis, bilateral P2 and  the VA origin stenosis.  LDL 26, A1c 5.8.  UDS negative.  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  MRI showed chronic bilateral BG infarcts.  UDS positive THC.  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  2D echo unremarkable.  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  HIV  CD4 50  RPR neg  Pancytopenia  ID consulted  Put on HARRT   Hypertension . Stable  Long term BP goal normotensive  Hyperlipidemia  Home meds: Pravastatin 20  LDL 26, goal < 70  No statin needed at this time due to low LDL  Other Stroke Risk Factors  Advanced age  Former cigarette smoker, advised to stop smoking  Other Active Problems  Pancytopenia with WBC 2.5, hemoglobin 11.7, platelet 101  Hospital day # 0  Neurology will sign off. Please call with questions. Pt will follow up with stroke clinic Dr. Pearlean Brownie at [facility] in about 4 weeks. Thanks for the consult.   Marvel Plan, MD PhD Stroke Neurology 12/25/2018 3:21 PM  I spent  35 minutes in total face-to-face time with the patient, more than 50% of which was spent in counseling and coordination of care, reviewing test results, images and medication, and discussing the diagnosis of TIA, right carotid occlusion, HIV, history of ICH, treatment plan and potential prognosis. This patient's care requiresreview of multiple databases, neurological assessment, discussion with family, other specialists and medical decision making of high complexity.    To contact Stroke Continuity provider, please refer to [email or web address]. After  hours, contact General Neurology

## 2018-12-25 NOTE — Progress Notes (Signed)
ID Pharmacy Note   Lindsey Leon has been approved for a 30-day supply of Biktarvy through [employer]. She has also completed paperwork to continue to receive a supply of medication while awaiting HMAP approval.   A 30-day supply of Biktarvy and Bactrim DS have been sent to the [facility] and will be delivered to patient prior to discharge.   ID pharmacist counseled patient on indication, administration and potential side effects of Biktarvy and Bactrim DS.    Sharin Mons, PharmD, BCPS, BCIDP Infectious Diseases Clinical Pharmacist Phone: [phone]

## 2018-12-25 NOTE — Progress Notes (Addendum)
Occupational Therapy Evaluation Patient Details Name: Lindsey Leon MRN: [id number] DOB: 1956-10-10 Today's Date: 12/25/2018    History of Present Illness 63 y.o. 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    Clinical Impression   Pt living alone at home. Pt with significant cognitive deficits impacting her ability to complete IADL tasks and needs 24/7 S. This therapist is familiar with pt from previous admission in February. Unsteady with gait when distracted or with head turns. Most appropriate DC plan is ALF where someone can assist with meals and medication management and supervise ADL.Pt unable to state reasons for hospitalization. Unable to state her address or identify her address when given a choice of two addresses. When asked to demonstrate what number to call in an emergency, she used the TV remote as she though it was a phone to push buttons "911" - unable to accurately locate the numbers "911" and instead pushed "987". Unable to follow 1 step trail making task and unable to state any of her own medications. When asked what the NP talked to her about when I attempted to see her earlier, she repsonded, "I don't know". He apparently talked with her about her AIDS and the management/treatment/medication. Pt unable to acknowledge/recall the medication he discussed or identify the name of the medication when it was presented to her. Pt is unable to tell me if anyone helps with her medications.  Pt needs 24/7 supervision. If 24/7 S not available she will need SNF. Will follow acutely.    Follow Up Recommendations  HOT if Supervision/Assistance - 24 hour ; if 24/7 S not available, needs SNF   Equipment Recommendations  None  recommended by OT    Recommendations for Other Services       Precautions / Restrictions Precautions Precautions: Fall Restrictions Weight Bearing Restrictions: No      Mobility Bed Mobility Overal bed mobility: Modified Independent                Transfers Overall transfer level: Needs assistance Equipment used: None Transfers: Sit to/from Stand Sit to Stand: Supervision         General transfer comment: for safety    Balance Overall balance assessment: Needs assistance Sitting-balance support: Feet supported Sitting balance-Leahy Scale: Good     Standing balance support: No upper extremity supported Standing balance-Leahy Scale: Fair    Losing balance when turning head/distracted                         ADL either performed or assessed with clinical judgement   ADL Overall ADL's : Needs assistance/impaired                                     Functional mobility during ADLs: Min guard       Vision         Perception     Praxis      Pertinent Vitals/Pain Pain Assessment: No/denies pain     Hand Dominance Right   Extremity/Trunk Assessment Upper Extremity Assessment Upper Extremity Assessment: Generalized weakness   Lower Extremity Assessment Lower Extremity Assessment: Defer to PT evaluation   Cervical /  Trunk Assessment Cervical / Trunk Assessment: Normal   Communication Communication Communication: Expressive difficulties   Cognition Arousal/Alertness: Awake/alert Behavior During Therapy: WFL for tasks assessed/performed Overall Cognitive Status: Impaired/Different from baseline Area of Impairment: Attention;Memory;Following commands;Safety/judgement;Awareness;Problem solving;Orientation                 Orientation Level: Disoriented to;Situation Current Attention Level: Selective Memory: Decreased short-term memory Following Commands: Follows one step commands consistently Safety/Judgement:  Decreased awareness of safety;Decreased awareness of deficits Awareness: Emergent Problem Solving: Slow processing;Decreased initiation General Comments: Unable to state her address, recall medication; floow directions to recall room number; attempting to use remote controll to diall "911", although pushing "987" on remote   General Comments       Exercises     Shoulder Instructions      Home Living Family/patient expects to be discharged to:: Private residence Living Arrangements: Alone Available Help at Discharge: Family;Friend(s);Available PRN/intermittently Type of Home: Apartment Home Access: Elevator     Home Layout: One level     Bathroom Shower/Tub: Chief Strategy Officer: Standard Bathroom Accessibility: Yes How Accessible: Accessible via walker Home Equipment: None          Prior Functioning/Environment Level of Independence: Independent        Comments: takes the bus; sister helps with transportation; does her own medication schedule; makes her meals as needed        OT Problem List: Decreased activity tolerance;Impaired balance (sitting and/or standing);Decreased safety awareness;Decreased cognition      OT Treatment/Interventions: Self-care/ADL training;Therapeutic exercise;Neuromuscular education;DME and/or AE instruction;Therapeutic activities;Cognitive remediation/compensation;Visual/perceptual remediation/compensation;Patient/family education;Balance training    OT Goals(Current goals can be found in the care plan section) Acute Rehab OT Goals Patient Stated Goal: "go back home" OT Goal Formulation: Patient unable to participate in goal setting Time For Goal Achievement: 01/08/19 Potential to Achieve Goals: Fair  OT Frequency: Min 2X/week   Barriers to D/C:            Co-evaluation              AM-PAC OT "6 Clicks" Daily Activity     Outcome Measure Help from another person eating meals?: None Help from another person  taking care of personal grooming?: A Little Help from another person toileting, which includes using toliet, bedpan, or urinal?: None Help from another person bathing (including washing, rinsing, drying)?: None Help from another person to put on and taking off regular upper body clothing?: None Help from another person to put on and taking off regular lower body clothing?: A Little 6 Click Score: 22   End of Session Equipment Utilized During Treatment: Gait belt Nurse Communication: Mobility status  Activity Tolerance: Patient tolerated treatment well Patient left: in bed;with call bell/phone within reach;with bed alarm set  OT Visit Diagnosis: Unsteadiness on feet (R26.81);Muscle weakness (generalized) (M62.81);Other symptoms and signs involving cognitive function                Time: 7001-7494 OT Time Calculation (min): 23 min Charges:  OT General Charges $OT Visit: 1 Visit OT Evaluation $OT Eval Moderate Complexity: 1 Mod OT Treatments $Self Care/Home Management : 8-22 mins    Shimeka Bacot,HILLARY  Luisa Dago, OT/L   Acute OT Clinical Specialist Acute Rehabilitation Services Pager [phone] Office [phone]  12/25/2018, 2:58 PM

## 2018-12-25 NOTE — Progress Notes (Signed)
   Subjective: [name] doing well today, he is able to talk to [place]. Mentions that she came here to the hospital because she felt that she had a stroke. She feels better now. Denies any numbness. No acute event overnight.  Objective:  Vital signs in last 24 hours: Vitals:   12/24/18 2317 12/25/18 0409 12/25/18 0752 12/25/18 1220  BP: (!) 152/69 121/63 (!) 146/68 (!) 156/74  Pulse: 75 79 76 78  Resp: 19 18 18 18   Temp: 98.2 F (36.8 C) 98.6 F (37 C) 97.8 F (36.6 C) 98.1 F (36.7 C)  TempSrc: Oral Oral Oral Oral  SpO2: 100% 97% 98% 100%  Weight:      Height:       Physical Exam Cardiovascular:     Rate and Rhythm: Normal rate and regular rhythm.     Heart sounds: Murmur present.  Pulmonary:     Effort: Pulmonary effort is normal.     Breath sounds: Normal breath sounds.  Abdominal:     Palpations: Abdomen is soft.     Tenderness: There is no abdominal tenderness.  Musculoskeletal:   No edema.  Neurological exam: Improved. No aphasia CN I-XII are normal  She is alert and oriented to place    No sensory deficit.    No weakness.  Psychiatric:        Mood and Affect normal.       Behavior normal.   Assessment/Plan:  Active Problems:   CVA (cerebral vascular accident) (HCC)   AIDS (acquired immune deficiency syndrome) (HCC)  63 year old female with pertinent past medical history of stroke w/o deficit, intracranial hemorrhage 2015, hypertension, HIV, presented with expressive aphasia.  CTA showed right common carotid occlusion but unclear how acute it is.  Brain MRI without evidence of stroke.  Her symptoms resolved today.  Neurologic exam is at baseline and without history of motor weakness, aphasia or focal neurological deficit. CTA revealed right common carotid occlusion, that may be the cause of her symptoms.  Patient mentions that she was not compliant to aspirin and Plavix completely.   Likely TIA.  -Continue ASA, Plavix and Pravastatin -Stroke team is on  board, we appreciate recommendations. -PT/OT eval and treat -Not safe to live alone due to dementia, and as she can not follow her medications schedule -Speech eval--> Passed.  -Heart diet -Will keep permissive HTN  Hypertension: BP is on arrival was at 180s. Initially held home meds permissive hypertension. Since pain MRI came back negative for acute stroke, we will resume home meds. -Hold amlodipine -Hold  HCTZ  Newly diagnosed HIV Not on any medication yet.  CD4 50. Hep C Ab is also positive.  - Infectious disease is following, we appreciate recommendation--> Started Biktarvy and Bactrim. Ordered pneumococcal vaccine -HCV RNA quant  Dispo: Anticipated discharge in approximately 2-3 days   Chevis Pretty, MD 12/25/2018, 2:38 PM Pager: [phone]

## 2018-12-26 DIAGNOSIS — G459 Transient cerebral ischemic attack, unspecified: Principal | ICD-10-CM

## 2018-12-26 DIAGNOSIS — B20 Human immunodeficiency virus [HIV] disease: Secondary | ICD-10-CM

## 2018-12-26 DIAGNOSIS — F0390 Unspecified dementia without behavioral disturbance: Secondary | ICD-10-CM

## 2018-12-26 LAB — QUANTIFERON-TB GOLD PLUS (RQFGPL)
QuantiFERON Mitogen Value: 3.98 IU/mL
QuantiFERON Nil Value: 0.13 IU/mL
QuantiFERON TB1 Ag Value: 0.13 IU/mL
QuantiFERON TB2 Ag Value: 0.13 IU/mL

## 2018-12-26 LAB — QUANTIFERON-TB GOLD PLUS: QuantiFERON-TB Gold Plus: NEGATIVE

## 2018-12-26 NOTE — Progress Notes (Signed)
   Subjective: Patient was seen and evaluated at bedside on morning rounds. No acute events overnight. She is doing well and her speech has been better. She looks concern about her medical condition. Her questions and concerns were addressed. We discussed the plan of care and probable discharge. She agrees that to her sister about the plan after discharge.  Objective:  Vital signs in last 24 hours: Vitals:   12/25/18 1614 12/25/18 1923 12/25/18 2323 12/26/18 0301  BP: (!) 167/70 (!) 142/75 (!) 144/67 140/61  Pulse: 83 81 88 81  Resp: 18 18 18 18   Temp: 98.2 F (36.8 C) 98.9 F (37.2 C) 98.3 F (36.8 C) 98.7 F (37.1 C)  TempSrc: Oral Oral Oral Oral  SpO2: 100% 100% 100% 100%  Weight:      Height:       Physical exam:  Vital signs reviewed General: Pleasant lady, NAD CV: 3-6 systolic murmur, normal S1-S2 Lungs: CTA bilaterally, no respiratory distress Abdomen: Is soft and nontender Neurologic exam: She is alert and oriented at baseline.  Speaks well.  No focal neurologic deficit.  Motor symmetric and normal.  Assessment/Plan:  Principal Problem:   TIA (transient ischemic attack) Active Problems:   HIV disease (HCC)   Carotid artery occlusion   CVA (cerebral vascular accident) (HCC)  63 year old female withpertinentpast medical history of strokew/o deficit, intracranialhemorrhage2015, hypertension, HIV,presented with expressive aphasia. CTAshowedright common carotid occlusion but unclear how acute it is. Brain MRI without evidence of stroke.  TIA and right common carotid occlusion. Her symptoms resolved Neurologic exam is at baseline without motor weakness, aphasia or focal neurological deficit.  -Medically ready to discharge.  She is not safe to live alone due to dementia.  Social worker is on board, talking to patient's sister to decide about plan of care after discharge.(SNF versus home health) -Continue ASA, Plavix for 3 weeks then only Plavix after  discharge -Continue pravastatin after discharge -Holding antihypertensive medication for now after discharge -Follow-up with neurology patient as a scheduled -Follow-up in [facility] next week   Hypertension:BP is 140/80  Permissive hypertension due to to ICA stenosis -Hold amlodipine and HCTZ at discharge  Newly diagnosed HIV Not on any medication yet.  CD4 50. Hep C Ab is also positive. -Continue Biktarvy and Bactrim -Follow-up with infectious disease after discharge (plan by ID) -HCV RNA quant pending   Dispo: Anticipated discharge approximately today (pending until social worker can talk to patient's sister and decide about SNF versus home health)  Chevis Pretty, MD 12/26/2018, 6:50 AM Pager: [phone]

## 2018-12-26 NOTE — Progress Notes (Signed)
[facility]  Date of Admission:  12/24/2018            ASSESSMENT/PLAN  [name] has been admitted with what appears to be a TIA or complicated migraine and newly diagnosed HIV/AIDS from a previous hospital visit. Question if some of her symptoms are related to HIV dementia or progressive HIV disease. She has no signs/symptoms of opportunistic infection currently and is now started on Biktarvy supplemented with Bactrim. We were able to secure 30 day supply of medication and will schedule her for follow up in the ID office. Will likely need continued close follow up.   1. Continue current dose of Biktarvy supplemented with Bactrim for OI prophylaxis.  2. Follow up in ID clinic in 2 weeks.    Principal Problem:   TIA (transient ischemic attack) Active Problems:   HIV disease (HCC)   Carotid artery occlusion   CVA (cerebral vascular accident) (HCC)   . aspirin EC  81 mg Oral Daily  . bictegravir-emtricitabine-tenofovir AF  1 tablet Oral Daily  . clopidogrel  75 mg Oral Daily  . pneumococcal 23 valent vaccine  0.5 mL Intramuscular Tomorrow-1000  . sulfamethoxazole-trimethoprim  1 tablet Oral Daily    SUBJECTIVE:  Afebrile overnight. Doing okay today. Denies adverse side effects of Bactrim or Biktarvy.   No Known Allergies   Review of Systems: Review of Systems  Constitutional: Negative for chills, fever and weight loss.  Respiratory: Negative for cough, shortness of breath and wheezing.   Cardiovascular: Negative for chest pain and leg swelling.  Gastrointestinal: Negative for abdominal pain, constipation, diarrhea, nausea and vomiting.  Skin: Negative for rash.      OBJECTIVE: Vitals:   12/25/18 1923 12/25/18 2323 12/26/18 0301 12/26/18 0745  BP: (!) 142/75 (!) 144/67 140/61 138/67  Pulse: 81 88 81 73  Resp: 18 18 18 18   Temp: 98.9 F (37.2 C) 98.3 F (36.8 C) 98.7 F (37.1 C) 97.8 F (36.6 C)  TempSrc: Oral Oral Oral Oral   SpO2: 100% 100% 100% 98%  Weight:      Height:       Body mass index is 19.25 kg/m.  Physical Exam Constitutional:      General: She is not in acute distress.    Appearance: She is well-developed.  Eyes:     Conjunctiva/sclera: Conjunctivae normal.  Neck:     Musculoskeletal: Neck supple.  Cardiovascular:     Rate and Rhythm: Normal rate and regular rhythm.     Heart sounds: Normal heart sounds. No murmur. No friction rub. No gallop.   Pulmonary:     Effort: Pulmonary effort is normal. No respiratory distress.     Breath sounds: Normal breath sounds. No wheezing or rales.  Chest:     Chest wall: No tenderness.  Abdominal:     General: Bowel sounds are normal.     Palpations: Abdomen is soft.     Tenderness: There is no abdominal tenderness.  Lymphadenopathy:     Cervical: No cervical adenopathy.  Skin:    General: Skin is warm and dry.     Findings: No rash.  Neurological:     Mental Status: She is alert and oriented to person, place, and time.  Psychiatric:        Mood and Affect: Mood normal.     Lab Results Lab Results  Component Value Date   WBC 2.5 (L) 12/24/2018   HGB 11.7 (L) 12/24/2018   HCT 35.0 (  L) 12/24/2018   MCV 97.2 12/24/2018   PLT 101 (L) 12/24/2018    Lab Results  Component Value Date   CREATININE 0.80 12/24/2018   BUN 7 (L) 12/24/2018   NA 140 12/24/2018   K 3.7 12/24/2018   CL 108 12/24/2018   CO2 22 12/24/2018    Lab Results  Component Value Date   ALT 35 12/24/2018   AST 43 (H) 12/24/2018   ALKPHOS 47 12/24/2018   BILITOT 0.9 12/24/2018     Microbiology: No results found for this or any previous visit (from the past 240 hour(s)).   Marcos Eke, NP [facility] [phone] Pager  12/26/2018  11:54 AM

## 2018-12-26 NOTE — Progress Notes (Signed)
Pt had a bowel movement but a small amount of visible bright red blood. Nurse examined patient and noted a hemorrhoid. Dr. Antony Contras paged and notified. Nurse will continue to monitor. Lindsey Leon

## 2018-12-26 NOTE — TOC Initial Note (Signed)
Transition of Care [facility]) - Initial/Assessment Note    Patient Details  Name: Lindsey Leon MRN: [id number] Date of Birth: 11-17-1955  Transition of Care [facility]) CM/SW Contact:    Kermit Balo, RN Phone Number: 12/26/2018, 3:05 PM  Clinical Narrative:                 CM has reached out to Pts sister Lindsey Leon 2 for discussions on d/c planning. Unable to reach sister and no return call.  Expected Discharge Plan: Home w Home Health Services Barriers to Discharge: Unsafe home situation   Patient Goals and CMS Choice        Expected Discharge Plan and Services Expected Discharge Plan: Home w Home Health Services Discharge Planning Services: CM Consult   Living arrangements for the past 2 months: Apartment                          Prior Living Arrangements/Services Living arrangements for the past 2 months: Apartment Lives with:: Self Patient language and need for interpreter reviewed:: Yes(none) Do you feel safe going back to the place where you live?: Yes      Need for Family Participation in Patient Care: Yes (Comment)(pt not taking medications properly and with cognitive issues) Care giver support system in place?: No (comment)   Criminal Activity/Legal Involvement Pertinent to Current Situation/Hospitalization: No - Comment as needed  Activities of Daily Living Home Assistive Devices/Equipment: None ADL Screening (condition at time of admission) Patient's cognitive ability adequate to safely complete daily activities?: Yes Is the patient deaf or have difficulty hearing?: No Does the patient have difficulty seeing, even when wearing glasses/contacts?: No Does the patient have difficulty concentrating, remembering, or making decisions?: Yes Patient able to express need for assistance with ADLs?: Yes Does the patient have difficulty dressing or bathing?: No Independently performs ADLs?: Yes (appropriate for developmental age) Does the patient have difficulty walking or  climbing stairs?: No Weakness of Legs: None Weakness of Arms/Hands: None  Permission Sought/Granted                  Emotional Assessment Appearance:: Appears older than stated age Attitude/Demeanor/Rapport: Guarded Affect (typically observed): Flat, Guarded Orientation: : Oriented to Self, Oriented to Place, Oriented to  Time   Psych Involvement: No (comment)  Admission diagnosis:  Aphasia [R47.01] Acute ischemic stroke [facility] [I63.9] Patient Active Problem List   Diagnosis Date Noted  . AIDS (acquired immune deficiency syndrome) (HCC) 12/25/2018  . Carotid artery occlusion 12/25/2018  . CVA (cerebral vascular accident) (HCC) 12/25/2018  . TIA (transient ischemic attack) 12/06/2018  . ICH (intracerebral hemorrhage) (HCC) 05/23/2014  . Diverticulitis of colon 02/24/2013  . Pancreatitis, Hx of 02/24/2013  . Alcohol abuse 02/24/2013   PCP:  Default, Provider, MD Pharmacy:   [facility] - [place], [place] - [address] [place] [address] Phone: [phone] Fax: [phone]  [facility] - [place], [place] - [address] [place] [address] Phone: [phone] Fax: [phone]  [facility].    Social Determinants of Health (SDOH) Interventions  Pt admits to not taking medications appropriately. Sister provides transportation to appointments.  Readmission Risk Interventions 30 Day Unplanned Readmission Risk Score     ED to Hosp-Admission (Current) from 12/24/2018 in [place] 3W [facility]  30 Day Unplanned Readmission Risk Score (%)  11 Filed at 12/26/2018 1200  This score is the patient's risk of an unplanned readmission within 30 days of being discharged (0 -100%). The score is based on dignosis, age, lab data, medications, orders, and past utilization.   Low:  0-14.9   Medium: 15-21.9   High: 22-29.9   Extreme: 30 and above        No flowsheet data found.

## 2018-12-26 NOTE — Discharge Summary (Signed)
Name: [name] MRN: [id number] DOB: 04-Dec-1955 63 y.o. PCP: Default, Provider, MD  Date of Admission: 12/24/2018 10:31 AM Date of Discharge: 01/01/2019 Attending Physician: Anne Shutter, MD  Discharge Diagnosis: 1. Principal Problem:   TIA (transient ischemic attack) Active Problems:   AIDS (acquired immune deficiency syndrome) (HCC)   Carotid artery occlusion   CVA (cerebral vascular accident) (HCC)   Dementia (HCC)   Discharge Medications: Allergies as of 01/01/2019   No Known Allergies     Medication List    STOP taking these medications   amLODipine 5 MG tablet Commonly known as:  NORVASC   hydrochlorothiazide 12.5 MG tablet Commonly known as:  HYDRODIURIL   pravastatin 20 MG tablet Commonly known as:  PRAVACHOL     TAKE these medications   aspirin 81 MG EC tablet Take 1 tablet (81 mg total) by mouth daily for 21 days.   bictegravir-emtricitabine-tenofovir AF 50-200-25 MG Tabs tablet Commonly known as:  Biktarvy Take 1 tablet by mouth daily for 30 days.   clopidogrel 75 MG tablet Commonly known as:  PLAVIX Take 1 tablet (75 mg total) by mouth daily.   sulfamethoxazole-trimethoprim 800-160 MG tablet Commonly known as:  BACTRIM DS,SEPTRA DS Take 1 tablet by mouth daily.       Disposition and follow-up:   Lindsey Leon was discharged from [facility] in Stable condition.  At the hospital follow up visit please address:  1.  Patient presented with TIA (expressive aphasia. Resolved on second day of admission). She had recent admission due to TIA, was not compliant to ASA and Plavix due to dementia. Is discharged with ASA +Plavix for 21 days then only Plavix.(No Statin per neuro since her LDL is very low at 26).  2. BP medications held for permissive HTN. She has had nl/soft BP (SBP 100s-130s) before discharge without anti HTN meds. Please evaluate her BP at f/u to adjust her meds. (prevent both low and high BP due to  Acute on chronic carotid artery stenosis that prgressed to occlusion).   3. Please make sure she follows up with neurology and vascular surgery for acute on chronic occlusion of right common carotid artery.   4. Patient with new diagnoses of HIV.  ID started her on Bictarvy and Bactrim on this admission. Also + HCV. Please ensure she is compliant with medications and f/u with ID.  2.  Labs / imaging needed at time of follow-up: CBC, BMP  3.  Pending labs/ test needing follow-up: None  Follow-up Appointments: Follow-up Information    [name], MD. Schedule an appointment as soon as possible for a visit in 4 week(s).   Specialties:  Neurology, Radiology Contact information: [address] [place] [address] [phone]        Veryl Speak, FNP Follow up.   Specialties:  Family Medicine, Infectious Diseases Why:  01/06/2019 at 11am. If you are not able to make this appointment please call our office to reschedule.  Contact information: [address] [place] [address] [phone]           Hospital Course by problem list: 1. TIA 63 year old female withpertinentpast medical history of stroke, intracranialhemorrhage2015, hypertension, newly diagnosed HIV,dementia presented with expressive aphasia.Head CT scan and MRI w/o evidence of stroke. (She had a recent admission for TIA . With negative stroke work up and discharged with ASA and Plavix. How ever she is not sure if she took them every  day.) On this admission, aspirin and Plavix. Brain MRI was without evidence of a stroke. TIA symptoms resolved and patient remained asymptomatic.  Neurologic exam remained at baseline and without aphasia or focal neurological deficit. Patient discharged with dual antiplatelet therapy with aspirin and Plavix for 3 weeks then Plavix only. (No Statin due to low LDL at 26). Advised to f/u with neurology outpatient.  2. Right CCA and ICA tandem  high-grade stenosis, progressed to R CCA occlusion: CTA at this admission, showed progression of prior right CCA stenosis to occlusion.  (Previous carotid Doppler 05/2014 showed right ICA 60 to 79% stenosis and 11/2018 right CCA and ICA tandem critical stenosis confirmed on CTA) Neurology recommended continuing aspirin and Plavix and outpatient referral to vascular surgery for follow-up. (Did not think this is the cause of patient's neurologic presentation, also patient was not completely compliant to dual anti plt therapy since last discharge due to dementia. No intervention recommended during this hospitalization.)   Dementia: [name] lives alone and has moderated dementia. Patient is aware of her stroke and HIV, can name her medications and knows the cause of being in hospital and the reason she should take her medication. How ever she mentions that she is not sure that she has taken all of her meds after last discharge and she might have forgoten. seems to have the capacity of decision making but she is not safe to live alone after PT/OT and cognition evaluation. She was Her sister was contacted but mentioned that she can not take the patient home with her.  We discussed that she is not safe to live alone and patient and sister agreed that she goes to SNF after discharge.  Hypertension:BP is on arrival was at 180s. Initially held home meds (Amlodipine and HCTZ) for permissive hypertension. Her BP at 107/70 on discharge without anti HTN medications. (BP goal: Normotensive or higher considering Carotid occlusion)  Newly diagnosed HIV: Diagnosed via screen test on last admission Not on any medication before this admission. CD4 50 on this Admission. ID consulted during this admission and started Biktarvy and Bactrim for OI. we appreciate recommendation--> Started Biktarvy and Bactrim. Patient also receoived pneumococcal vaccine Hep C Ab is also positive with elevated HCV RNA quant. Patient will follow  up at ID clinic after discharge.   Discharge Vitals:   BP (!) 99/58 (BP Location: Left Arm)   Pulse 77   Temp 98.2 F (36.8 C) (Oral)   Resp 18   Ht  (1.676 m)   Wt 54.1 kg   SpO2 100%   BMI 19.25 kg/m   Pertinent Labs, Studies, and Procedures:  Brain MRI: 12/24/2018 Chronic changes similar to prior MR 12/06/2018. An acute infarct is not identified.  Atrophy and small vessel disease, multiple old lacunar infarcts, as well as a chronic LEFT temporal hemorrhage with encephalomalacia  CTA 12/24/2018  1. Negative for emergent large vessel occlusion. There is right M3 branch stenosis. There is a small area of delayed perfusion right parietal white matter 5 mL volume. 2. Acute occlusion right common carotid artery. This was patent on recent CTA of 12/06/2018. Severe atherosclerotic disease right carotid bifurcation. Right internal carotid artery is patent from external to internal artery chronic collaterals however there is markedly diminished flow in the right internal carotid artery. There is extensive disease in the cavernous carotid bilaterally. 3. 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 Moderate stenosis of the posterior cerebral artery bilaterally. 4. 12 mm sub  solid nodule right lung apex unchanged from prior CT. Initial follow-up by chest CT without contrast is recommended in 3 months to confirm persistence. This recommendation follows the consensus statement: Recommendations for the Management of Subsolid Pulmonary Nodules Detected at CT: A Statement from the Fleischner Society as published in Radiology 2013; 266:304-317. 5. These results were called by telephone at the time of interpretation on 12/24/2018 at 11:56 am to Dr. Amada Jupiter , who verbally acknowledged these results.  CT head 3/10 1. No acute intracranial abnormality 2. Advanced atrophy and extensive chronic ischemic change.   Discharge Instructions:  Discharge Instructions    Ambulatory referral to Neurology   Complete by:  As directed    Follow up with Dr. Pearlean Brownie at [facility] in 4-6 weeks. Too complicated for RN to follow. Thanks.   Ambulatory referral to Vascular Surgery   Complete by:  As directed    63 y/o female, with 2 admission for TIA (With expressive aphasia second time)  Right CCA and ICA tandem high-grade stenosis. 05/2014 carotid Doppler showed right ICA 60 to 79% stenosis 11/2018 right CCA and ICA tandem critical stenosis confirmed on CTAStill asymptomatic at this time Neurology recommend outpatient referral to vascular surgery for follow up.  Thank you.   Call MD for:   Complete by:  As directed    Visual changes, slurred speech, weakness or numbness or dizziness.   Diet - low sodium heart healthy   Complete by:  As directed    Diet - low sodium heart healthy   Complete by:  As directed    Discharge instructions   Complete by:  As directed    Thank you for allowing [place] taking care of you at [facility]. We are glad that you are doing better. Please take all of your medications as instructed for you. Take aspirin and plavix for 3 weeks. Then you stop Aspirin and continue with Plavix. You I hold one of your medications. You also should take Pravastatin every day as well as your HIV medication: Biktarvy and Bactrim (to prevent infection) every day and follow up with infectious disease. I hold one of your blood pressure medication (Hydrochlorothiazid) for now until you see your primary care doctor in a week. As discussed with you, we placed order to send a nurse to your home to assist you with taking your medications. Make sure to follow up in our clinic as scheduled for you and also with neurology and infectious disease clinic.  If you have any question or concern, you can call us at [phone]. Thanks   Discharge instructions   Complete by:  As directed    Thank you for allowing [place] taking care of you at [facility].  You came in due to problems speaking, concerning for stroke.  We are glad that you are getting better we are glad that you are doing better now.  We discharge you to an nursing facility for rehab.  Please take all of your medications as instructed and as will be given to you there.  Please send referral to vascular surgery, neurology, infectious disease and primary care.  Please follow-up. If you have any question or concern, he can call us at [phone]. Thank you   Face-to-face encounter (required for Medicare/Medicaid patients)   Complete by:  As directed    I Laressa Bolinger certify that this patient is under my care and that I, or a nurse practitioner or physician's assistant working with me, had a face-to-face  encounter that meets the physician face-to-face encounter requirements with this patient on 12/27/2018. The encounter with the patient was in whole, or in part for the following medical condition(s) which is the primary reason for home health care (List medical condition): Patient with dementia and needs help with pill box and taking her medication.   The encounter with the patient was in whole, or in part, for the following medical condition, which is the primary reason for home health care:  Patient with dementia and needs to get assistance for taking her medications   I certify that, based on my findings, the following services are medically necessary home health services:   Nursing Physical therapy Speech language pathology     Reason for Medically Necessary Home Health Services:  Other See Comments   My clinical findings support the need for the above services:  Cognitive impairments, dementia, or mental confusion  that make it unsafe to leave home   Further, I certify that my clinical findings support that this patient is homebound due to:  Mental confusion   Home Health   Complete by:  As directed    Patient needs assistant for pill box and taking medications  regularly . She has dementia. She is alert and oriented to her medical situation but sometimes forget her medications due to dementia and PT/OT at home after TIA   To provide the following care/treatments:   PT OT RN Home Health Aide     Increase activity slowly   Complete by:  As directed    Increase activity slowly   Complete by:  As directed       Signed: Chevis Pretty, MD 01/01/2019, 11:07 AM   Pager: [phone]

## 2018-12-26 NOTE — Progress Notes (Signed)
CM spoke to pts sister: Rosey Bath on the phone. She is not able to take her home with her and doesn't know anyone that can assist or stay with her sister. CM encouraged her to find an ALF when pts medicaid starts.  Per MD note pt is not safe at home. CM paged MD to discuss above but no call back. CM and CSW will discuss pt with CSW director in am.

## 2018-12-27 DIAGNOSIS — B1920 Unspecified viral hepatitis C without hepatic coma: Secondary | ICD-10-CM

## 2018-12-27 DIAGNOSIS — K921 Melena: Secondary | ICD-10-CM

## 2018-12-27 DIAGNOSIS — F0390 Unspecified dementia without behavioral disturbance: Secondary | ICD-10-CM

## 2018-12-27 DIAGNOSIS — R636 Underweight: Secondary | ICD-10-CM

## 2018-12-27 LAB — HCV RNA QUANT

## 2018-12-27 LAB — CBC
HCT: 30.9 % — ABNORMAL LOW (ref 36.0–46.0)
Hemoglobin: 10.7 g/dL — ABNORMAL LOW (ref 12.0–15.0)
MCH: 32.3 pg (ref 26.0–34.0)
MCHC: 34.6 g/dL (ref 30.0–36.0)
MCV: 93.4 fL (ref 80.0–100.0)
Platelets: 110 10*3/uL — ABNORMAL LOW (ref 150–400)
RBC: 3.31 MIL/uL — ABNORMAL LOW (ref 3.87–5.11)
RDW: 13.5 % (ref 11.5–15.5)
WBC: 2.3 10*3/uL — ABNORMAL LOW (ref 4.0–10.5)
nRBC: 0 % (ref 0.0–0.2)

## 2018-12-27 LAB — HCV RNA (INTERNATIONAL UNITS)
HCV LOG10: 7.279 {Log_IU}/mL
HCV RNA (International Units): 19000000 IU/mL

## 2018-12-27 MED ORDER — AMLODIPINE BESYLATE 5 MG PO TABS
5.0000 mg | ORAL_TABLET | Freq: Every day | ORAL | Status: DC
  Administered 2018-12-27: 5 mg via ORAL
  Filled 2018-12-27: qty 1

## 2018-12-27 MED ORDER — ASPIRIN 81 MG PO TBEC: 81.0000 mg | DELAYED_RELEASE_TABLET | Freq: Every day | ORAL | 0 refills | Status: DC

## 2018-12-27 MED FILL — ASPIRIN LOW DOSE 81 MG TBEC: 81 | 21 days supply | Qty: 21 | Fill #0

## 2018-12-27 NOTE — Progress Notes (Signed)
Occupational Therapy Treatment Patient Details Name: Lindsey Leon MRN: [id number] DOB: Aug 01, 1956 Today's Date: 12/27/2018    History of present illness 63 y.o. 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    OT comments  Pt demonstrates cognitive deficits with Pill box test that confirms inability to complete medication management at this time. Pt easily distracted during task and unable to correct errors. Pt agreeable to follow up rehab and (A) with medications.    Follow Up Recommendations  SNF;Supervision/Assistance - 24 hour    Equipment Recommendations  None recommended by OT    Recommendations for Other Services      Precautions / Restrictions Precautions Precautions: Fall Restrictions Weight Bearing Restrictions: No       Mobility Bed Mobility Overal bed mobility: Modified Independent                Transfers                      Balance                                           ADL either performed or assessed with clinical judgement   ADL                                               Vision       Perception     Praxis      Cognition Arousal/Alertness: Awake/alert Behavior During Therapy: WFL for tasks assessed/performed Overall Cognitive Status: Impaired/Different from baseline                                 General Comments: Pt completed Pill box test - The patient placed a medication that was described as 1 time per day 13 times in the pill box for one day. pt omitted medication for 3 days in a weeks pill box. Pt with no awareness to the errors. pt agreeable to (A) with medication management after education of this  task.         Exercises     Shoulder Instructions       General Comments      Pertinent Vitals/ Pain       Pain Assessment: No/denies pain  Home Living                                          Prior Functioning/Environment              Frequency  Min 2X/week        Progress Toward Goals  OT Goals(current goals can now be found in the care plan section)  Progress towards OT goals: Progressing toward goals  Acute Rehab OT Goals Patient Stated Goal: "go back home" OT Goal Formulation: Patient unable to participate in goal setting Time For Goal Achievement: 01/08/19  ADL Goals Additional ADL Goal #1: Pt will consistently demonstrate how to call 911 and give her correct address 3/3 trials Additional ADL Goal #2: Pt will demonstrate the ability to accurately manage her weekly medicaiton schedule wihtout vc on 3/3 trials.  Plan Discharge plan remains appropriate    Co-evaluation                 AM-PAC OT "6 Clicks" Daily Activity     Outcome Measure   Help from another person eating meals?: None Help from another person taking care of personal grooming?: A Little Help from another person toileting, which includes using toliet, bedpan, or urinal?: None Help from another person bathing (including washing, rinsing, drying)?: None Help from another person to put on and taking off regular upper body clothing?: None Help from another person to put on and taking off regular lower body clothing?: A Little 6 Click Score: 22    End of Session    OT Visit Diagnosis: Unsteadiness on feet (R26.81);Muscle weakness (generalized) (M62.81);Other symptoms and signs involving cognitive function   Activity Tolerance Patient tolerated treatment well   Patient Left in bed;with call bell/phone within reach   Nurse Communication Mobility status;Precautions        Time: 1450-1520 OT Time Calculation (min): 30 min  Charges: OT General Charges $OT  Visit: 1 Visit OT Treatments $Cognitive Funtion inital: Initial 15 mins $Cognitive Funtion additional: Additional15 mins   Mateo Flow, OTR/L  Acute Rehabilitation Services Pager: [phone] Office: [phone] .    Mateo Flow 12/27/2018, 3:36 PM

## 2018-12-27 NOTE — NC FL2 (Signed)
[place] MEDICAID FL2 LEVEL OF CARE SCREENING TOOL     IDENTIFICATION  Patient Name: Lindsey Leon Birthdate: [birth year] Sex: female Admission Date (Current Location): 12/24/2018  [facility] and [place] Number:  Producer, television/film/video and Address:  [facility] [place], [address] [place], [place] [address]      Provider Number: [id number]  Attending Physician Name and Address:  Tyson Alias, *  Relative Name and Phone Number:       Current Level of Care: Hospital Recommended Level of Care: Skilled Nursing Facility Prior Approval Number:    Date Approved/Denied:   PASRR Number: [id number]  Discharge Plan: SNF    Current Diagnoses: Patient Active Problem List   Diagnosis Date Noted  . AIDS (acquired immune deficiency syndrome) (HCC) 12/25/2018  . Carotid artery occlusion 12/25/2018  . CVA (cerebral vascular accident) (HCC) 12/25/2018  . TIA (transient ischemic attack) 12/06/2018  . ICH (intracerebral hemorrhage) (HCC) 05/23/2014  . Diverticulitis of colon 02/24/2013  . Pancreatitis, Hx of 02/24/2013  . Alcohol abuse 02/24/2013    Orientation RESPIRATION BLADDER Height & Weight     Self, Time, Situation, Place  Normal Continent Weight: 119 lb 4.3 oz (54.1 kg) Height:  5\' 6"  (167.6 cm)  BEHAVIORAL SYMPTOMS/MOOD NEUROLOGICAL BOWEL NUTRITION STATUS      Continent Diet(see DC summary)  AMBULATORY STATUS COMMUNICATION OF NEEDS Skin   Limited Assist Verbally Normal                       Personal Care Assistance Level of Assistance  Bathing, Feeding, Dressing Bathing Assistance: Limited assistance Feeding assistance: Limited assistance Dressing Assistance: Limited assistance     Functional Limitations Info  Sight, Hearing, Speech Sight Info: Adequate Hearing Info: Adequate Speech Info: Adequate    SPECIAL CARE FACTORS FREQUENCY  PT (By licensed PT), OT (By licensed OT)     PT Frequency: 2x/wk OT Frequency: 2x/wk             Contractures Contractures Info: Not present    Additional Factors Info  Code Status, Allergies Code Status Info: Full Allergies Info: NKA           Current Medications (12/27/2018):  This is the current hospital active medication list Current Facility-Administered Medications  Medication Dose Route Frequency Provider Last Rate Last Dose  . acetaminophen (TYLENOL) tablet 650 mg  650 mg Oral Q4H PRN Reymundo Poll, MD       Or  . acetaminophen (TYLENOL) solution 650 mg  650 mg Per Tube Q4H PRN Reymundo Poll, MD       Or  . acetaminophen (TYLENOL) suppository 650 mg  650 mg Rectal Q4H PRN Reymundo Poll, MD      . aspirin EC tablet 81 mg  81 mg Oral Daily Reymundo Poll, MD   81 mg at 12/27/18 1033  . bictegravir-emtricitabine-tenofovir AF (BIKTARVY) 50-200-25 MG per tablet 1 tablet  1 tablet Oral Daily Veryl Speak, FNP   1 tablet at 12/27/18 1020  . clopidogrel (PLAVIX) tablet 75 mg  75 mg Oral Daily Reymundo Poll, MD   75 mg at 12/27/18 1034  . pneumococcal 23 valent vaccine (PNU-IMMUNE) injection 0.5 mL  0.5 mL Intramuscular Tomorrow-1000 Tyson Alias, MD      . senna-docusate (Senokot-S) tablet 1 tablet  1 tablet Oral QHS PRN Reymundo Poll, MD      . sulfamethoxazole-trimethoprim (BACTRIM DS,SEPTRA DS) 800-160 MG per tablet 1 tablet  1 tablet  Oral Daily Veryl Speak, FNP   1 tablet at 12/27/18 1033     Discharge Medications: Please see discharge summary for a list of discharge medications.  Relevant Imaging Results:  Relevant Lab Results:   Additional Information SS#: [id number]; 30 day LOG, will need long term placement  Baldemar Lenis, LCSW

## 2018-12-27 NOTE — Progress Notes (Signed)
Physical Therapy Treatment Patient Details Name: Lindsey Leon MRN: [id number] DOB: [birth year] Today's Date: 12/27/2018    History of Present Illness 63 y.o. 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     PT Comments    Patient progressing slowly towards PT goals. Significant cognitive deficits noted during session. Upon entering room, pt reports she needs to order breakfast and has no idea how to attempt this task despite cues. Pt with poor awareness of deficits/safety, impaired memory and problem solving. Difficulty dual tasking during ambulation making balance worse. When asked what to do in an emergency, pt stated "holler out the window," but able to get to call 911 with cues. Slow to respond to questions, if at all at times. Pt NOT SAFE to be home alone. Sister not able to provide much assist, so recommendation updated to SNF. Will follow.   Follow Up Recommendations  SNF;Supervision/Assistance - 24 hour     Equipment Recommendations  None recommended by PT    Recommendations for Other Services       Precautions / Restrictions Precautions Precautions: Fall Restrictions Weight Bearing Restrictions: No    Mobility  Bed Mobility Overal bed mobility: Modified Independent                Transfers Overall transfer level: Needs assistance Equipment used: None Transfers: Sit to/from Stand Sit to Stand: Supervision         General transfer comment: for safety  Ambulation/Gait Ambulation/Gait assistance: Min guard Gait Distance (Feet): 140 Feet Assistive device: None Gait Pattern/deviations: Step-through pattern;Decreased stride length;Drifts right/left Gait velocity: decreased   General Gait Details:  Slow, mildly unsteady gait with multiple standing rest breaks; Wide BoS and instability noted but no overt LOB. Difficulty remembering room number; once told she has to find her room, does a complete 180 to try to retrace steps when it was the longer route and pt already fatigued.    Stairs             Wheelchair Mobility    Modified Rankin (Stroke Patients Only) Modified Rankin (Stroke Patients Only) Pre-Morbid Rankin Score: Moderate disability Modified Rankin: Moderately severe disability     Balance Overall balance assessment: Needs assistance Sitting-balance support: Feet supported;No upper extremity supported Sitting balance-Leahy Scale: Good     Standing balance support: During functional activity Standing balance-Leahy Scale: Fair                              Cognition Arousal/Alertness: Awake/alert Behavior During Therapy: WFL for tasks assessed/performed Overall Cognitive Status: Impaired/Different from baseline Area of Impairment: Memory;Following commands;Safety/judgement;Awareness;Problem solving                     Memory: Decreased short-term memory Following Commands: Follows one step commands consistently Safety/Judgement: Decreased awareness of safety;Decreased awareness of deficits Awareness: Emergent Problem Solving: Slow processing;Requires verbal cues General Comments: A&Ox3; able to state she had a mini stroke, "I am not taking my meds correctly." Upon entering room, pt not able to use phone to call down to order breakfast. Difficulty finding and using the mute button on the remote without cues. DOes not recall room number in hallway, needs cues to navigate.  Exercises      General Comments General comments (skin integrity, edema, etc.): VSS throughout      Pertinent Vitals/Pain Pain Assessment: No/denies pain    Home Living                      Prior Function            PT Goals (current goals can  now be found in the care plan section) Progress towards PT goals: Progressing toward goals(slowly)    Frequency    Min 3X/week      PT Plan Discharge plan needs to be updated    Co-evaluation              AM-PAC PT "6 Clicks" Mobility   Outcome Measure  Help needed turning from your back to your side while in a flat bed without using bedrails?: None Help needed moving from lying on your back to sitting on the side of a flat bed without using bedrails?: None Help needed moving to and from a bed to a chair (including a wheelchair)?: A Little Help needed standing up from a chair using your arms (e.g., wheelchair or bedside chair)?: A Little Help needed to walk in hospital room?: A Little Help needed climbing 3-5 steps with a railing? : A Little 6 Click Score: 20    End of Session Equipment Utilized During Treatment: Gait belt Activity Tolerance: Patient tolerated treatment well Patient left: in bed;with call bell/phone within reach;with bed alarm set;Other (comment)(SLP in room) Nurse Communication: Mobility status PT Visit Diagnosis: Other abnormalities of gait and mobility (R26.89)     Time: 5830-9407 PT Time Calculation (min) (ACUTE ONLY): 19 min  Charges:  $Gait Training: 8-22 mins                     Mylo Red, PT, DPT Acute Rehabilitation Services Pager [phone] Office [phone]       Blake Divine A Lanier Ensign 12/27/2018, 9:34 AM

## 2018-12-27 NOTE — Progress Notes (Addendum)
   Subjective: Patient was seen and evaluated at bedside on morning rounds.Had some blood with nowel movement last night. But it resolved. No abdominap pain, no N/V.  No acute events overnight. No complaint. She says she only needs to order breakfast and has no medical complaints. We discussed her situation and that she might not be safe to live alone at home. She is not interested in going to SNF but mentions that she does not have another option.  She has a good understanding about her medical situation and agrees that she need to take medications to prevent " getting sick and coming back to the hospital". She is willing to take her medications.  Objective:  Vital signs in last 24 hours: Vitals:   12/27/18 0013 12/27/18 0047 12/27/18 0056 12/27/18 0308  BP: (!) 151/59 (!) 168/70 (!) 168/70 (!) 165/74  Pulse: 65 65 66 71  Resp: 18 16 16 17   Temp: 97.9 F (36.6 C) 98.1 F (36.7 C) 98.1 F (36.7 C) 98.3 F (36.8 C)  TempSrc: Oral Oral Oral Oral  SpO2: 98% 100% 100% 100%  Weight:      Height:       Physical exam:  Vital signs reviewed General: Pleasant lady, sitting on the bed in no acute distress  Neurologic exam: She is alert and oriented at baseline.  Speaks well.  No focal neurologic deficit. She moves all of her extremities. Extremities: No edema  Assessment/Plan:  Principal Problem:   TIA (transient ischemic attack) Active Problems:   AIDS (acquired immune deficiency syndrome) (HCC)   Carotid artery occlusion   CVA (cerebral vascular accident) (HCC)  63 year old female withpertinentpast medical history of strokew/o deficit, intracranialhemorrhage2015, hypertension, HIV,presented with expressive aphasia. CTAshowedright common carotid occlusionbutunclear how acute it is. Brain MRIwithout evidence of stroke.  TIA and right common carotid occlusion. Her symptoms resolved. And she is at her baseline currently.  PT recommended SNF/24-hour assistance.  Social worker talked to patient's sister but she said that she can not take the patient home. We discussed patient preference wishes about her dispo. She finally agreed to [facility] to [facility]ares that" She has been in hospital for 4 days, because she had a small stroke. She knows that she recently diagnosed with HIV ad should takes her medications and she does not do so, she "will get sick". She does not like to go to SNF but mentions that she understands she does not have other choice.   -Medically ready to discharge to SNF. SW is following.   -Continue ASA, Plavix for 3 weeks then only Plavix  -Continue pravastatin  -Resuming Amlodipine 5 mg QD(holding HCTZ for now)  Hypertension:BP is 165/74 BP goal higher having carotid occlusion -Resumeamlodipine and hold HCTZ   Hematochezia: Patient had 1 episode of blood with bowel movement. Has been asymptomatic otherwise. Has Hx of hemorrhoid.  No further bleeling. Hb stable. this AM: 10.7  -Will monitor -Senokot PRN   Newly diagnosed HIV Treatment started on this admission. CD4 50. ID started Bactrim for OI. Hep C Ab is also positive. HCV RNA pending.  -Continue Biktarvy and Bactrim -Follow-up with infectious disease after discharge (plan by ID) -HCV RNA quant pending   Dispo: Discharge pending until SNF placement   Chevis Pretty, MD 12/27/2018, 6:09 AM Pager: [phone]

## 2018-12-27 NOTE — Progress Notes (Signed)
  Speech Language Pathology Treatment: Cognitive-Linquistic  Patient Details Name: Lindsey Leon MRN: [id number] DOB: 08-Oct-1956 Today's Date: 12/27/2018 Time: 7253-6644 SLP Time Calculation (min) (ACUTE ONLY): 3 min  Assessment / Plan / Recommendation Clinical Impression  Pt continues to demonstrates cognitive impairment significant enough to impact success with even the most basic cognitive linguistic tasks. Working memory is so severely impaired that she is unable at times to appropriate participate in basic conversation, or problem solve simple tasks. Evidenced by need for max assist to count money coins, use call bell, complete meal. She can add a 10 and a twenty, but then after several second delay cannot recall the sum. When asked why she has been to the hospital so often she replied, " I think im not taking my medicine right." She was able to recall that she takes two pills, aspirin and Plavix, but has not taken any meds for HIV at home. She states she doesn't ever remember if she has taken them and most of the time she probably doesn't take them. When asked if she has a strategy for organizing pills she says she has a pill box but she doesn't use it. She also isn't sure if she eats breakfast or dinner most days, but she eats lunch  In her apartments cafeteria.   This pt needs full supervision with medication at home and needs a reliable source of food preparation. There is no expectation that her cognition will improve, assist is required.    HPI HPI: Pt is 63 year old woman with a history of TIA, essential hypertension, prior alcohol use disorder and recently diagnosed with HIV-1 who presents with the sudden onset of right hand weakness and difficulty finding words. She was determined to have transient aphasia. MRI of the brain was negative for any acute findings.      SLP Plan  Continue with current plan of care       Recommendations                   Follow up  Recommendations: 24 hour supervision/assistance;Other (comment)(ALF) SLP Visit Diagnosis: Cognitive communication deficit (I34.742) Plan: Continue with current plan of care       GO                Josh Nicolosi, Riley Nearing 12/27/2018, 12:27 PM

## 2018-12-28 MED ORDER — AMLODIPINE BESYLATE 5 MG PO TABS
5.0000 mg | ORAL_TABLET | Freq: Every day | ORAL | Status: DC
  Administered 2018-12-28 – 2018-12-29 (×2): 5 mg via ORAL
  Filled 2018-12-28 (×3): qty 1

## 2018-12-28 NOTE — Progress Notes (Signed)
   Subjective: Patient was seen and evaluated at bedside this AM. No acute events overnight. She is doing well and has  No complaint.   Objective:  Vital signs in last 24 hours: Vitals:   12/27/18 2012 12/27/18 2343 12/28/18 0350 12/28/18 0729  BP: 131/62 (!) 153/55 125/70 135/70  Pulse: 69 67 66 69  Resp: 17 18 16 18   Temp: 98.4 F (36.9 C) 98 F (36.7 C) 97.8 F (36.6 C) 98.1 F (36.7 C)  TempSrc: Oral Oral Oral Oral  SpO2:  100% 100% 100%  Weight:      Height:       Vital signs reviewed, nursing notes reviewed General:  No acute distress CV: RRR, systolic murmur Lungs: CTA bilaterally Extremities: No lower extremity edema Neurologic exam: Alert and oriented x3. Cranial nerves I to XII are intact.  No motor deficit  Assessment/Plan:  Principal Problem:   TIA (transient ischemic attack) Active Problems:   AIDS (acquired immune deficiency syndrome) (HCC)   Carotid artery occlusion   CVA (cerebral vascular accident) (HCC)   Dementia (HCC)   63 year old female withpertinentpast medical history of strokew/o deficit, intracranialhemorrhage2015, hypertension, HIV,presented with expressive aphasia. CTAshowedright common carotid occlusionbutunclear how acute it is. Brain MRIwithout evidence of stroke.  TIA andright common carotid occlusion. Her symptoms resolved. And she is at her baseline currently and remained asymptomatic -Medically ready to discharge to SNF. SW is following. -Continue ASA, Plavixfor 3 weeks then only Plavix  -Continue pravastatin   Dementia: Moderate with some cognitive impairment. How ever she is oriented x4 and is aware of her medical condition, her stroke medications name and importance of compliance. She seems not safe to live alone but showes capacity of making decision. She agreed to go to nursing facility after discharge.  - Appreciate social worker follow up for SNF placement.  Hypertension:BP is 125/70 -->135/75 BP  goal higherhaving carotid occlusion. So if gets hypotensive, will hold Amlodipine again.  - Continue amlodipine 5 mg QD. HoldingHCTZ - Will f/u in clinic after DC.   Newly diagnosed HIV  -Continue Biktarvy and Bactrim -Follow-up with infectious disease after discharge -HCV RNA quantpending   Dispo: Discharge pending until SNF placement   Chevis Pretty, MD 12/28/2018, 11:04 AM Pager: [phone]

## 2018-12-29 DIAGNOSIS — I352 Nonrheumatic aortic (valve) stenosis with insufficiency: Secondary | ICD-10-CM

## 2018-12-29 DIAGNOSIS — E876 Hypokalemia: Secondary | ICD-10-CM

## 2018-12-29 LAB — BASIC METABOLIC PANEL
ANION GAP: 8 (ref 5–15)
BUN: 8 mg/dL (ref 8–23)
CO2: 22 mmol/L (ref 22–32)
Calcium: 9 mg/dL (ref 8.9–10.3)
Chloride: 109 mmol/L (ref 98–111)
Creatinine, Ser: 0.78 mg/dL (ref 0.44–1.00)
GFR calc Af Amer: >60 mL/min (ref >60)
GFR calc non Af Amer: >60 mL/min (ref >60)
Glucose, Bld: 117 mg/dL — ABNORMAL HIGH (ref 70–99)
POTASSIUM: 3.1 mmol/L — AB (ref 3.5–5.1)
Sodium: 139 mmol/L (ref 135–145)

## 2018-12-29 MED ORDER — POTASSIUM CHLORIDE CRYS ER 20 MEQ PO TBCR
40.0000 meq | EXTENDED_RELEASE_TABLET | Freq: Two times a day (BID) | ORAL | Status: DC
  Administered 2018-12-29: 40 meq via ORAL
  Filled 2018-12-29: qty 2

## 2018-12-29 MED ORDER — POTASSIUM CHLORIDE CRYS ER 20 MEQ PO TBCR
40.0000 meq | EXTENDED_RELEASE_TABLET | Freq: Two times a day (BID) | ORAL | Status: AC
  Administered 2018-12-29: 40 meq via ORAL
  Filled 2018-12-29: qty 2

## 2018-12-29 NOTE — Plan of Care (Signed)
  Problem: Clinical Measurements: Goal: Ability to maintain clinical measurements within normal limits will improve Outcome: Progressing   Problem: Safety: Goal: Ability to remain free from injury will improve Outcome: Progressing   

## 2018-12-29 NOTE — Progress Notes (Addendum)
   Subjective: Patient was seen and evaluated at bedside on morning rounds. No acute events overnight. She is doing ok. No acute complaints. Denies any speech problem, numbness or weakness.  Objective:  Vital signs in last 24 hours: Vitals:   12/29/18 0340 12/29/18 0737 12/29/18 1201 12/29/18 1545  BP: 135/61 127/68 132/60 118/60  Pulse: 64 69 66 71  Resp: 16 16 16 15   Temp: 98.2 F (36.8 C) (!) 97.5 F (36.4 C) 97.8 F (36.6 C) 98.2 F (36.8 C)  TempSrc: Oral Oral Oral Oral  SpO2: 100% 100% 100% 100%  Weight:      Height:       Physical Exam HENT:     Head: Normocephalic and atraumatic.  Eyes:     Extraocular Movements: Extraocular movements intact.  Cardiovascular:     Rate and Rhythm: Normal rate.     Heart sounds: Murmur present.  Pulmonary:     Effort: Pulmonary effort is normal.     Breath sounds: Normal breath sounds. No wheezing or rales.  Abdominal:     Palpations: Abdomen is soft.     Tenderness: There is no abdominal tenderness.  Musculoskeletal:     Right lower leg: No edema.     Left lower leg: No edema.  Neurological:     General: No focal deficit present.     Mental Status: She is alert.     Sensory: No sensory deficit.     Motor: No weakness.     Assessment/Plan:  Principal Problem:   TIA (transient ischemic attack) Active Problems:   AIDS (acquired immune deficiency syndrome) (HCC)   Carotid artery occlusion   CVA (cerebral vascular accident) (HCC)   Dementia (HCC)  63 year old female withpertinentpast medical history of strokew/o deficit, intracranialhemorrhage2015, hypertension, HIV,presented with expressive aphasia. CTAshowedright common carotid occlusionbutunclear how acute it is. Brain MRIwithout evidence of stroke.  TIA andright common carotid occlusion. No new clinical update. She has been at her baselineand remained asymptomatic. -Medically ready to dischargeto SNF. SW is following. -Continue ASA, Plavixfor  3 weeks then only Plavix  -Continue pravastatin  Hypokalemia: K 3.1 today. replacing and will recheck tomorrow. Will check Mg if still low. -K dur 40 meq po x 2 -BMP tomorrow  Dementia: Moderate with some cognitive impairment.  But with showes capacity of making decision. She agreed to go to nursing facility after discharge.  - Appreciate social worker follow up for SNF placement.  Hypertension:BP is 125/70 -->135/75 BP goal higherhaving carotid occlusion. So if gets hypotensive, will hold Amlodipine again.  - Continue amlodipine 5 mg QD. HoldingHCTZ - Will f/u in clinic after DC.  Newly diagnosed HIV  -Continue Biktarvy and Bactrim -Follow-up with infectious disease after discharge   Dispo:Discharge pending untilSNFplacement   Chevis Pretty, MD 12/29/2018, 4:02 PM Pager: [phone]

## 2018-12-30 LAB — BASIC METABOLIC PANEL
Anion gap: 5 (ref 5–15)
BUN: 8 mg/dL (ref 8–23)
CO2: 21 mmol/L — ABNORMAL LOW (ref 22–32)
Calcium: 9.3 mg/dL (ref 8.9–10.3)
Chloride: 112 mmol/L — ABNORMAL HIGH (ref 98–111)
Creatinine, Ser: 0.87 mg/dL (ref 0.44–1.00)
GFR calc non Af Amer: >60 mL/min (ref >60)
Glucose, Bld: 115 mg/dL — ABNORMAL HIGH (ref 70–99)
Potassium: 4.4 mmol/L (ref 3.5–5.1)
Sodium: 138 mmol/L (ref 135–145)

## 2018-12-30 LAB — MAGNESIUM: Magnesium: 2.2 mg/dL (ref 1.7–2.4)

## 2018-12-30 MED ORDER — AMLODIPINE BESYLATE 5 MG PO TABS
5.0000 mg | ORAL_TABLET | Freq: Every day | ORAL | Status: DC
  Filled 2018-12-30: qty 1

## 2018-12-30 NOTE — Progress Notes (Signed)
   Subjective: Patient was seen and evaluated at bedside on morning rounds. No acute events overnight. She answers questions with few words. Mentions that she is doing ok and does not report any issues. We talked to her about we are waiting for getting a bed in SNF.   Objective:  Vital signs in last 24 hours: Vitals:   12/29/18 1201 12/29/18 1545 12/29/18 2224 12/30/18 0327  BP: 132/60 118/60 122/66 130/61  Pulse: 66 71 66 67  Resp: 16 15 16    Temp: 97.8 F (36.6 C) 98.2 F (36.8 C) 98.4 F (36.9 C) 98.1 F (36.7 C)  TempSrc: Oral Oral Oral Oral  SpO2: 100% 100% 100% 100%  Weight:      Height:       Vital signs reviewed, nursing notes reviewed General:  NAD HEENT: Eyes: EOM nl CV: RRR, systolic murmur Lungs: CTA bilateraly Abdomen: BS are present, abdomen is soft and non tender to palpation Extremities: No edema, pulses are nl Neurologic exam: Alert and oriented x3. No neurological deficit. Motor is 5/5 at all 4 extremities.   Assessment/Plan:  Principal Problem:   TIA (transient ischemic attack) Active Problems:   AIDS (acquired immune deficiency syndrome) (HCC)   Carotid artery occlusion   CVA (cerebral vascular accident) (HCC)   Dementia (HCC)  63 year old female withpertinentpast medical history of strokew/o deficit, intracranialhemorrhage2015, hypertension, HIV,presented with expressive aphasia. CTAshowedright common carotid occlusionbutunclear how acute it is. Brain MRIwithout evidence of stroke.  TIA andright common carotid occlusion. Stable and asymptomatic. Exam nl and at baseline.  -Medically ready to dischargeto SNF. SW is following. -Continue ASA, Plavixfor 3 weeks then only Plavix  -Continue pravastatin  Hypokalemia: normalized with PO K. Today K:4.1 -F/u Mg and replete as needed  Dementia: Moderate with some cognitive impairment.  She has capacity of understanding her medicacal condition andagreed to go to nursing facility  after discharge.  - Appreciate social worker follow up for SNF placement.  Hypertension:BP is 130/61 BP goal higherhaving carotid occlusion. If BP dropes, will hold Amlodipine again.  -Continueamlodipine5 mg QD. HoldingHCTZ -Will f/u in clinic after DC.  Newly diagnosed HIV  -Continue Biktarvy and Bactrim -Follow-up with infectious disease after discharge -HCV viral load is elevated. (ordered by ID) Appreciate ID recommendation  Dispo:Discharge pending untilSNFplacement    Chevis Pretty, MD 12/30/2018, 6:39 AM Pager: [phone]

## 2018-12-30 NOTE — Progress Notes (Signed)
Physical Therapy Treatment Patient Details Name: Lindsey Leon MRN: [id number] DOB: [birth year] Today's Date: 12/30/2018    History of Present Illness 63 y.o. 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     PT Comments    Pt agreeable to participate with encouragement.  Pt is not a great historian and I had difficulty with understanding how her gait quality compared to her usual normal.  Emphasis on gait stability, balance challenge and stair training.    Follow Up Recommendations  SNF;Supervision/Assistance - 24 hour     Equipment Recommendations  None recommended by PT    Recommendations for Other Services       Precautions / Restrictions Precautions Precautions: Fall    Mobility  Bed Mobility Overal bed mobility: Modified Independent                Transfers Overall transfer level: Needs assistance Equipment used: None Transfers: Sit to/from Stand Sit to Stand: Supervision         General transfer comment: for safety  Ambulation/Gait Ambulation/Gait assistance: Min guard;Min assist Gait Distance (Feet): 200 Feet Assistive device: None Gait Pattern/deviations: Step-through pattern Gait velocity: decreased   General Gait Details: slow, mildly guarded and unsteady,  deviations ocurring with scanning, frequent back stepping to regain balance  Wider BOS and bil arm adducted in front of her body to assist with balance  R>L    Stairs Stairs: Yes Stairs assistance: Min guard Stair Management: One rail Right;Two rails;Alternating pattern;Forwards Number of Stairs: 5 General stair comments: guarded, moderate use of the rails   Wheelchair Mobility    Modified Rankin (Stroke Patients  Only) Modified Rankin (Stroke Patients Only) Pre-Morbid Rankin Score: Moderate disability Modified Rankin: Moderately severe disability     Balance Overall balance assessment: Needs assistance   Sitting balance-Leahy Scale: Good     Standing balance support: During functional activity Standing balance-Leahy Scale: Fair                              Cognition Arousal/Alertness: Awake/alert Behavior During Therapy: WFL for tasks assessed/performed Overall Cognitive Status: Impaired/Different from baseline Area of Impairment: Memory;Following commands;Safety/judgement;Awareness;Problem solving                   Current Attention Level: Selective Memory: Decreased short-term memory Following Commands: Follows one step commands consistently Safety/Judgement: Decreased awareness of safety;Decreased awareness of deficits Awareness: Emergent Problem Solving: Slow processing        Exercises      General Comments        Pertinent Vitals/Pain Pain Assessment: No/denies pain    Home Living                      Prior Function            PT Goals (current goals can now be found in the care plan section) Acute Rehab PT Goals Patient Stated Goal: "go back home" PT Goal Formulation: With patient Time For Goal Achievement: 01/08/19 Potential to Achieve Goals: Good Progress towards PT goals: Progressing toward goals    Frequency    Min 3X/week      PT Plan Discharge plan needs to be updated  Co-evaluation              AM-PAC PT "6 Clicks" Mobility   Outcome Measure  Help needed turning from your back to your side while in a flat bed without using bedrails?: None Help needed moving from lying on your back to sitting on the side of a flat bed without using bedrails?: None Help needed moving to and from a bed to a chair (including a wheelchair)?: A Little Help needed standing up from a chair using your arms (e.g., wheelchair or  bedside chair)?: A Little Help needed to walk in hospital room?: A Little Help needed climbing 3-5 steps with a railing? : A Little 6 Click Score: 20    End of Session   Activity Tolerance: Patient tolerated treatment well Patient left: in chair;with call bell/phone within reach;with chair alarm set Nurse Communication: Mobility status PT Visit Diagnosis: Other abnormalities of gait and mobility (R26.89)     Time: 1694-5038 PT Time Calculation (min) (ACUTE ONLY): 21 min  Charges:  $Gait Training: 8-22 mins                     12/30/2018  Shannon Bing, PT Acute Rehabilitation Services [phone]  (pager) [phone]  (office)   Eliseo Gum Eoghan Belcher 12/30/2018, 5:34 PM

## 2018-12-31 LAB — BASIC METABOLIC PANEL
Anion gap: 8 (ref 5–15)
BUN: 11 mg/dL (ref 8–23)
CALCIUM: 10 mg/dL (ref 8.9–10.3)
CO2: 21 mmol/L — ABNORMAL LOW (ref 22–32)
Chloride: 109 mmol/L (ref 98–111)
Creatinine, Ser: 0.91 mg/dL (ref 0.44–1.00)
GFR calc Af Amer: >60 mL/min (ref >60)
Glucose, Bld: 109 mg/dL — ABNORMAL HIGH (ref 70–99)
Potassium: 4.3 mmol/L (ref 3.5–5.1)
SODIUM: 138 mmol/L (ref 135–145)

## 2018-12-31 LAB — CBC
HCT: 32.8 % — ABNORMAL LOW (ref 36.0–46.0)
Hemoglobin: 11.6 g/dL — ABNORMAL LOW (ref 12.0–15.0)
MCH: 33 pg (ref 26.0–34.0)
MCHC: 35.4 g/dL (ref 30.0–36.0)
MCV: 93.4 fL (ref 80.0–100.0)
Platelets: 154 10*3/uL (ref 150–400)
RBC: 3.51 MIL/uL — ABNORMAL LOW (ref 3.87–5.11)
RDW: 13.5 % (ref 11.5–15.5)
WBC: 4.4 10*3/uL (ref 4.0–10.5)
nRBC: 0 % (ref 0.0–0.2)

## 2018-12-31 LAB — HLA B*5701: HLA B 5701: NEGATIVE

## 2018-12-31 NOTE — TOC Progression Note (Signed)
Transition of Care [facility]) - Progression Note    Patient Details  Name: Lindsey Leon MRN: [id number] Date of Birth: 06-11-1956  [facility]) CM/SW Contact  Lindsey Leon, [place] Phone Number: 12/31/2018, 3:54 PM  Clinical Narrative:  CSW spoke with patient's sister Lindsey Leon about bed offers. Lindsey Leon selected [place]. Patient continues to await assignment of pending Medicaid number prior to discharge with LOG. MD aware of barriers to discharge at this time.     Expected Discharge Plan: Skilled Nursing Facility Barriers to Discharge: Unsafe home situation, SNF Pending Medicaid, Inadequate or no insurance  Expected Discharge Plan and Services Expected Discharge Plan: Skilled Nursing Facility Discharge Planning Services: CM Consult   Living arrangements for the past 2 months: Apartment Expected Discharge Date: 12/26/18                         Social Determinants of Health (SDOH) Interventions    Readmission Risk Interventions 30 Day Unplanned Readmission Risk Score     ED to Hosp-Admission (Current) from 12/24/2018 in [place] [facility]  30 Day Unplanned Readmission Risk Score (%)  12 Filed at 12/31/2018 1200     This score is the patient's risk of an unplanned readmission within 30 days of being discharged (0 -100%). The score is based on dignosis, age, lab data, medications, orders, and past utilization.   Low:  0-14.9   Medium: 15-21.9   High: 22-29.9   Extreme: 30 and above       No flowsheet data found.

## 2018-12-31 NOTE — Progress Notes (Addendum)
Physical Therapy Treatment Patient Details Name: Lindsey Leon MRN: [id number] DOB: 06/23/1956 Today's Date: 12/31/2018    History of Present Illness 63 y.o. 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     PT Comments    Pt performed gait training and functional mobility.  Utilized RW based on previous note and she appears to be more distracted by the device and does not utilize it correctly.  Plan for progression of gait training without device and higher level balance activities.  Plan for SNF remains appropriate at this time.    Follow Up Recommendations  SNF;Supervision/Assistance - 24 hour     Equipment Recommendations  None recommended by PT    Recommendations for Other Services       Precautions / Restrictions Precautions Precautions: Fall Restrictions Weight Bearing Restrictions: No    Mobility  Bed Mobility Overal bed mobility: Modified Independent                Transfers Overall transfer level: Needs assistance Equipment used: Rolling walker (2 wheeled) Transfers: Sit to/from Stand Sit to Stand: Supervision         General transfer comment: for safety  Ambulation/Gait Ambulation/Gait assistance: Min assist Gait Distance (Feet): 150 Feet Assistive device: Rolling walker (2 wheeled) Gait Pattern/deviations: Step-through pattern     General Gait Details: Pt with L drift required assistance to correct path of RW.  Pt may do better without device next session.  pt unable to locate her room.  Cues for RW safety.     Stairs             Wheelchair Mobility    Modified Rankin (Stroke Patients Only) Modified Rankin (Stroke Patients Only) Pre-Morbid Rankin Score: Moderate  disability Modified Rankin: Moderately severe disability     Balance Overall balance assessment: Needs assistance   Sitting balance-Leahy Scale: Good       Standing balance-Leahy Scale: Fair                              Cognition Arousal/Alertness: Awake/alert Behavior During Therapy: WFL for tasks assessed/performed Overall Cognitive Status: Impaired/Different from baseline Area of Impairment: Memory;Following commands;Safety/judgement;Awareness;Problem solving                   Current Attention Level: Selective Memory: Decreased short-term memory Following Commands: Follows one step commands consistently Safety/Judgement: Decreased awareness of safety;Decreased awareness of deficits Awareness: Emergent Problem Solving: Slow processing        Exercises      General Comments        Pertinent Vitals/Pain Pain Assessment: No/denies pain    Home Living                      Prior Function            PT Goals (current goals can now be found in the care plan section) Acute Rehab PT Goals Patient Stated Goal: "go back home" Potential to Achieve Goals: Fair Progress towards PT goals: Progressing toward goals    Frequency    Min 3X/week      PT Plan Current plan remains appropriate    Co-evaluation  AM-PAC PT "6 Clicks" Mobility   Outcome Measure  Help needed turning from your back to your side while in a flat bed without using bedrails?: None Help needed moving from lying on your back to sitting on the side of a flat bed without using bedrails?: None Help needed moving to and from a bed to a chair (including a wheelchair)?: A Little Help needed standing up from a chair using your arms (e.g., wheelchair or bedside chair)?: A Little Help needed to walk in hospital room?: A Little Help needed climbing 3-5 steps with a railing? : A Little 6 Click Score: 20    End of Session Equipment Utilized During  Treatment: Gait belt Activity Tolerance: Patient tolerated treatment well Patient left: in chair;with call bell/phone within reach;with chair alarm set Nurse Communication: Mobility status PT Visit Diagnosis: Other abnormalities of gait and mobility (R26.89)     Time: 8088-1103 PT Time Calculation (min) (ACUTE ONLY): 23 min  Charges:  $Gait Training: 8-22 mins $Therapeutic Activity: 8-22 mins                     Joycelyn Rua, PTA Acute Rehabilitation Services Pager [phone] Office [phone]     Lindsey Leon Artis Delay 12/31/2018, 4:44 PM

## 2018-12-31 NOTE — Progress Notes (Signed)
   Subjective: Patient was seen and evaluated at bedside on morning rounds.  He is doing well and did not have any acute events overnight.  She denies any pain, no weakness.  Objective:  Vital signs in last 24 hours: Vitals:   12/30/18 0327 12/30/18 1953 12/31/18 0034 12/31/18 0344  BP: 130/61 116/67 (!) 115/51 (!) 126/57  Pulse: 67 71 79 66  Resp:  16 18 16   Temp: 98.1 F (36.7 C) 98.4 F (36.9 C) 98.2 F (36.8 C) 98.8 F (37.1 C)  TempSrc: Oral Oral Oral Oral  SpO2: 100% 100% 100% 99%  Weight:      Height:       Vital signs reviewed, nursing notes reviewed General:NAD HEENT: Eyes: EOM nl CV: RRR, has systolic murmur Lungs: CTA bilateraly, no wheeze, no crackle Abdomen: BS are present, abdomen is soft and non tender to palpation Extremities: No edema, pulses are nl Neurologic exam: Alert and oriented x3. No neurological deficit.  Assessment/Plan:  Principal Problem:   TIA (transient ischemic attack) Active Problems:   AIDS (acquired immune deficiency syndrome) (HCC)   Carotid artery occlusion   CVA (cerebral vascular accident) (HCC)   Dementia (HCC)   63 year old female withpertinentpast medical history of strokew/o deficit, intracranialhemorrhage2015, hypertension, HIV,presented with expressive aphasia. CTAshowedright common carotid occlusionbutunclear how acute it is. Brain MRIwithout evidence of stroke.  TIA andright common carotid occlusion. Stable and asymptomatic. No neurologic deficit  -Continue ASA, Plavixfor 3 weeks then only Plavix  -Continue pravastatin -Medically ready to dischargeto SNF  Hypokalemia: Replaced and resolved.  K:4.3 Today Mg normal at 2.2  Dementia: Moderate with some cognitive impairment. She has capacity of understanding her medicacal condition andagreed to go to nursing facility after discharge. No update about placement - Appreciate social worker follow up for SNF placement.  Hypertension:BP is  126/57 BP goal higherhaving carotid occlusion. Holding Amlodipine and HCTZ  -Will f/u in clinic after DC.  Newly diagnosed HIV  -Continue Biktarvy and Bactrim -HCV positive with elevated viral load, not immune that for hep B -Follow-up with infectious disease after discharge  Dispo:Discharge pending untilSNFplacement   Chevis Pretty, MD 12/31/2018, 7:19 AM Pager: [phone]

## 2018-12-31 NOTE — TOC Progression Note (Signed)
Transition of Care [facility]) - Progression Note    Patient Details  Name: Lindsey Leon MRN: [id number] Date of Birth: 09/10/1956  Transition of Care [facility]) CM/SW Contact  Baldemar Lenis, [place] Phone Number: 12/31/2018, 3:00 PM  Clinical Narrative:   RNCM alerted CSW that plan changed to SNF. 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 CSW faxed referrals, awaiting responses from SNF on bed offers.    Expected Discharge Plan: Skilled Nursing Facility Barriers to Discharge: Unsafe home situation, SNF Pending Medicaid, Inadequate or no insurance  Expected Discharge Plan and Services Expected Discharge Plan: Skilled Nursing Facility Discharge Planning Services: CM Consult   Living arrangements for the past 2 months: Apartment Expected Discharge Date: 12/26/18                         Social Determinants of Health (SDOH) Interventions    Readmission Risk Interventions 30 Day Unplanned Readmission Risk Score     ED to Hosp-Admission (Current) from 12/24/2018 in [place] [facility]  30 Day Unplanned Readmission Risk Score (%)  12 Filed at 12/31/2018 1200     This score is the patient's risk of an unplanned readmission within 30 days of being discharged (0 -100%). The score is based on dignosis, age, lab data, medications, orders, and past utilization.   Low:  0-14.9   Medium: 15-21.9   High: 22-29.9   Extreme: 30 and above       No flowsheet data found.

## 2018-12-31 NOTE — Progress Notes (Signed)
  Speech Language Pathology Treatment: Cognitive-Linquistic  Patient Details Name: Lindsey Leon MRN: [id number] DOB: [birth year] Today's Date: 12/31/2018 Time: 6812-7517 SLP Time Calculation (min) (ACUTE ONLY): 24 min  Assessment / Plan / Recommendation Clinical Impression  Pt continues to present with severe cognitive deficits impacting her ability to perform basic self-care tasks.  Pt was oriented to person, place, and reason for hospitalization, but not to time.  Pt is largely unaware of deficits and does not appreciate any changes to her cognitive status with this hospitalization.  With education and questioning, pt did agree that she might need help with some tasks (meals, medication, finances). Pt demonstrated no carryover from previous session.  Pt demonstrated 0% accuracy with immediate recall of functional information (memory compensatory strategies).  Pt did appear to benefit from multiple choice options. Pt followed 1 and 2 step tasks with 100% accuracy, and 3 step tasks with 50% accuracy, likely 2/2 impaired immediate recall.  Pt completed verbal problem solving tasks with 40% accuracy.  Pt was able to recall and demonstrate use of call bell and generated 2 reasons to use call bell. Pt correctly identified reasons to se call bell with 60% accuracy. Pt will likely need 24 hour supervision at next level of care.   HPI HPI: Pt is 63 year old woman with a history of TIA, essential hypertension, prior alcohol use disorder and recently diagnosed with HIV-1 who presents with the sudden onset of right hand weakness and difficulty finding words. She was determined to have transient aphasia. MRI of the brain was negative for any acute findings.      SLP Plan  Continue with current plan of care       Recommendations         Follow up Recommendations: 24 hour supervision/assistance SLP Visit Diagnosis: Cognitive communication deficit (R41.841);Attention and concentration  deficit Attention and concentration deficit following: Other cerebrovascular disease Plan: Continue with current plan of care       GO                Kerrie Pleasure, MA, CCC-SLP Acute Rehabilitation Services Office: [phone]; Pager (3/17): [phone] 12/31/2018, 10:12 AM

## 2019-01-01 MED ORDER — PRAVASTATIN SODIUM 10 MG PO TABS
20.0000 mg | ORAL_TABLET | Freq: Every day | ORAL | Status: DC
  Administered 2019-01-01: 20 mg via ORAL
  Filled 2019-01-01: qty 2

## 2019-01-01 MED ORDER — CLOPIDOGREL BISULFATE 75 MG PO TABS: 75.0000 mg | ORAL_TABLET | Freq: Every day | ORAL | 0 refills | Status: DC

## 2019-01-01 MED ORDER — BICTEGRAVIR-EMTRICITAB-TENOFOV 50-200-25 MG PO TABS: 1.0000 | ORAL_TABLET | Freq: Every day | ORAL | 0 refills | Status: AC

## 2019-01-01 MED ORDER — ASPIRIN 81 MG PO TBEC: 81.0000 mg | DELAYED_RELEASE_TABLET | Freq: Every day | ORAL | 0 refills | Status: AC

## 2019-01-01 MED ORDER — SULFAMETHOXAZOLE-TRIMETHOPRIM 800-160 MG PO TABS: 1.0000 | ORAL_TABLET | Freq: Every day | ORAL | 0 refills | Status: DC

## 2019-01-01 NOTE — Progress Notes (Signed)
   Subjective: Patient was seen and evaluated at bedside on morning rounds. No acute events overnight.  She is eating breakfast and mentions that she is doing good and walked in the hall yesterday.  She denies any weakness or other complaints. We discussed that per social worker, she may get a bed at SNF today and in that case, we may be able to discharge her. She mentions that she is not happy about going to rehab or nursing home but she should do what she should do.  Objective:  Vital signs in last 24 hours: Vitals:   12/31/18 1143 12/31/18 1930 01/01/19 0020 01/01/19 0358  BP: 91/76 138/65 111/61 107/70  Pulse: 93 79 84 84  Resp: 15 16 16 16   Temp: 97.9 F (36.6 C) 99.2 F (37.3 C) 98.6 F (37 C) 97.7 F (36.5 C)  TempSrc: Oral Oral Oral Oral  SpO2:  100% 100%   Weight:      Height:       Vital signs reviewed, nursing notes reviewed General:NAD HEENT: Eyes: EOM nl CV: RRR, has systolic murmur Lungs:CTA bilateraly, no wheeze, no crackle Abdomen: BS are present, abdomen is soft and no tenderness to palpation Extremities:No edema, pulses are nl Neurologic exam: Alert and oriented x3. Noneurologicaldeficit.  No facial droop, motor 5/5 in all extremities.   Assessment/Plan:  Principal Problem:   TIA (transient ischemic attack) Active Problems:   AIDS (acquired immune deficiency syndrome) (HCC)   Carotid artery occlusion   CVA (cerebral vascular accident) (HCC)   Dementia (HCC)  63 year old female withpertinentpast medical history of strokew/o deficit, intracranialhemorrhage2015, hypertension, HIV,presented with expressive aphasia.  TIA andright common carotid occlusion. Stable and asymptomatic. No neurologic deficit Neurology did not recommend any urgent intervention.  Commended continuing dual antiplatelet therapy, pravastatin and ambulatory referral to vascular surgery.  -Continue ASA, Plavixfor 3 weeks then only Plavix  -Continue pravastatin  -Medically ready to dischargeto SNF -Outpatient referral to vascular surgery for follow-up  Dementia: Moderate with some cognitive impairment. Shehas capacity of understanding her medicacal condition and agreed to go to nursing facility after discharge. Talk to social worker yesterday, she mentioned that SNF placement process requires Medicaid pending number that hopefully will happen today. She is hopeful that we may be able to get a bed and discharge patient today.   - Medically stable to discharge - Appreciate social worker follow up for SNF placement.  Hypertension:BP today: 107/70 Was on permissive hypertension initially. Currently has normal/soft BP despite not being on any antihypertensive medications.  She does not have any neurologic symptoms to be concerned about cerebral hypoperfusion due to soft BP.  -Holding Amlodipine and HCTZ -Will f/u in clinic after DC.  Newly diagnosed HIV  -Continue Biktarvy and Bactrim -HCV positive with elevated viral load,  also not immune for hep B -Follow-up with infectious disease after discharge  Dispo:Discharge pending untilSNFplacement   Chevis Pretty, MD 01/01/2019, 5:30 AM [phone]

## 2019-01-01 NOTE — TOC Transition Note (Signed)
Transition of Care [facility]) - CM/SW Discharge Note   Patient Details  Name: Lindsey Leon MRN: [id number] Date of Birth: [birth year]  [facility]) CM/SW Contact:  Baldemar Lenis, LCSW Phone Number: 01/01/2019, 2:29 PM   Clinical Narrative:   Nurse to call report to [phone], Room 416B.    Final next level of care: Skilled Nursing Facility Barriers to Discharge: No Barriers Identified   Patient Goals and CMS Choice   CMS Medicare.gov Compare Post Acute Care list provided to:: Patient Represenative (must comment)(patient confused, provided list to patient's sister) Choice offered to / list presented to : Sibling  Discharge Placement              Patient [facility] bed at: [facility] Patient to be transferred to facility by: [facility] Name of family member notified: Sister [name] Patient and family notified of of transfer: 01/01/19  Discharge Plan and Services Discharge Planning Services: CM Consult                      Social Determinants of Health (SDOH) Interventions     Readmission Risk Interventions No flowsheet data found.

## 2019-01-01 NOTE — Progress Notes (Signed)
Report called to [place] at [facility].  Awaiting pickup from [facility].  Pt belongings are packed.

## 2019-01-02 ENCOUNTER — Ambulatory Visit: Payer: Self-pay

## 2019-01-06 ENCOUNTER — Inpatient Hospital Stay: Payer: Self-pay | Admitting: Family

## 2019-01-23 LAB — REFLEX TO GENOSURE(R) MG EDI: HIV GenoSure(R): 1

## 2019-01-23 LAB — HIV GENOSURE(R) MG

## 2019-01-23 LAB — HIV-1 RNA, PCR (GRAPH) RFX/GENO EDI
HIV-1 RNA BY PCR: 1190000 copies/mL
HIV-1 RNA Quant, Log: 6.076 log10copy/mL

## 2019-02-03 ENCOUNTER — Other Ambulatory Visit: Payer: Self-pay

## 2019-02-03 ENCOUNTER — Inpatient Hospital Stay: Payer: Self-pay | Admitting: Family

## 2019-02-03 ENCOUNTER — Ambulatory Visit: Payer: Self-pay

## 2019-02-04 ENCOUNTER — Other Ambulatory Visit (HOSPITAL_COMMUNITY): Payer: Self-pay | Admitting: Pharmacist

## 2019-02-04 MED ORDER — BICTEGRAVIR-EMTRICITAB-TENOFOV 50-200-25 MG PO TABS: 1.0000 | ORAL_TABLET | Freq: Every day | ORAL | 3 refills | Status: DC

## 2019-02-04 MED FILL — BIKTARVY 50-200-25 MG TABS: 50-200-25 | 30 days supply | Qty: 30 | Fill #0

## 2019-02-14 DIAGNOSIS — E1165 Type 2 diabetes mellitus with hyperglycemia: Secondary | ICD-10-CM | POA: Insufficient documentation

## 2019-02-20 ENCOUNTER — Other Ambulatory Visit: Payer: Self-pay

## 2019-02-20 ENCOUNTER — Ambulatory Visit: Payer: Self-pay | Admitting: Infectious Diseases

## 2019-02-25 ENCOUNTER — Ambulatory Visit: Payer: Self-pay | Admitting: Infectious Diseases

## 2019-02-25 ENCOUNTER — Telehealth: Payer: Self-pay | Admitting: *Deleted

## 2019-02-25 ENCOUNTER — Telehealth: Payer: Self-pay | Admitting: Pharmacy Technician

## 2019-02-25 NOTE — Telephone Encounter (Signed)
Called [facility] in reference to the patient missing 2 hospital follow up/New B20 appointments. Spoke with the nurse manager who advised they were not bringing patients to office visits due to Covid 19. Advised her no one communicated that to [place] either time and with the patient condition she needs to be seen. Asked her if the patient was receiving her Biktarvy she was prescribed while inpatient and she reported yes, daily. We scheduled the patient for Thursday 02/27/19 to see Dr Lorenso Courier. She advised she will speak with the family and her supervisor and call back if that can not happen. Advised her patient will need someone with her as she reported the patient is "in and out" cognitively. She advised she understands and will call back as soon as she runs it by family and supervisor. Gave her phone number and address.

## 2019-02-25 NOTE — Telephone Encounter (Addendum)
[facility] Patient Product/process development scientist completed.    The patient is uninsured and has patient assistance for Biktarvy through 12/25/2019.  ID [id number] BIN [id number] PCN [id number]  GRP [id number]  Kathie Rhodes E. Dimas Aguas CPhT Specialty Pharmacy Patient [facility] for Infectious Disease Phone: [phone] Fax:  [phone]

## 2019-02-27 ENCOUNTER — Ambulatory Visit: Payer: Self-pay | Admitting: Infectious Diseases

## 2019-03-04 ENCOUNTER — Other Ambulatory Visit: Payer: Self-pay

## 2019-03-04 ENCOUNTER — Encounter: Payer: Self-pay | Admitting: Infectious Diseases

## 2019-03-04 ENCOUNTER — Ambulatory Visit (INDEPENDENT_AMBULATORY_CARE_PROVIDER_SITE_OTHER): Payer: Self-pay | Admitting: Infectious Diseases

## 2019-03-04 ENCOUNTER — Telehealth: Payer: Self-pay | Admitting: Pharmacist

## 2019-03-04 VITALS — BP 124/73 | HR 91 | Temp 97.9°F | Wt 133.0 lb

## 2019-03-04 DIAGNOSIS — Z8673 Personal history of transient ischemic attack (TIA), and cerebral infarction without residual deficits: Secondary | ICD-10-CM

## 2019-03-04 DIAGNOSIS — B182 Chronic viral hepatitis C: Secondary | ICD-10-CM

## 2019-03-04 DIAGNOSIS — E1165 Type 2 diabetes mellitus with hyperglycemia: Secondary | ICD-10-CM

## 2019-03-04 DIAGNOSIS — B20 Human immunodeficiency virus [HIV] disease: Secondary | ICD-10-CM

## 2019-03-04 MED ORDER — SULFAMETHOXAZOLE-TRIMETHOPRIM 800-160 MG PO TABS: 1.0000 | ORAL_TABLET | Freq: Every day | ORAL | 1 refills | Status: DC

## 2019-03-04 MED ORDER — BICTEGRAVIR-EMTRICITAB-TENOFOV 50-200-25 MG PO TABS: 1.0000 | ORAL_TABLET | Freq: Every day | ORAL | 1 refills | Status: DC

## 2019-03-04 NOTE — Progress Notes (Signed)
Lindsey Leon  [id number]  [birth year]    HPI: The patient is a 63 y.o. y/o AA female presenting today to establish care for HIV infection. She was recently diagnosed at [facility] in 12/2018 while admitted for a CVA. She was seen by Dr. Linus Salmons and our NP at the time, but none of her HIV related labs aside from her diagnostic Ab had resulted at the time of her hospital discharge. She has been in a SNF since her hospital d/c in 12/2018 due to cognitive issues and memory loss that preclude her from successfully taking ARVs without supervision. Her initial and most recent CD4 count was 50 and concurrent HIV viral load was 1.129 million copies. Her CD4 nadir is 50. She was ARV naive in 12/2018 prior to starting biktarvy. She was concurrently diagnosed with HCV while hospitalized.  Chronic infection with hepatitis C was confirmed with an HCV viral load of 19,000,000 IU.  Her HIV was diagnosed in 2020 in [place], [place].  Her HLA [id number] allele was negative.  Approximately 5 months prior to her most recent hospitalization, she developed a rash to her arms that persisted for several months. Per her sister, this rash has only improved after she started ARVs. She is OTW without complaints today. Of note, we have had significant difficulty with scheduling the patient for her clinic appointment as her facility was on a CoVID-19 lockdown during the pandemic.  However, it does appear that she was continue to receive her Biktarvy while at their facility.   Past Medical History:  Diagnosis Date  . Chronic hepatitis C ([place])    dx'ed in 12/2018  . Diverticulitis   . HIV infection ([place])    dx'ed in 12/2018  . Hypertension    not on medications  . Pancreatitis   . Stroke [facility])     Past Surgical History:  Procedure Laterality Date  . FLEXIBLE SIGMOIDOSCOPY N/A 02/26/2013   Procedure: FLEXIBLE SIGMOIDOSCOPY;  Surgeon: Beryle Beams, MD;  Location: [place];  Service: Endoscopy;  Laterality: N/A;      Family History  Problem Relation Age of Onset  . Healthy Father   . Cancer Mother        passed away. unknown age  . Hypertension Sister   . Diabetes Mellitus II Sister      Social History   Tobacco Use  . Smoking status: Former Smoker    Packs/day: 0.50    Years: 30.00    Pack years: 15.00    Types: Cigarettes    Quit date: 05/22/2015    Years since quitting: 4.1  . Smokeless tobacco: Never Used  Substance Use Topics  . Alcohol use: No    Alcohol/week: 0.0 standard drinks  . Drug use: Yes    Types: Marijuana  Pt is currently a SNF resident following her recent stroke due to ongoing cognitive/memory issues. She did live with her sister prior to her CVA.  Pt is not currently sexually active. +heterosexual   No Known Allergies   Outpatient Medications Prior to Visit  Medication Sig Dispense Refill  . clopidogrel (PLAVIX) 75 MG tablet Take 1 tablet (75 mg total) by mouth daily. 21 tablet 0  . bictegravir-emtricitabine-tenofovir AF (BIKTARVY) 50-200-25 MG TABS tablet Take 1 tablet by mouth daily. 30 tablet 3  . sulfamethoxazole-trimethoprim (BACTRIM DS,SEPTRA DS) 800-160 MG tablet Take 1 tablet by mouth daily. 30 tablet 0   No facility-administered medications prior to visit.      Review of  Systems  Constitutional: Positive for fatigue. Negative for chills and fever.  HENT: Negative for congestion, hearing loss and sinus pressure.   Eyes: Negative for photophobia, discharge, redness and visual disturbance.  Respiratory: Negative for apnea, cough, shortness of breath and wheezing.   Cardiovascular: Negative for chest pain and leg swelling.  Gastrointestinal: Negative for abdominal distention, abdominal pain, constipation, diarrhea, nausea and vomiting.  Endocrine: Negative for cold intolerance, heat intolerance, polydipsia and polyuria.  Genitourinary: Negative for dysuria, flank pain, frequency, urgency, vaginal bleeding and vaginal discharge.  Musculoskeletal: Negative  for arthralgias, back pain, joint swelling and neck pain.  Skin: Negative for pallor and rash.  Allergic/Immunologic: Positive for immunocompromised state.  Neurological: Negative for dizziness, seizures, speech difficulty, weakness and headaches.  Hematological: Does not bruise/bleed easily.  Psychiatric/Behavioral: Positive for confusion and decreased concentration. Negative for agitation, hallucinations and sleep disturbance. The patient is nervous/anxious.     Vitals:   03/04/19 1358  BP: 124/73  Pulse: 91  Temp: 97.9 F (36.6 C)   Physical Exam Gen: chronically ill-appearing, frail, NAD, A&Ox 2 Head: NCAT, no temporal wasting evident EENT: PERRL, EOMI, MMM, adequate dentition Neck: supple, no JVD CV: NRRR, no murmurs evident Pulm: CTA bilaterally, mild expiratory wheeze, no retractions Abd: soft, NTND, +BS Extrems: 1+ non-pitting LE edema, 1+ pulses Skin: no rashes, adequate skin turgor Neuro: CN II-XII grossly intact, no focal motor neurologic deficits appreciated, gait was not assessed, A&Ox 2  Labs: HIV RNA PCR - 1.129 million copies on 12/24/2018  Assessment/Plan: Patient is a 63 year old African-American female with recent stroke and questionable dementia presenting for care for HIV and hepatitis C coinfection.  HIV -patient was diagnosed in March of this year with his CD4 count initially of 50 and an HIV viral load of 1.129 million copies.  She was started on Biktarvy while hospitalized, which was continued while she convalesced to a skilled nursing facility.  Per the SNF's report, the patient has continued to receive her Biktarvy while at their facility.  We will repeat the patient's CD4 count and HIV viral load to assess her response to treatment thus far.  Of note, her HIV genotype showed wild-type virus.  As I am anticipating the patient's viral load will have improved but not yet be undetectable, will schedule the patient back for follow-up visit in 6 weeks with  intention to repeat her HIV viral load 2 weeks prior to that appointment so the labs can be reviewed in real-time.  Chronic hepatitis C -chronic infection was confirmed with screening labs while the patient was hospitalized.  In addition to her HCV antibody positivity, her HCV viral load was 19,000,000 IU.  We will check an HCV genotype today and consider staging with a FibroScan at a future visit.  Probable dementia -unclear if this is HIV related or organic.  The patient's ongoing cognitive and memory issues will serve as a significant barrier for her to independently adhere to antiretroviral treatment.  For this reason, I agree with her being in a skilled nursing facility where and nurse can administer her daily medications to ensure that her HIV remains controlled.  Moving forward, we may have to discuss both her CODE STATUS and goals of care with her sister who functions as her decision maker.  I am happy to continue to provide care for the patient's HIV, but I am concerned regarding the patient's independence and her long-term quality of life if her cognition issues fail to improve.  Addendum: The patient's CMP at  today's visit showed a blood sugar of 788.  She did not have a wide gap acidosis, so it appears the patient has hyperosmotic nonketotic hyperglycemia.  This does give her a new diagnosis of diabetes mellitus.  The medical director of the patient skilled nursing facility ([facility]) was notified of her new diagnosis, so insulin can be initiated and further oral meds and monitoring could be put in place.  Health maintenance - Continue Bactrim DS 1 tab daily for PCP/toxoplasma prophylaxis until her CD4 count is > 200. Pneumococcal vaccine now due but pt refused vaccine today. Tdap vaccine offered but pt refused vaccine. RPR and urine GC/chlamydia screens all negative in 12/2018. Vaccination for hepatitis A & B is due but pt refused all vaccines. Annual TB screening with quantiferon was negative  in 12/2018. Encouraged pt receive her annual flu vaccine today but she refused vaccination today. Check FLP with her next labs for annual cholesterol screening (pt reminded to be fasting at that time). Will check baseline toxoplasma IgG today in view of her recent CVA to look for reversible causes. HPV vaccine series not indicated due to age of pt. Pt is due for repeat pap smear and mammogram soon. Pt has been referred for annual dental cleaning. Condoms and water based lubricants were advised with all sexual encounters.

## 2019-03-04 NOTE — Progress Notes (Signed)
Patient will have medications filled at [facility] and mailed to her facility.

## 2019-03-04 NOTE — Telephone Encounter (Signed)
Patient's Laroy Apple approval has been extended and we are able to fill it at [facility]. They will ship it to her facility each month.

## 2019-03-04 NOTE — Patient Instructions (Signed)
Take biktarvy and bactrim every day as ordered. Draw labs in 4 weeks (see completed order sheet) and fax results to clinic. Notify us immediately if pt is discharged from SNF or cannot be brought to appointment.

## 2019-03-05 ENCOUNTER — Encounter: Payer: Self-pay | Admitting: Infectious Diseases

## 2019-03-05 ENCOUNTER — Telehealth: Payer: Self-pay | Admitting: *Deleted

## 2019-03-05 LAB — T-HELPER CELL (CD4) - (RCID CLINIC ONLY)
CD4 % Helper T Cell: 14 % — ABNORMAL LOW (ref 33–65)
CD4 T Cell Abs: 241 /uL — ABNORMAL LOW (ref 400–1790)

## 2019-03-05 MED FILL — BIKTARVY 50-200-25 MG TABS: 50-200-25 | 30 days supply | Qty: 30 | Fill #1

## 2019-03-05 NOTE — Telephone Encounter (Signed)
RN spoke with Kennyth Arnold, RN at [facility] regarding critical lab value of glucose = 788.  Per Kennyth Arnold, patient is in quarantine unit at this time and she will pass along the result.  Stacy repeated and verified lab result.   RN received call from [facility] regarding this lab value today at 10:17. Andree Coss, RN

## 2019-03-05 NOTE — Telephone Encounter (Signed)
-----   Message from Mooreland Lions, MD sent at 03/05/2019  9:45 AM EDT ----- Please notify the medical director of the patient's SNF [facility]) re: her hyperglycemia of 788 yesterday. She doesn't have a wide gap acidosis, so it looks like she's in HONK rather than DKA. I believe her DM is a new diagnosis as it wasn't listed in her chart from the hospital.

## 2019-03-07 LAB — TOXOPLASMA ANTIBODIES- IGG AND  IGM
Toxoplasma Antibody- IgM: <8 AU/mL
Toxoplasma IgG Ratio: <7.2 IU/mL

## 2019-03-13 ENCOUNTER — Telehealth: Payer: Self-pay

## 2019-03-13 NOTE — Telephone Encounter (Signed)
I called [facility] at [phone] that pt visit will be change to video due to COVID 19. Melissa Associate Professor stated they have [place] facetime or their work IPAP. She took my name and number and will call us back for more updates about setting up video visit.

## 2019-03-17 NOTE — Telephone Encounter (Signed)
Melissa @ [facility] has returned the call to RN Katrina re: using Facetime for the appointment . Please call her back at the [phone]

## 2019-03-17 NOTE — Telephone Encounter (Signed)
I call Melissa at facility. The call was transferred but not go to vm. Will call back later.

## 2019-03-18 NOTE — Telephone Encounter (Signed)
I called Lindsey Leon about pts appt tomorrow with Dr.SEthi for hospital follow up. Lindsey Leon stated they have a Ipad and can do web ex, zoom or facetime. I ask who can we text the link to or email link. I was put on hold by Lindsey Leon to check on about video visit. I had to call back because I was on hold for over 5 minutes. When I call back I was transfer to nursing station and no one pick up. Appt will be cancel and r/s if Rn is unable to get information on where link can be email.

## 2019-03-18 NOTE — Telephone Encounter (Signed)
Rn resent email again with link and appt information.

## 2019-03-18 NOTE — Telephone Encounter (Signed)
Lindsey Leon from Nursing Home called and provided email for link  [email or web address]  Link was sent to email , she checked email she stated nothing was received.

## 2019-03-19 ENCOUNTER — Ambulatory Visit: Payer: Self-pay | Admitting: Neurology

## 2019-03-19 ENCOUNTER — Other Ambulatory Visit: Payer: Self-pay

## 2019-03-19 NOTE — Telephone Encounter (Signed)
I called [facility] and ask to speak with Teacher, early years/pre.She was not at work and will arrive at 0900. I left message that email was sent again at [phone] for pts video visit today at 0930am.

## 2019-03-23 LAB — CBC WITH DIFFERENTIAL/PLATELET
Absolute Monocytes: 391 cells/uL (ref 200–950)
Basophils Absolute: 18 cells/uL (ref 0–200)
Basophils Relative: 0.4 %
Eosinophils Absolute: 129 cells/uL (ref 15–500)
Eosinophils Relative: 2.8 %
HCT: 39.6 % (ref 35.0–45.0)
Hemoglobin: 13.2 g/dL (ref 11.7–15.5)
Lymphs Abs: 1845 cells/uL (ref 850–3900)
MCH: 32.9 pg (ref 27.0–33.0)
MCHC: 33.3 g/dL (ref 32.0–36.0)
MCV: 98.8 fL (ref 80.0–100.0)
MPV: 10.6 fL (ref 7.5–12.5)
Monocytes Relative: 8.5 %
Neutro Abs: 2217 cells/uL (ref 1500–7800)
Neutrophils Relative %: 48.2 %
Platelets: 178 10*3/uL (ref 140–400)
RBC: 4.01 10*6/uL (ref 3.80–5.10)
RDW: 12.3 % (ref 11.0–15.0)
Total Lymphocyte: 40.1 %
WBC: 4.6 10*3/uL (ref 3.8–10.8)

## 2019-03-23 LAB — COMPREHENSIVE METABOLIC PANEL
AG Ratio: 1.1 (calc) (ref 1.0–2.5)
ALT: 44 U/L — ABNORMAL HIGH (ref 6–29)
AST: 34 U/L (ref 10–35)
Albumin: 4 g/dL (ref 3.6–5.1)
Alkaline phosphatase (APISO): 63 U/L (ref 37–153)
BUN/Creatinine Ratio: 17 (calc) (ref 6–22)
BUN: 19 mg/dL (ref 7–25)
CO2: 20 mmol/L (ref 20–32)
Calcium: 10.2 mg/dL (ref 8.6–10.4)
Chloride: 90 mmol/L — ABNORMAL LOW (ref 98–110)
Creat: 1.13 mg/dL — ABNORMAL HIGH (ref 0.50–0.99)
Globulin: 3.6 g/dL (calc) (ref 1.9–3.7)
Glucose, Bld: 788 mg/dL (ref 65–99)
Potassium: 4.9 mmol/L (ref 3.5–5.3)
Sodium: 125 mmol/L — ABNORMAL LOW (ref 135–146)
Total Bilirubin: 0.3 mg/dL (ref 0.2–1.2)
Total Protein: 7.6 g/dL (ref 6.1–8.1)

## 2019-03-23 LAB — HIV RNA, RTPCR W/R GT (RTI, PI,INT)
HIV 1 RNA Quant: 154 copies/mL — ABNORMAL HIGH
HIV-1 RNA Quant, Log: 2.19 Log copies/mL — ABNORMAL HIGH

## 2019-03-23 LAB — HEPATITIS C GENOTYPE

## 2019-03-23 LAB — GLUCOSE 6 PHOSPHATE DEHYDROGENASE: G-6PDH: 14.6 U/g Hgb (ref 7.0–20.5)

## 2019-03-24 ENCOUNTER — Encounter (HOSPITAL_COMMUNITY): Payer: Self-pay

## 2019-03-24 ENCOUNTER — Encounter: Payer: Self-pay | Admitting: Surgery

## 2019-03-24 NOTE — Telephone Encounter (Signed)
I called [facility] facility and tried to r/s pts video visit with Dr. Leonie Man. I was transfer to [facility] the Freight forwarder. The call was transfer and just rang rang for over 5 minutes. Call was not transfer to a vm. IF facility calls back please r/s video visit with Dr. Leonie Man.

## 2019-04-01 ENCOUNTER — Other Ambulatory Visit: Payer: Self-pay

## 2019-04-01 DIAGNOSIS — B20 Human immunodeficiency virus [HIV] disease: Secondary | ICD-10-CM

## 2019-04-01 DIAGNOSIS — Z79899 Other long term (current) drug therapy: Secondary | ICD-10-CM

## 2019-04-01 DIAGNOSIS — Z113 Encounter for screening for infections with a predominantly sexual mode of transmission: Secondary | ICD-10-CM

## 2019-04-01 MED FILL — BIKTARVY 50-200-25 MG TABS: 50-200-25 | 30 days supply | Qty: 30 | Fill #2

## 2019-04-02 LAB — T-HELPER CELLS (CD4) COUNT (NOT AT ARMC)
CD4 % Helper T Cell: 13 % — ABNORMAL LOW (ref 33–65)
CD4 T Cell Abs: 193 /uL — ABNORMAL LOW (ref 400–1790)

## 2019-04-02 LAB — URINE CYTOLOGY ANCILLARY ONLY
Chlamydia: NEGATIVE
Neisseria Gonorrhea: NEGATIVE

## 2019-04-05 LAB — LIPID PANEL
Cholesterol: 146 mg/dL (ref <200)
HDL: 31 mg/dL — ABNORMAL LOW (ref >=50)
LDL Cholesterol (Calc): 81 mg/dL (calc)
Non-HDL Cholesterol (Calc): 115 mg/dL (calc) (ref <130)
Total CHOL/HDL Ratio: 4.7 (calc) (ref <5.0)
Triglycerides: 247 mg/dL — ABNORMAL HIGH (ref <150)

## 2019-04-05 LAB — CBC WITH DIFFERENTIAL/PLATELET
Absolute Monocytes: 353 cells/uL (ref 200–950)
Basophils Absolute: 8 cells/uL (ref 0–200)
Basophils Relative: 0.2 %
Eosinophils Absolute: 98 cells/uL (ref 15–500)
Eosinophils Relative: 2.4 %
HCT: 37.4 % (ref 35.0–45.0)
Hemoglobin: 13.2 g/dL (ref 11.7–15.5)
Lymphs Abs: 1546 cells/uL (ref 850–3900)
MCH: 33.4 pg — ABNORMAL HIGH (ref 27.0–33.0)
MCHC: 35.3 g/dL (ref 32.0–36.0)
MCV: 94.7 fL (ref 80.0–100.0)
MPV: 10.4 fL (ref 7.5–12.5)
Monocytes Relative: 8.6 %
Neutro Abs: 2095 cells/uL (ref 1500–7800)
Neutrophils Relative %: 51.1 %
Platelets: 211 10*3/uL (ref 140–400)
RBC: 3.95 10*6/uL (ref 3.80–5.10)
RDW: 12.8 % (ref 11.0–15.0)
Total Lymphocyte: 37.7 %
WBC: 4.1 10*3/uL (ref 3.8–10.8)

## 2019-04-05 LAB — COMPREHENSIVE METABOLIC PANEL
AG Ratio: 1.2 (calc) (ref 1.0–2.5)
ALT: 187 U/L — ABNORMAL HIGH (ref 6–29)
AST: 94 U/L — ABNORMAL HIGH (ref 10–35)
Albumin: 4.3 g/dL (ref 3.6–5.1)
Alkaline phosphatase (APISO): 69 U/L (ref 37–153)
BUN: 13 mg/dL (ref 7–25)
CO2: 29 mmol/L (ref 20–32)
Calcium: 10.5 mg/dL — ABNORMAL HIGH (ref 8.6–10.4)
Chloride: 106 mmol/L (ref 98–110)
Creat: 0.82 mg/dL (ref 0.50–0.99)
Globulin: 3.5 g/dL (calc) (ref 1.9–3.7)
Glucose, Bld: 135 mg/dL — ABNORMAL HIGH (ref 65–99)
Potassium: 4.5 mmol/L (ref 3.5–5.3)
Sodium: 144 mmol/L (ref 135–146)
Total Bilirubin: 0.4 mg/dL (ref 0.2–1.2)
Total Protein: 7.8 g/dL (ref 6.1–8.1)

## 2019-04-05 LAB — RPR: RPR Ser Ql: NONREACTIVE

## 2019-04-05 LAB — HIV-1 RNA QUANT-NO REFLEX-BLD
HIV 1 RNA Quant: 33 copies/mL — ABNORMAL HIGH
HIV-1 RNA Quant, Log: 1.52 Log copies/mL — ABNORMAL HIGH

## 2019-04-15 ENCOUNTER — Telehealth: Payer: Self-pay | Admitting: Infectious Diseases

## 2019-04-16 ENCOUNTER — Encounter: Payer: Self-pay | Admitting: Infectious Diseases

## 2019-04-29 MED FILL — BIKTARVY 50-200-25 MG TABS: 50-200-25 | 30 days supply | Qty: 30 | Fill #3

## 2019-05-05 ENCOUNTER — Encounter: Payer: Self-pay | Admitting: Infectious Diseases

## 2019-05-29 ENCOUNTER — Other Ambulatory Visit: Payer: Self-pay | Admitting: Family

## 2019-05-29 DIAGNOSIS — B20 Human immunodeficiency virus [HIV] disease: Secondary | ICD-10-CM

## 2019-06-02 MED FILL — BIKTARVY 50-200-25 MG TABS: 50-200-25 | 30 days supply | Qty: 30 | Fill #0

## 2019-06-30 MED FILL — BIKTARVY 50-200-25 MG TABS: 50-200-25 | 30 days supply | Qty: 30 | Fill #1

## 2019-07-18 ENCOUNTER — Encounter: Payer: Self-pay | Admitting: Infectious Diseases

## 2019-07-30 MED FILL — BIKTARVY 50-200-25 MG TABS: 50-200-25 | 30 days supply | Qty: 30 | Fill #2

## 2019-08-27 MED FILL — BIKTARVY 50-200-25 MG TABS: 50-200-25 | 30 days supply | Qty: 30 | Fill #3

## 2019-09-22 ENCOUNTER — Telehealth: Payer: Self-pay

## 2019-09-22 ENCOUNTER — Other Ambulatory Visit: Payer: Self-pay | Admitting: Infectious Diseases

## 2019-09-22 DIAGNOSIS — B20 Human immunodeficiency virus [HIV] disease: Secondary | ICD-10-CM

## 2019-09-22 MED FILL — BIKTARVY 50-200-25 MG TABS: 50-200-25 | 30 days supply | Qty: 30 | Fill #0

## 2019-09-22 NOTE — Telephone Encounter (Signed)
Medication refill request submitted from pharmacy for Lindsey Leon; patient has not had 6 month follow up appointment made to follow up with Dr. Prince Rome to review labs and medication. Called facility where patient resides ([facility]) to schedule appointment for patient with same day labs. Appointment made with her RN for 12/18 @ 10:45am with same day labs.   Solae Norling Lorita Officer, RN

## 2019-10-03 ENCOUNTER — Ambulatory Visit: Payer: Medicaid Other | Admitting: Infectious Diseases

## 2019-10-24 ENCOUNTER — Other Ambulatory Visit: Payer: Self-pay | Admitting: Pharmacist

## 2019-10-24 DIAGNOSIS — B20 Human immunodeficiency virus [HIV] disease: Secondary | ICD-10-CM

## 2019-10-24 MED ORDER — BIKTARVY 50-200-25 MG PO TABS: 1.0000 | ORAL_TABLET | Freq: Every day | ORAL | 0 refills | Status: DC

## 2019-10-24 NOTE — Progress Notes (Signed)
Patient talked to Kathie Rhodes and wishes to have medication sent to her rehab facility. She promised to come to her appt in February. Will send in a refill to last until she comes to see Dr. Luciana Axe.

## 2019-10-27 MED FILL — BIKTARVY 50-200-25 MG TABS: 50-200-25 | 30 days supply | Qty: 30 | Fill #0

## 2019-11-19 ENCOUNTER — Ambulatory Visit: Payer: Medicaid Other | Admitting: Internal Medicine

## 2019-11-19 ENCOUNTER — Other Ambulatory Visit: Payer: Self-pay

## 2019-11-19 DIAGNOSIS — B20 Human immunodeficiency virus [HIV] disease: Secondary | ICD-10-CM

## 2019-11-19 MED ORDER — BIKTARVY 50-200-25 MG PO TABS: 1.0000 | ORAL_TABLET | Freq: Every day | ORAL | 0 refills | Status: DC

## 2019-11-19 MED ORDER — SULFAMETHOXAZOLE-TRIMETHOPRIM 800-160 MG PO TABS: 1.0000 | ORAL_TABLET | Freq: Every day | ORAL | 0 refills | Status: DC

## 2019-11-19 MED FILL — BIKTARVY 50-200-25 MG TABS: 50-200-25 | 30 days supply | Qty: 30 | Fill #0

## 2019-12-01 ENCOUNTER — Encounter: Payer: Self-pay | Admitting: Internal Medicine

## 2019-12-01 ENCOUNTER — Other Ambulatory Visit: Payer: Self-pay

## 2019-12-01 ENCOUNTER — Ambulatory Visit (INDEPENDENT_AMBULATORY_CARE_PROVIDER_SITE_OTHER): Payer: Medicaid Other | Admitting: Internal Medicine

## 2019-12-01 VITALS — BP 107/69 | HR 95 | Temp 98.0°F | Ht 66.0 in | Wt 153.0 lb

## 2019-12-01 DIAGNOSIS — Z9189 Other specified personal risk factors, not elsewhere classified: Secondary | ICD-10-CM

## 2019-12-01 DIAGNOSIS — B182 Chronic viral hepatitis C: Secondary | ICD-10-CM

## 2019-12-01 DIAGNOSIS — B20 Human immunodeficiency virus [HIV] disease: Secondary | ICD-10-CM | POA: Diagnosis present

## 2019-12-01 DIAGNOSIS — Z113 Encounter for screening for infections with a predominantly sexual mode of transmission: Secondary | ICD-10-CM | POA: Insufficient documentation

## 2019-12-01 NOTE — Progress Notes (Signed)
   Subjective:    Patient ID: Lindsey Leon, female    DOB: 05/30/1956, 64 y.o.   MRN: [id number]  HPI Here for follow up of HIV She was diagnosed last year during hospitalization for a CVA and started on Biktarvy.  She also has chronic hepatitis C and has not been on treatment before. No complaints.   Genotype 1 a.   Last CD4 of 193, viral load down to 33 copies.    Review of Systems  Constitutional: Negative for unexpected weight change.  Gastrointestinal: Negative for diarrhea and nausea.  Skin: Negative for rash.       Objective:   Physical Exam Constitutional:      Appearance: Normal appearance.  Eyes:     General: No scleral icterus. Skin:    Findings: No rash.  Neurological:     Mental Status: She is alert.   SH: continues living in a NH        Assessment & Plan:

## 2019-12-01 NOTE — Addendum Note (Signed)
Addended by: Mariea Clonts D on: 12/01/2019 02:34 PM   Modules accepted: Orders

## 2019-12-01 NOTE — Assessment & Plan Note (Signed)
Will continue with Bactrim for now and likely stop next visit depending on CD4 count.

## 2019-12-01 NOTE — Assessment & Plan Note (Signed)
Continues on [place] and doing well, no changes.  rtc 6 months.

## 2019-12-01 NOTE — Assessment & Plan Note (Signed)
Will get appropriate labs and ultrasound and consider treatment once results are back.

## 2019-12-02 ENCOUNTER — Telehealth: Payer: Self-pay | Admitting: Pharmacy Technician

## 2019-12-02 LAB — T-HELPER CELL (CD4) - (RCID CLINIC ONLY)
CD4 % Helper T Cell: 19 % — ABNORMAL LOW (ref 33–65)
CD4 T Cell Abs: 335 /uL — ABNORMAL LOW (ref 400–1790)

## 2019-12-02 NOTE — Telephone Encounter (Signed)
[facility] Patient Advocate Encounter   Have needed readiness to treat evaluation form to submit to [facility] Medicaid once medication need is determined for Hepatitis C.  Netty Starring. Dimas Aguas CPhT Specialty Pharmacy Patient [facility] for Infectious Disease Phone: [phone] Fax:  [phone]

## 2019-12-05 LAB — LIVER FIBROSIS, FIBROTEST-ACTITEST
ALT: 23 U/L (ref 6–29)
Alpha-2-Macroglobulin: 454 mg/dL — ABNORMAL HIGH (ref 106–279)
Apolipoprotein A1: 143 mg/dL (ref 101–198)
Bilirubin: 0.4 mg/dL (ref 0.2–1.2)
Fibrosis Score: 0.74
GGT: 66 U/L — ABNORMAL HIGH (ref 3–65)
Haptoglobin: 65 mg/dL (ref 43–212)
Necroinflammat ACT Score: 0.18
Reference ID: 3273979

## 2019-12-05 LAB — PROTIME-INR
INR: 1
Prothrombin Time: 10.4 s (ref 9.0–11.5)

## 2019-12-05 LAB — COMPLETE METABOLIC PANEL WITH GFR
AG Ratio: 1.5 (calc) (ref 1.0–2.5)
ALT: 22 U/L (ref 6–29)
AST: 22 U/L (ref 10–35)
Albumin: 4 g/dL (ref 3.6–5.1)
Alkaline phosphatase (APISO): 54 U/L (ref 37–153)
BUN: 14 mg/dL (ref 7–25)
CO2: 28 mmol/L (ref 20–32)
Calcium: 9.9 mg/dL (ref 8.6–10.4)
Chloride: 105 mmol/L (ref 98–110)
Creat: 0.91 mg/dL (ref 0.50–0.99)
GFR, Est African American: 78 mL/min/{1.73_m2} (ref >=60)
GFR, Est Non African American: 67 mL/min/{1.73_m2} (ref >=60)
Globulin: 2.7 g/dL (calc) (ref 1.9–3.7)
Glucose, Bld: 123 mg/dL — ABNORMAL HIGH (ref 65–99)
Potassium: 4.5 mmol/L (ref 3.5–5.3)
Sodium: 141 mmol/L (ref 135–146)
Total Bilirubin: 0.4 mg/dL (ref 0.2–1.2)
Total Protein: 6.7 g/dL (ref 6.1–8.1)

## 2019-12-05 LAB — CBC WITH DIFFERENTIAL/PLATELET
Absolute Monocytes: 230 cells/uL (ref 200–950)
Basophils Absolute: 22 cells/uL (ref 0–200)
Basophils Relative: 0.6 %
Eosinophils Absolute: 79 cells/uL (ref 15–500)
Eosinophils Relative: 2.2 %
HCT: 36.9 % (ref 35.0–45.0)
Hemoglobin: 13.1 g/dL (ref 11.7–15.5)
Lymphs Abs: 1886 cells/uL (ref 850–3900)
MCH: 34.8 pg — ABNORMAL HIGH (ref 27.0–33.0)
MCHC: 35.5 g/dL (ref 32.0–36.0)
MCV: 98.1 fL (ref 80.0–100.0)
MPV: 10.5 fL (ref 7.5–12.5)
Monocytes Relative: 6.4 %
Neutro Abs: 1382 cells/uL — ABNORMAL LOW (ref 1500–7800)
Neutrophils Relative %: 38.4 %
Platelets: 187 10*3/uL (ref 140–400)
RBC: 3.76 10*6/uL — ABNORMAL LOW (ref 3.80–5.10)
RDW: 12.7 % (ref 11.0–15.0)
Total Lymphocyte: 52.4 %
WBC: 3.6 10*3/uL — ABNORMAL LOW (ref 3.8–10.8)

## 2019-12-05 LAB — HEPATITIS C GENOTYPE

## 2019-12-05 LAB — HIV-1 RNA QUANT-NO REFLEX-BLD
HIV 1 RNA Quant: <20 copies/mL — AB
HIV-1 RNA Quant, Log: <1.3 Log copies/mL — AB

## 2019-12-05 LAB — RPR: RPR Ser Ql: NONREACTIVE

## 2019-12-09 ENCOUNTER — Ambulatory Visit (HOSPITAL_COMMUNITY): Payer: Medicaid Other

## 2019-12-11 ENCOUNTER — Ambulatory Visit (HOSPITAL_COMMUNITY)
Admission: RE | Admit: 2019-12-11 | Discharge: 2019-12-11 | Disposition: A | Discharge status: Home / Self Care | Payer: Medicaid Other | Source: Ambulatory Visit | Attending: Internal Medicine | Admitting: Internal Medicine

## 2019-12-11 ENCOUNTER — Other Ambulatory Visit: Payer: Self-pay

## 2019-12-11 DIAGNOSIS — B182 Chronic viral hepatitis C: Secondary | ICD-10-CM

## 2019-12-11 DIAGNOSIS — B20 Human immunodeficiency virus [HIV] disease: Secondary | ICD-10-CM

## 2019-12-15 ENCOUNTER — Telehealth: Payer: Self-pay | Admitting: Pharmacy Technician

## 2019-12-15 ENCOUNTER — Other Ambulatory Visit: Payer: Self-pay | Admitting: Internal Medicine

## 2019-12-15 DIAGNOSIS — B182 Chronic viral hepatitis C: Secondary | ICD-10-CM

## 2019-12-15 DIAGNOSIS — B20 Human immunodeficiency virus [HIV] disease: Secondary | ICD-10-CM

## 2019-12-15 MED ORDER — LEDIPASVIR-SOFOSBUVIR 90-400 MG PO TABS: 1.0000 | ORAL_TABLET | Freq: Every day | ORAL | 2 refills | Status: DC

## 2019-12-15 NOTE — Telephone Encounter (Addendum)
[facility] Patient Advocate Encounter   Received notification from [facility] MEDICAID that prior authorization for Harvoni is required.   PA submitted on 12/15/2019 Key [id number] W Status is DENIED    We will resubmit with clinical notes/letter.   Netty Starring. Dimas Aguas CPhT Specialty Pharmacy Patient [facility] for Infectious Disease Phone: [phone] Fax:  [phone]

## 2019-12-18 ENCOUNTER — Telehealth: Payer: Self-pay | Admitting: Pharmacist

## 2019-12-18 NOTE — Telephone Encounter (Signed)
Wrote an appeal for patient's Harvoni medication through Medicaid. Lindsey Leon will submit today.

## 2019-12-22 NOTE — Telephone Encounter (Signed)
[facility] Patient Advocate Encounter   Resubmitted prior authorization to [facility] Medicaid for Harvoni with providers clinical letter of explanation.   PA submitted on 12/22/2019 Key [id number] W Status is pending    [facility] Clinic will continue to follow.   Netty Starring. Dimas Aguas CPhT Specialty Pharmacy Patient [facility] for Infectious Disease Phone: [phone] Fax:  [phone]

## 2019-12-23 MED FILL — BIKTARVY 50-200-25 MG TABS: 50-200-25 | 30 days supply | Qty: 30 | Fill #0

## 2019-12-23 NOTE — Telephone Encounter (Signed)
[facility] Patient Advocate Encounter   Received notification from [employer] that prior authorization for Harvoni generic is required. The previous application submitted on [place] [employer] has been denied.   PA submitted on 12/23/2019 The representative I spoke to at [facility] said Harvoni requires a special application that has to be completed and faxed to [phone]. This was sent today.  Status is pending. Pending 24 hours for determination. Appeal letter was sent with the labs and new form.    [facility] Clinic will continue to follow.  Beulah Gandy, CPhT Specialty Pharmacy Patient [facility] for Infectious Disease Phone: [phone] Fax: [phone] 12/23/2019 12:04 PM

## 2019-12-29 NOTE — Telephone Encounter (Addendum)
[facility] Patient Advocate Encounter   Resubmitted prior authorization for Harvoni.   PA submitted on 12/29/2019 and 12/30/2019 Status is APPROVED  [id number] 12/31/2019 through 02/29/2020   Kathie Rhodes E. Dimas Aguas CPhT Specialty Pharmacy Patient [facility] for Infectious Disease Phone: [phone] Fax:  [phone]

## 2019-12-31 ENCOUNTER — Other Ambulatory Visit: Payer: Self-pay | Admitting: Pharmacist

## 2019-12-31 ENCOUNTER — Encounter: Payer: Self-pay | Admitting: Pharmacy Technician

## 2019-12-31 DIAGNOSIS — B182 Chronic viral hepatitis C: Secondary | ICD-10-CM

## 2019-12-31 MED ORDER — LEDIPASVIR-SOFOSBUVIR 90-400 MG PO TABS: 1.0000 | ORAL_TABLET | Freq: Every day | ORAL | 2 refills | Status: DC

## 2019-12-31 MED FILL — LEDIPASVIR-SOFOSBUVIR 90-40: 90-400 | 28 days supply | Qty: 28 | Fill #0

## 2019-12-31 NOTE — Telephone Encounter (Addendum)
[facility] Patient Advocate Encounter  Labs will be drawn in 4 weeks from start date of Harvoni through [facility] where she resides.  [name] RN at [facility] is coordinating.  They will fax results to [place].  Lindsey Leon. Dimas Aguas CPhT Specialty Pharmacy Patient [facility] for Infectious Disease Phone: [phone] Fax:  [phone]

## 2019-12-31 NOTE — Telephone Encounter (Signed)
[facility] Patient Advocate Encounter  Prior Authorization for Hassell Halim has been approved.    PA# [id number] Effective dates: 12/31/2019 through 02/29/2020  Patients co-pay is $3.   [facility] Clinic will continue to follow.  Netty Starring. Dimas Aguas CPhT Specialty Pharmacy Patient [facility] for Infectious Disease Phone: [phone] Fax:  [phone]

## 2020-01-26 MED FILL — LEDIPASVIR-SOFOSBUVIR 90-40: 90-400 | 28 days supply | Qty: 28 | Fill #1

## 2020-01-26 MED FILL — BIKTARVY 50-200-25 MG TABS: 50-200-25 | 30 days supply | Qty: 30 | Fill #1

## 2020-03-01 ENCOUNTER — Telehealth: Payer: Self-pay | Admitting: Pharmacy Technician

## 2020-03-01 MED FILL — BIKTARVY 50-200-25 MG TABS: 50-200-25 | 30 days supply | Qty: 30 | Fill #2

## 2020-03-01 NOTE — Telephone Encounter (Addendum)
[facility] Patient Advocate Encounter  [facility] have orders to run HCV labs and fax to [place] as well as the prescription.  We need them to get needed PA for last 4 weeks of generic Harvoni.  I spoke with Traci who said she will fax them to [place] at [phone].  If they are not completed, I let her know that Lindsey Leon can get them drawn here at the clinic.  They faxed the needed labs so that I can get approval for the final 4 weeks.  The nurse let me know that the order had been stopped, someone at [facility] thought the medication was completed.  I let them know that she needs a final 4 weeks.  She said she will restart the order.  I asked how many days had been missed total and she thought 2-3 days at the most.    Kathie Rhodes E. Dimas Aguas CPhT Specialty Pharmacy Patient [facility] for Infectious Disease Phone: [phone] Fax:  [phone]

## 2020-03-01 NOTE — Telephone Encounter (Addendum)
[facility] Patient Advocate Encounter   Received notification from [facility] MEDICAID that prior authorization for HARVONI generic is required.   PA submitted on 03/01/2020 Status is APPROVED  Confirmation # [id number]     Netty Starring. Dimas Aguas CPhT Specialty Pharmacy Patient [facility] for Infectious Disease Phone: [phone] Fax:  [phone]

## 2020-03-02 MED FILL — LEDIPASVIR-SOFOSBUVIR 90-40: 90-400 | 28 days supply | Qty: 28 | Fill #2

## 2020-03-29 ENCOUNTER — Other Ambulatory Visit: Payer: Self-pay | Admitting: Internal Medicine

## 2020-03-29 DIAGNOSIS — B182 Chronic viral hepatitis C: Secondary | ICD-10-CM

## 2020-03-29 MED FILL — BIKTARVY 50-200-25 MG TABS: 50-200-25 | 30 days supply | Qty: 30 | Fill #3

## 2020-04-29 MED FILL — BIKTARVY 50-200-25 MG TABS: 50-200-25 | 30 days supply | Qty: 30 | Fill #4

## 2020-05-19 ENCOUNTER — Ambulatory Visit: Payer: Medicaid Other | Admitting: Internal Medicine

## 2020-05-24 ENCOUNTER — Ambulatory Visit: Payer: Medicaid Other | Admitting: Internal Medicine

## 2020-05-25 ENCOUNTER — Ambulatory Visit: Payer: Medicaid Other | Admitting: Internal Medicine

## 2020-06-07 MED FILL — BIKTARVY 50-200-25 MG TABS: 50-200-25 | 30 days supply | Qty: 30 | Fill #5

## 2020-06-30 ENCOUNTER — Other Ambulatory Visit: Payer: Self-pay

## 2020-06-30 ENCOUNTER — Ambulatory Visit (INDEPENDENT_AMBULATORY_CARE_PROVIDER_SITE_OTHER): Payer: Medicaid Other | Admitting: Internal Medicine

## 2020-06-30 ENCOUNTER — Encounter: Payer: Self-pay | Admitting: Internal Medicine

## 2020-06-30 VITALS — BP 189/83 | HR 82 | Wt 179.0 lb

## 2020-06-30 DIAGNOSIS — Z113 Encounter for screening for infections with a predominantly sexual mode of transmission: Secondary | ICD-10-CM | POA: Diagnosis not present

## 2020-06-30 DIAGNOSIS — Z9189 Other specified personal risk factors, not elsewhere classified: Secondary | ICD-10-CM

## 2020-06-30 DIAGNOSIS — B20 Human immunodeficiency virus [HIV] disease: Secondary | ICD-10-CM

## 2020-06-30 DIAGNOSIS — B182 Chronic viral hepatitis C: Secondary | ICD-10-CM | POA: Diagnosis present

## 2020-06-30 NOTE — Assessment & Plan Note (Signed)
Her CD4 has remained good so will stop the Bactrim today unless concerns on today's labs.

## 2020-06-30 NOTE — Assessment & Plan Note (Addendum)
She completed treatment about 3 months ago so will check SVR12 today.  elastography without significant concerns.

## 2020-06-30 NOTE — Assessment & Plan Note (Addendum)
She continues to do well with the Biktarvy  No changes and rtc in 6 months.  She reports receiving her flu and pneumonia shots at her facility

## 2020-06-30 NOTE — Progress Notes (Signed)
   Subjective:    Patient ID: DONELL SLIWINSKI, female    DOB: 23-Dec-1955, 64 y.o.   MRN: [id number]  HPI Here for follow up of HIV and chronic hepatitis C.  She continues on Biktarvy and no missed doses.  Medication is provided to her by the facility.  She also started and completed Epclusa for hepatitis C treatment.  No issues during that treatment though did have a delay in one of the refills. No new issues.  Here with her sister.  Elastography done last visit and < 9 kPa so not significantly concerning for advanced liver disease.    Review of Systems  Constitutional: Negative for fatigue.  Gastrointestinal: Negative for diarrhea and nausea.  Skin: Negative for rash.       Objective:   Physical Exam Constitutional:      Appearance: Normal appearance.  Eyes:     General: No scleral icterus. Pulmonary:     Effort: Pulmonary effort is normal.  Skin:    Findings: No rash.  Neurological:     Mental Status: She is alert.  Psychiatric:        Mood and Affect: Mood normal.   SH: continues to live at the same facility        Assessment & Plan:

## 2020-07-01 LAB — T-HELPER CELL (CD4) - (RCID CLINIC ONLY)
CD4 % Helper T Cell: 23 % — ABNORMAL LOW (ref 33–65)
CD4 T Cell Abs: 467 /uL (ref 400–1790)

## 2020-07-05 LAB — HIV-1 RNA QUANT-NO REFLEX-BLD
HIV 1 RNA Quant: <20 Copies/mL — ABNORMAL HIGH
HIV-1 RNA Quant, Log: <1.3 Log cps/mL — ABNORMAL HIGH

## 2020-07-05 LAB — HEPATITIS C RNA QUANTITATIVE
HCV RNA, PCR, QN (Log): <1.18 log IU/mL
HCV RNA, PCR, QN: <15 IU/mL

## 2020-07-05 MED FILL — BIKTARVY 50-200-25 MG TABS: 50-200-25 | 30 days supply | Qty: 30 | Fill #6

## 2020-07-20 ENCOUNTER — Telehealth: Payer: Self-pay

## 2020-07-20 NOTE — Telephone Encounter (Signed)
Patient called asking about an HIV medication she saw on TV that "doesn't cure it, but you can have sex," and wanted to know if she was on that medication. Patient could not remember the name of the medication she saw on TV. RN explained to the patient that she is currently on Biktarvy and her viral load is undetectable. RN reinforced U=U, patient has no further questions.   Sandie Ano, RN

## 2020-07-30 ENCOUNTER — Other Ambulatory Visit: Payer: Self-pay | Admitting: Internal Medicine

## 2020-07-30 DIAGNOSIS — B20 Human immunodeficiency virus [HIV] disease: Secondary | ICD-10-CM

## 2020-08-05 MED FILL — BIKTARVY 50-200-25 MG TABS: 50-200-25 | 30 days supply | Qty: 30 | Fill #0

## 2020-09-13 MED FILL — BIKTARVY 50-200-25 MG TABS: 50-200-25 | 30 days supply | Qty: 30 | Fill #1

## 2020-09-16 ENCOUNTER — Other Ambulatory Visit: Payer: Self-pay

## 2020-09-16 ENCOUNTER — Emergency Department (HOSPITAL_COMMUNITY): Payer: Medicaid Other

## 2020-09-16 ENCOUNTER — Emergency Department (HOSPITAL_COMMUNITY)
Admission: EM | Admit: 2020-09-16 | Discharge: 2020-09-17 | Disposition: A | Discharge status: Home / Self Care | Payer: Medicaid Other | Attending: Emergency Medicine | Admitting: Emergency Medicine

## 2020-09-16 DIAGNOSIS — Z8673 Personal history of transient ischemic attack (TIA), and cerebral infarction without residual deficits: Secondary | ICD-10-CM | POA: Insufficient documentation

## 2020-09-16 DIAGNOSIS — F0390 Unspecified dementia without behavioral disturbance: Secondary | ICD-10-CM | POA: Insufficient documentation

## 2020-09-16 DIAGNOSIS — Z7902 Long term (current) use of antithrombotics/antiplatelets: Secondary | ICD-10-CM | POA: Insufficient documentation

## 2020-09-16 DIAGNOSIS — G459 Transient cerebral ischemic attack, unspecified: Secondary | ICD-10-CM | POA: Diagnosis not present

## 2020-09-16 DIAGNOSIS — Z7901 Long term (current) use of anticoagulants: Secondary | ICD-10-CM | POA: Diagnosis not present

## 2020-09-16 DIAGNOSIS — Z9889 Other specified postprocedural states: Secondary | ICD-10-CM | POA: Diagnosis not present

## 2020-09-16 DIAGNOSIS — R200 Anesthesia of skin: Secondary | ICD-10-CM | POA: Diagnosis present

## 2020-09-16 DIAGNOSIS — R479 Unspecified speech disturbances: Secondary | ICD-10-CM | POA: Insufficient documentation

## 2020-09-16 DIAGNOSIS — E119 Type 2 diabetes mellitus without complications: Secondary | ICD-10-CM | POA: Insufficient documentation

## 2020-09-16 DIAGNOSIS — Z87891 Personal history of nicotine dependence: Secondary | ICD-10-CM | POA: Diagnosis not present

## 2020-09-16 DIAGNOSIS — I10 Essential (primary) hypertension: Secondary | ICD-10-CM | POA: Insufficient documentation

## 2020-09-16 DIAGNOSIS — Z794 Long term (current) use of insulin: Secondary | ICD-10-CM | POA: Insufficient documentation

## 2020-09-16 LAB — CBC WITH DIFFERENTIAL/PLATELET
Abs Immature Granulocytes: 0.02 10*3/uL (ref 0.00–0.07)
Basophils Absolute: 0 10*3/uL (ref 0.0–0.1)
Basophils Relative: 1 %
Eosinophils Absolute: 0.1 10*3/uL (ref 0.0–0.5)
Eosinophils Relative: 2 %
HCT: 41.1 % (ref 36.0–46.0)
Hemoglobin: 12.6 g/dL (ref 12.0–15.0)
Immature Granulocytes: 0 %
Lymphocytes Relative: 32 %
Lymphs Abs: 1.9 10*3/uL (ref 0.7–4.0)
MCH: 30.4 pg (ref 26.0–34.0)
MCHC: 30.7 g/dL (ref 30.0–36.0)
MCV: 99.3 fL (ref 80.0–100.0)
Monocytes Absolute: 0.5 10*3/uL (ref 0.1–1.0)
Monocytes Relative: 8 %
Neutro Abs: 3.4 10*3/uL (ref 1.7–7.7)
Neutrophils Relative %: 57 %
Platelets: 264 10*3/uL (ref 150–400)
RBC: 4.14 MIL/uL (ref 3.87–5.11)
RDW: 13.5 % (ref 11.5–15.5)
WBC: 5.9 10*3/uL (ref 4.0–10.5)
nRBC: 0 % (ref 0.0–0.2)

## 2020-09-16 LAB — COMPREHENSIVE METABOLIC PANEL
ALT: 19 U/L (ref 0–44)
AST: 28 U/L (ref 15–41)
Albumin: 4.2 g/dL (ref 3.5–5.0)
Alkaline Phosphatase: 45 U/L (ref 38–126)
Anion gap: 16 — ABNORMAL HIGH (ref 5–15)
BUN: 12 mg/dL (ref 8–23)
CO2: 22 mmol/L (ref 22–32)
Calcium: 9.9 mg/dL (ref 8.9–10.3)
Chloride: 106 mmol/L (ref 98–111)
Creatinine, Ser: 0.96 mg/dL (ref 0.44–1.00)
GFR, Estimated: >60 mL/min (ref >60)
Glucose, Bld: 100 mg/dL — ABNORMAL HIGH (ref 70–99)
Potassium: 3.9 mmol/L (ref 3.5–5.1)
Sodium: 144 mmol/L (ref 135–145)
Total Bilirubin: 0.6 mg/dL (ref 0.3–1.2)
Total Protein: 7.3 g/dL (ref 6.5–8.1)

## 2020-09-16 LAB — URINALYSIS, ROUTINE W REFLEX MICROSCOPIC
Bilirubin Urine: NEGATIVE
Glucose, UA: NEGATIVE mg/dL
Hgb urine dipstick: NEGATIVE
Ketones, ur: NEGATIVE mg/dL
Leukocytes,Ua: NEGATIVE
Nitrite: NEGATIVE
Protein, ur: NEGATIVE mg/dL
Specific Gravity, Urine: >1.046 — ABNORMAL HIGH (ref 1.005–1.030)
pH: 6 (ref 5.0–8.0)

## 2020-09-16 LAB — AMMONIA: Ammonia: 32 umol/L (ref 9–35)

## 2020-09-16 LAB — ETHANOL: Alcohol, Ethyl (B): <10 mg/dL (ref <10)

## 2020-09-16 MED ORDER — IOHEXOL 350 MG/ML SOLN
75.0000 mL | Freq: Once | INTRAVENOUS | Status: AC | PRN
  Administered 2020-09-16: 75 mL via INTRAVENOUS

## 2020-09-16 MED ORDER — SODIUM CHLORIDE 0.9 % IV BOLUS
500.0000 mL | Freq: Once | INTRAVENOUS | Status: AC
  Administered 2020-09-16: 500 mL via INTRAVENOUS

## 2020-09-16 NOTE — ED Notes (Signed)
Patient transported to CT 

## 2020-09-16 NOTE — Discharge Instructions (Signed)
Continue taking home medications as prescribed. It is very important that you follow-up with your neurologist at your scheduled appointment on the 15th. Return to the emergency room if any new symptoms including weakness, speech deficits, vision changes, any worsening or concerning symptoms.

## 2020-09-16 NOTE — ED Notes (Signed)
[facility] called  

## 2020-09-16 NOTE — ED Notes (Signed)
Provided with paper scrubs at this time.

## 2020-09-16 NOTE — ED Notes (Signed)
Patient transported to MRI 

## 2020-09-16 NOTE — ED Provider Notes (Signed)
[facility] EMERGENCY DEPARTMENT Provider Note   CSN: [id number] Arrival date & time:        History No chief complaint on file.   Lindsey Leon is a 64 y.o. female presenting for evaluation of right hand numbness.  Patient states she was at her facility about to have lunch when she felt like her right hand went numb and she could not move it well.  This lasted for about 10 minutes before resolving with intervention.  She was worried she was having a stroke, thus EMS was called.  She states during that time, she had a mild headache and felt like her words were not coming out right.  This is also all resolved.  She symptom-free at this time.  She states 5 days ago she had an episode where she could not get her words out right, but was not evaluated at that time.  She denies recent medication changes.  She denies recent fevers, chills, chest pain or cough, nausea, vomiting abdominal pain currently in her symptoms, normal bowel movements.  She states she is taking all her medications as prescribed, including her HIV medications.  Her CD4 count is good.  Additional history obtained per chart review.  History of hepatitis, diverticulitis, HIV, hypertension, stroke, dementia, recent septal myectomy with subsequent post op cva.   HPI     Past Medical History:  Diagnosis Date  . Chronic hepatitis C (HCC)    dx'ed in 12/2018  . Diverticulitis   . HIV infection (HCC)    dx'ed in 12/2018  . Hypertension    not on medications  . Pancreatitis   . Stroke [facility])     Patient Active Problem List   Diagnosis Date Noted  . Routine screening for STI (sexually transmitted infection) 12/01/2019  . At risk for opportunistic infections 12/01/2019  . Type 2 diabetes mellitus with hyperglycemia (HCC) 02/2019  . Dementia (HCC) 12/27/2018  . Human immunodeficiency virus (HIV) disease (HCC) 12/25/2018  . Carotid artery occlusion 12/25/2018  . CVA (cerebral vascular accident) (HCC)  12/25/2018  . Chronic hepatitis C without hepatic coma (HCC) 12/2018  . TIA (transient ischemic attack) 12/06/2018  . ICH (intracerebral hemorrhage) (HCC) 05/23/2014  . Diverticulitis of colon 02/24/2013  . Pancreatitis, Hx of 02/24/2013  . Alcohol abuse 02/24/2013    Past Surgical History:  Procedure Laterality Date  . FLEXIBLE SIGMOIDOSCOPY N/A 02/26/2013   Procedure: FLEXIBLE SIGMOIDOSCOPY;  Surgeon: Theda Belfast, MD;  Location: [facility] ENDOSCOPY;  Service: Endoscopy;  Laterality: N/A;     OB History   No obstetric history on file.     Family History  Problem Relation Age of Onset  . Healthy Father   . Cancer Mother        passed away. unknown age  . Hypertension Sister   . Diabetes Mellitus II Sister     Social History   Tobacco Use  . Smoking status: Former Smoker    Packs/day: 0.50    Years: 30.00    Pack years: 15.00    Types: Cigarettes    Quit date: 05/22/2015    Years since quitting: 5.3  . Smokeless tobacco: Never Used  Vaping Use  . Vaping Use: Never used  Substance Use Topics  . Alcohol use: No    Alcohol/week: 0.0 standard drinks  . Drug use: Not Currently    Types: Marijuana    Home Medications Prior to Admission medications   Medication Sig Start Date End Date Taking?  Authorizing Provider  apixaban (ELIQUIS) 2.5 MG TABS tablet Take 2.5 mg by mouth 2 (two) times daily.    [provider]  BIKTARVY 50-200-25 MG TABS tablet TAKE 1 TABLET BY MOUTH DAILY. 07/30/20   Comer, Belia Heman, MD  cilostazol (PLETAL) 50 MG tablet Take 50 mg by mouth 2 (two) times daily.    [provider]  clopidogrel (PLAVIX) 75 MG tablet Take 1 tablet (75 mg total) by mouth daily. 01/01/19   Masoudi, Elhamalsadat, MD  insulin glargine (LANTUS) 100 UNIT/ML injection Inject into the skin daily.    [provider]  Losartan Potassium (COZAAR PO) Take by mouth.    [provider]  Polyethylene Glycol 3350 (MIRALAX PO) Take by mouth.    [provider]  thiamine 100 MG tablet Take 100 mg by mouth daily.    [provider]    Allergies    Patient has no known allergies.  Review of Systems   Review of Systems  Neurological: Positive for speech difficulty (resolved) and numbness (R hand, resolved).  All other systems reviewed and are negative.   Physical Exam Updated Vital Signs BP (!) 144/61   Pulse 72   Temp 98.1 F (36.7 C) (Oral)   Resp 20   Ht 5\' 6"  (1.676 m)   Wt 75.8 kg   SpO2 98%   BMI 26.95 kg/m   Physical Exam Vitals and nursing note reviewed.  Constitutional:      General: She is not in acute distress.    Appearance: She is well-developed.     Comments: Appears nontoxic  HENT:     Head: Normocephalic and atraumatic.  Eyes:     Extraocular Movements: Extraocular movements intact.     Conjunctiva/sclera: Conjunctivae normal.     Pupils: Pupils are equal, round, and reactive to light.  Cardiovascular:     Rate and Rhythm: Normal rate and regular rhythm.     Pulses: Normal pulses.  Pulmonary:     Effort: Pulmonary effort is normal. No respiratory distress.     Breath sounds: Normal breath sounds. No wheezing.  Abdominal:     General: There is no distension.     Palpations: Abdomen is soft. There is no mass.     Tenderness: There is no abdominal tenderness. There is no guarding or rebound.  Musculoskeletal:        General: Normal range of motion.     Cervical back: Normal range of motion and neck supple.  Skin:    General: Skin is warm and dry.     Capillary Refill: Capillary refill takes less than 2 seconds.  Neurological:     Mental Status: She is alert and oriented to person, place, and time.     GCS: GCS eye subscore is 4. GCS verbal subscore is 5. GCS motor subscore is 6.     Cranial Nerves: Cranial nerves are intact.     Sensory: Sensation is intact.     Motor: Motor function is intact. No pronator drift.     Coordination: Coordination is intact.     Comments: No focal  neuro deficits     ED Results / Procedures / Treatments   Labs (all labs ordered are listed, but only abnormal results are displayed) Labs Reviewed  COMPREHENSIVE METABOLIC PANEL - Abnormal; Notable for the following components:      Result Value   Glucose, Bld 100 (*)    Anion gap 16 (*)    All other  components within normal limits  CBC WITH DIFFERENTIAL/PLATELET  ETHANOL  AMMONIA  URINALYSIS, ROUTINE W REFLEX MICROSCOPIC    EKG None  Radiology CT Head Wo Contrast  Result Date: 09/16/2020 CLINICAL DATA:  Pt arrived by EMS from [place] [facility] facility concerned that she is having a stroke. Pt is stating she is having a stroke but has not symptoms and is not complaining. EXAM: CT HEAD WITHOUT CONTRAST TECHNIQUE: Contiguous axial images were obtained from the base of the skull through the vertex without intravenous contrast. COMPARISON:  Brain MRI and head CT, 12/24/2018 FINDINGS: Brain: No evidence of acute infarction, hemorrhage, hydrocephalus, extra-axial collection or mass lesion/mass effect. Ventricles and sulci enlarged reflecting diffuse atrophy. Bilateral white matter hypoattenuation is noted consistent with moderate to advanced chronic microvascular ischemic change. Old right thalamic and probable small old left thalamic and right basal ganglia lacunar infarcts. Probable old pontine lacunar infarct. Vascular: No hyperdense vessel or unexpected calcification. Skull: Normal. Negative for fracture or focal lesion. Sinuses/Orbits: Globes and orbits are unremarkable. Visualized sinuses are clear. Other: None. IMPRESSION: 1. No acute intracranial abnormalities. 2. Atrophy, chronic microvascular ischemic change and old lacunar infarcts stable compared to the prior exams. Electronically Signed   By: Amie Portland M.D.   On: 09/16/2020 15:42    Procedures Procedures (including critical care time)  Medications Ordered in ED Medications - No data to display  ED Course  I have  reviewed the triage vital signs and the nursing notes.  Pertinent labs & imaging results that were available during my care of the patient were reviewed by me and considered in my medical decision making (see chart for details).    MDM Rules/Calculators/A&P                          Patient resenting for evaluation of right hand numbness and speech difficulty.  On exam, symptoms have resolved, and she has no neuro deficits.  She appears nontoxic.  However she does have high risk for stroke, recently had a postop stroke and recent cardiac procedure.  She has known vascular disease of the carotids.  She will need labs and imaging of the head.  Discussed with Dr. Wilford Corner from neurology, recommends CTA head and neck as well as MRI.  Labs interpreted by me, overall reassuring.  No obvious metabolic cause for symptoms.  CT head negative for bleed or acute findings.  CTA shows chronic occlusion without acute findings.  MRI pending.  MRI negative for acute findings.  Urine pending.  Will need to check to ensure no infection that could cause recrudescence of strokelike symptoms.  If negative, plan for discharge and outpatient neuro follow-up.  Pt signed out to Peterson Lombard, MD for f/u on urine.   Final Clinical Impression(s) / ED Diagnoses Final diagnoses:  None    Rx / DC Orders ED Discharge Orders    None       Alveria Apley, PA-C 09/16/20 2143    Bethann Berkshire, MD 09/19/20 [phone]

## 2020-09-16 NOTE — ED Triage Notes (Signed)
Pt arrived by EMS from [place] re[facility] facility concerned that she is having a stroke.  Pt is stating she is having a stroke but has not symptoms and is not complaining. On arrival pt states she now feels better Pt ambulatory to room from stretcher.  Negative stroke screening   Hx of stroke and open heart surgery   Medic VS 140/66 70 HR  15RR  98% RA 98.1 Temp

## 2020-09-16 NOTE — ED Notes (Signed)
Pt needs [facility] transport back to facility. Report given to Janine Limbo LPN of facility. Requested Diplomatic Services operational officer to facilitate informing [facility].

## 2020-09-16 NOTE — ED Notes (Signed)
Walked with pt to bathroom, with steady gait. Pt still unable to give urine sample at this time, provider made aware.

## 2020-09-17 NOTE — ED Notes (Signed)
[facility] arrived at bedside. Report given to staff. Discharge papers given to [facility] staff.

## 2020-09-18 LAB — URINE CULTURE: Culture: NO GROWTH

## 2020-10-18 MED FILL — BIKTARVY 50-200-25 MG TABS: 50-200-25 | 30 days supply | Qty: 30 | Fill #2

## 2020-11-25 MED FILL — BIKTARVY 50-200-25 MG TABS: 50-200-25 | 30 days supply | Qty: 30 | Fill #3

## 2020-11-29 ENCOUNTER — Telehealth: Payer: Self-pay

## 2020-11-29 NOTE — Telephone Encounter (Signed)
Received call today from [facility] with SNF requesting number for pharmacy for patient medication. Provided number for pharmacy. Will call today for delivery. P: [phone]

## 2020-12-27 ENCOUNTER — Encounter: Payer: Self-pay | Admitting: Internal Medicine

## 2020-12-27 ENCOUNTER — Other Ambulatory Visit (HOSPITAL_COMMUNITY)
Admission: RE | Admit: 2020-12-27 | Discharge: 2020-12-27 | Disposition: A | Discharge status: Home / Self Care | Payer: Medicaid Other | Source: Ambulatory Visit | Attending: Internal Medicine | Admitting: Internal Medicine

## 2020-12-27 ENCOUNTER — Other Ambulatory Visit: Payer: Self-pay

## 2020-12-27 ENCOUNTER — Ambulatory Visit (INDEPENDENT_AMBULATORY_CARE_PROVIDER_SITE_OTHER): Payer: Medicaid Other | Admitting: Internal Medicine

## 2020-12-27 VITALS — BP 213/71 | HR 66 | Temp 98.3°F | Wt 172.4 lb

## 2020-12-27 DIAGNOSIS — B182 Chronic viral hepatitis C: Secondary | ICD-10-CM

## 2020-12-27 DIAGNOSIS — Z113 Encounter for screening for infections with a predominantly sexual mode of transmission: Secondary | ICD-10-CM | POA: Insufficient documentation

## 2020-12-27 DIAGNOSIS — B20 Human immunodeficiency virus [HIV] disease: Secondary | ICD-10-CM | POA: Diagnosis not present

## 2020-12-27 NOTE — Assessment & Plan Note (Signed)
She is now considered cured, no issues.

## 2020-12-27 NOTE — Assessment & Plan Note (Signed)
Doing well and no concerns.  She will rtc in 6 months.

## 2020-12-27 NOTE — Progress Notes (Signed)
   Subjective:    Patient ID: Lindsey Leon, female    DOB: [birth year], 65 y.o.   MRN: [id number]  HPI Here for follow up of HIV and chronic hepatitis C.   Here last visit was 6 months ago after completing hepatitis C treatment and her SVR 12 was negative, confirming cure.  She continues on [place] with no missed doses and feels well with no concerns.  She continues to live in a NH and gets her medications provided there.  No new concerns.    Review of Systems  Constitutional: Negative for fatigue.  Gastrointestinal: Negative for diarrhea and nausea.  Skin: Negative for rash.       Objective:   Physical Exam Eyes:     General: No scleral icterus. Cardiovascular:     Rate and Rhythm: Normal rate and regular rhythm.  Pulmonary:     Effort: Pulmonary effort is normal.  Neurological:     Mental Status: She is alert.  Psychiatric:        Mood and Affect: Mood normal.   SH: lives in a NH       Assessment & Plan:

## 2020-12-28 LAB — URINE CYTOLOGY ANCILLARY ONLY
Chlamydia: NEGATIVE
Comment: NEGATIVE
Comment: NORMAL
Neisseria Gonorrhea: NEGATIVE

## 2020-12-28 LAB — T-HELPER CELL (CD4) - (RCID CLINIC ONLY)
CD4 % Helper T Cell: 25 % — ABNORMAL LOW (ref 33–65)
CD4 T Cell Abs: 622 /uL (ref 400–1790)

## 2020-12-30 LAB — RPR: RPR Ser Ql: NONREACTIVE

## 2020-12-30 LAB — HIV-1 RNA QUANT-NO REFLEX-BLD
HIV 1 RNA Quant: NOT DETECTED Copies/mL
HIV-1 RNA Quant, Log: NOT DETECTED Log cps/mL

## 2021-01-01 ENCOUNTER — Other Ambulatory Visit: Payer: Self-pay

## 2021-01-01 ENCOUNTER — Encounter (HOSPITAL_COMMUNITY): Payer: Self-pay

## 2021-01-01 ENCOUNTER — Emergency Department (HOSPITAL_COMMUNITY): Payer: Medicaid Other

## 2021-01-01 ENCOUNTER — Inpatient Hospital Stay (HOSPITAL_COMMUNITY)
Admission: EM | Admit: 2021-01-01 | Discharge: 2021-01-03 | DRG: 065 | Disposition: A | Discharge status: Skilled Nursing Facility | Payer: Medicaid Other | Source: Skilled Nursing Facility | Attending: Internal Medicine | Admitting: Internal Medicine

## 2021-01-01 DIAGNOSIS — I679 Cerebrovascular disease, unspecified: Secondary | ICD-10-CM

## 2021-01-01 DIAGNOSIS — Z7902 Long term (current) use of antithrombotics/antiplatelets: Secondary | ICD-10-CM

## 2021-01-01 DIAGNOSIS — H539 Unspecified visual disturbance: Secondary | ICD-10-CM

## 2021-01-01 DIAGNOSIS — I639 Cerebral infarction, unspecified: Secondary | ICD-10-CM | POA: Diagnosis present

## 2021-01-01 DIAGNOSIS — B20 Human immunodeficiency virus [HIV] disease: Secondary | ICD-10-CM | POA: Diagnosis present

## 2021-01-01 DIAGNOSIS — H532 Diplopia: Secondary | ICD-10-CM

## 2021-01-01 DIAGNOSIS — H4902 Third [oculomotor] nerve palsy, left eye: Secondary | ICD-10-CM | POA: Diagnosis not present

## 2021-01-01 DIAGNOSIS — I2510 Atherosclerotic heart disease of native coronary artery without angina pectoris: Secondary | ICD-10-CM | POA: Diagnosis present

## 2021-01-01 DIAGNOSIS — E1140 Type 2 diabetes mellitus with diabetic neuropathy, unspecified: Secondary | ICD-10-CM | POA: Diagnosis present

## 2021-01-01 DIAGNOSIS — Z79899 Other long term (current) drug therapy: Secondary | ICD-10-CM

## 2021-01-01 DIAGNOSIS — Z20822 Contact with and (suspected) exposure to covid-19: Secondary | ICD-10-CM | POA: Diagnosis present

## 2021-01-01 DIAGNOSIS — Z8673 Personal history of transient ischemic attack (TIA), and cerebral infarction without residual deficits: Secondary | ICD-10-CM

## 2021-01-01 DIAGNOSIS — I10 Essential (primary) hypertension: Secondary | ICD-10-CM | POA: Diagnosis not present

## 2021-01-01 DIAGNOSIS — Z87891 Personal history of nicotine dependence: Secondary | ICD-10-CM

## 2021-01-01 DIAGNOSIS — Z8249 Family history of ischemic heart disease and other diseases of the circulatory system: Secondary | ICD-10-CM

## 2021-01-01 DIAGNOSIS — I6521 Occlusion and stenosis of right carotid artery: Secondary | ICD-10-CM | POA: Diagnosis present

## 2021-01-01 DIAGNOSIS — I160 Hypertensive urgency: Secondary | ICD-10-CM | POA: Diagnosis present

## 2021-01-01 DIAGNOSIS — Z951 Presence of aortocoronary bypass graft: Secondary | ICD-10-CM

## 2021-01-01 DIAGNOSIS — Z794 Long term (current) use of insulin: Secondary | ICD-10-CM

## 2021-01-01 DIAGNOSIS — I421 Obstructive hypertrophic cardiomyopathy: Secondary | ICD-10-CM | POA: Diagnosis present

## 2021-01-01 DIAGNOSIS — B182 Chronic viral hepatitis C: Secondary | ICD-10-CM | POA: Diagnosis present

## 2021-01-01 DIAGNOSIS — I6329 Cerebral infarction due to unspecified occlusion or stenosis of other precerebral arteries: Principal | ICD-10-CM | POA: Diagnosis present

## 2021-01-01 DIAGNOSIS — R29700 NIHSS score 0: Secondary | ICD-10-CM | POA: Diagnosis present

## 2021-01-01 DIAGNOSIS — Z833 Family history of diabetes mellitus: Secondary | ICD-10-CM

## 2021-01-01 LAB — COMPREHENSIVE METABOLIC PANEL
ALT: 19 U/L (ref 0–44)
AST: 20 U/L (ref 15–41)
Albumin: 4.2 g/dL (ref 3.5–5.0)
Alkaline Phosphatase: 46 U/L (ref 38–126)
Anion gap: 8 (ref 5–15)
BUN: 11 mg/dL (ref 8–23)
CO2: 27 mmol/L (ref 22–32)
Calcium: 9.9 mg/dL (ref 8.9–10.3)
Chloride: 105 mmol/L (ref 98–111)
Creatinine, Ser: 0.87 mg/dL (ref 0.44–1.00)
GFR, Estimated: >60 mL/min (ref >60)
Glucose, Bld: 102 mg/dL — ABNORMAL HIGH (ref 70–99)
Potassium: 4.1 mmol/L (ref 3.5–5.1)
Sodium: 140 mmol/L (ref 135–145)
Total Bilirubin: 0.8 mg/dL (ref 0.3–1.2)
Total Protein: 7.4 g/dL (ref 6.5–8.1)

## 2021-01-01 LAB — DIFFERENTIAL
Abs Immature Granulocytes: 0.01 10*3/uL (ref 0.00–0.07)
Basophils Absolute: 0 10*3/uL (ref 0.0–0.1)
Basophils Relative: 0 %
Eosinophils Absolute: 0.1 10*3/uL (ref 0.0–0.5)
Eosinophils Relative: 1 %
Immature Granulocytes: 0 %
Lymphocytes Relative: 38 %
Lymphs Abs: 2.4 10*3/uL (ref 0.7–4.0)
Monocytes Absolute: 0.5 10*3/uL (ref 0.1–1.0)
Monocytes Relative: 8 %
Neutro Abs: 3.3 10*3/uL (ref 1.7–7.7)
Neutrophils Relative %: 53 %

## 2021-01-01 LAB — PROTIME-INR
INR: 1.1 (ref 0.8–1.2)
Prothrombin Time: 13.8 seconds (ref 11.4–15.2)

## 2021-01-01 LAB — CBC
HCT: 37.2 % (ref 36.0–46.0)
Hemoglobin: 12.8 g/dL (ref 12.0–15.0)
MCH: 32.2 pg (ref 26.0–34.0)
MCHC: 34.4 g/dL (ref 30.0–36.0)
MCV: 93.5 fL (ref 80.0–100.0)
Platelets: 240 10*3/uL (ref 150–400)
RBC: 3.98 MIL/uL (ref 3.87–5.11)
RDW: 15 % (ref 11.5–15.5)
WBC: 6.2 10*3/uL (ref 4.0–10.5)
nRBC: 0 % (ref 0.0–0.2)

## 2021-01-01 LAB — APTT: aPTT: 33 seconds (ref 24–36)

## 2021-01-01 LAB — CBG MONITORING, ED: Glucose-Capillary: 92 mg/dL (ref 70–99)

## 2021-01-01 MED ORDER — CLOPIDOGREL BISULFATE 75 MG PO TABS
75.0000 mg | ORAL_TABLET | Freq: Every day | ORAL | Status: DC
  Administered 2021-01-02: 75 mg via ORAL
  Filled 2021-01-01: qty 1

## 2021-01-01 MED ORDER — LORAZEPAM 2 MG/ML IJ SOLN
0.5000 mg | Freq: Once | INTRAMUSCULAR | Status: AC | PRN
  Administered 2021-01-02: 0.5 mg via INTRAVENOUS
  Filled 2021-01-01: qty 1

## 2021-01-01 MED ORDER — ENOXAPARIN SODIUM 40 MG/0.4ML ~~LOC~~ SOLN
40.0000 mg | Freq: Every day | SUBCUTANEOUS | Status: DC
  Administered 2021-01-02 (×2): 40 mg via SUBCUTANEOUS
  Filled 2021-01-01 (×2): qty 0.4

## 2021-01-01 MED ORDER — INSULIN GLARGINE 100 UNIT/ML ~~LOC~~ SOLN: 13.0000 [IU] | Freq: Every day | SUBCUTANEOUS | Status: DC

## 2021-01-01 MED ORDER — LOSARTAN POTASSIUM 50 MG PO TABS
50.0000 mg | ORAL_TABLET | Freq: Every day | ORAL | Status: DC
  Administered 2021-01-01: 50 mg via ORAL
  Filled 2021-01-01: qty 1

## 2021-01-01 MED ORDER — INSULIN GLARGINE 100 UNIT/ML ~~LOC~~ SOLN
13.0000 [IU] | Freq: Every day | SUBCUTANEOUS | Status: DC
  Administered 2021-01-02 (×2): 13 [IU] via SUBCUTANEOUS
  Filled 2021-01-01 (×3): qty 0.13

## 2021-01-01 MED ORDER — ACETAMINOPHEN 160 MG/5ML PO SOLN: 650.0000 mg | ORAL | Status: DC | PRN

## 2021-01-01 MED ORDER — ACETAMINOPHEN 325 MG PO TABS: 650.0000 mg | ORAL_TABLET | ORAL | Status: DC | PRN

## 2021-01-01 MED ORDER — CLOPIDOGREL BISULFATE 75 MG PO TABS
75.0000 mg | ORAL_TABLET | Freq: Once | ORAL | Status: AC
  Administered 2021-01-01: 75 mg via ORAL
  Filled 2021-01-01: qty 1

## 2021-01-01 MED ORDER — IOHEXOL 350 MG/ML SOLN
75.0000 mL | Freq: Once | INTRAVENOUS | Status: AC | PRN
  Administered 2021-01-01: 75 mL via INTRAVENOUS

## 2021-01-01 MED ORDER — SODIUM CHLORIDE 0.9% FLUSH
3.0000 mL | Freq: Once | INTRAVENOUS | Status: DC
Start: 2021-01-01 — End: 2021-01-03

## 2021-01-01 MED ORDER — GABAPENTIN 300 MG PO CAPS
900.0000 mg | ORAL_CAPSULE | Freq: Every day | ORAL | Status: DC
  Administered 2021-01-01 – 2021-01-02 (×2): 900 mg via ORAL
  Filled 2021-01-01 (×2): qty 3

## 2021-01-01 MED ORDER — INSULIN ASPART 100 UNIT/ML ~~LOC~~ SOLN
0.0000 [IU] | Freq: Three times a day (TID) | SUBCUTANEOUS | Status: DC
  Administered 2021-01-03: 2 [IU] via SUBCUTANEOUS

## 2021-01-01 MED ORDER — SODIUM CHLORIDE 0.9 % IV SOLN: Freq: Once | INTRAVENOUS | Status: AC

## 2021-01-01 MED ORDER — INSULIN ASPART 100 UNIT/ML ~~LOC~~ SOLN: 0.0000 [IU] | Freq: Every day | SUBCUTANEOUS | Status: DC

## 2021-01-01 MED ORDER — ACETAMINOPHEN 650 MG RE SUPP: 650.0000 mg | RECTAL | Status: DC | PRN

## 2021-01-01 MED ORDER — STROKE: EARLY STAGES OF RECOVERY BOOK
Freq: Once | Status: AC
  Filled 2021-01-01 (×2): qty 1

## 2021-01-01 MED ORDER — METOPROLOL TARTRATE 25 MG PO TABS
50.0000 mg | ORAL_TABLET | Freq: Every day | ORAL | Status: DC
  Administered 2021-01-01: 50 mg via ORAL
  Filled 2021-01-01: qty 2

## 2021-01-01 NOTE — ED Notes (Signed)
ED Provider at bedside. 

## 2021-01-01 NOTE — ED Notes (Signed)
Unsuccessful IV x 2 attempts.

## 2021-01-01 NOTE — H&P (Addendum)
History and Physical    Lindsey Leon FYB:[id number] DOB: Jun 14, 1956 DOA: 01/01/2021  PCP: Default, Provider, MD Patient coming from: Home  Chief Complaint: Vision problem  HPI: Lindsey Leon is a 65 y.o. female with medical history significant of CAD status post CABG, HOCM, well-controlled HIV, chronic hep C, hypertension, pancreatitis, insulin-dependent diabetes, stroke presented to the ED for evaluation of acute onset double vision.  Patient states she woke up around 4 or 5 AM this morning to use the bathroom and noticed that she was having double vision in her left eye which then persisted throughout the day and has now resolved.  She did not have any weakness in her arm or leg did not have numbness anywhere.  Did not have difficulty speaking.  States she takes all of her home medications but her blood pressure is always high.  Patient has no other complaints.  Denies cough, shortness of breath, or chest pain.  She is fully vaccinated against COVID.  ED Course: Neurology consulted.  Hypertensive with systolic up to 527P.  Labs showing WBC 6.2, hemoglobin 12.8, platelet count 240K.  Sodium 140, potassium 4.1, chloride 105, bicarb 27, BUN 11, creatinine 0.8, glucose 102.  INR 1.1.  Head CT negative for acute finding. Patient was given Plavix 75 mg and her home antihypertensives including losartan and metoprolol.  Review of Systems:  All systems reviewed and apart from history of presenting illness, are negative.  Past Medical History:  Diagnosis Date  . Chronic hepatitis C ([place])    dx'ed in 12/2018  . Diverticulitis   . HIV infection ([place])    dx'ed in 12/2018  . Hypertension    not on medications  . Pancreatitis   . Stroke [facility])     Past Surgical History:  Procedure Laterality Date  . FLEXIBLE SIGMOIDOSCOPY N/A 02/26/2013   Procedure: FLEXIBLE SIGMOIDOSCOPY;  Surgeon: Beryle Beams, MD;  Location: [facility];  Service: Endoscopy;  Laterality: N/A;     reports that she quit  smoking about 5 years ago. Her smoking use included cigarettes. She has a 15.00 pack-year smoking history. She has never used smokeless tobacco. She reports previous drug use. Drug: Marijuana. She reports that she does not drink alcohol.  No Known Allergies  Family History  Problem Relation Age of Onset  . Healthy Father   . Cancer Mother        passed away. unknown age  . Hypertension Sister   . Diabetes Mellitus II Sister     Prior to Admission medications   Medication Sig Start Date End Date Taking? Authorizing Provider  acetaminophen (TYLENOL) 650 MG CR tablet Take 650 mg by mouth every 8 (eight) hours as needed for pain.    [provider]  atorvastatin (LIPITOR) 80 MG tablet Take 80 mg by mouth daily.    [provider]  BIKTARVY 50-200-25 MG TABS tablet TAKE 1 TABLET BY MOUTH DAILY. 07/30/20   Comer, Okey Regal, MD  calcium carbonate (TUMS - DOSED IN MG ELEMENTAL CALCIUM) 500 MG chewable tablet Chew 1 tablet by mouth 2 (two) times daily as needed for indigestion or heartburn.    [provider]  Cholecalciferol (VITAMIN D) 50 MCG (2000 UT) tablet Take 2,000 Units by mouth daily.    [provider]  clopidogrel (PLAVIX) 75 MG tablet Take 1 tablet (75 mg total) by mouth daily. 01/01/19   Masoudi, Dorthula Rue, MD  ferrous gluconate (FERGON) 324 MG tablet Take 324 mg by mouth daily with  breakfast.    [provider]  gabapentin (NEURONTIN) 100 MG capsule Take 100 mg by mouth at bedtime.    [provider]  gabapentin (NEURONTIN) 800 MG tablet Take 800 mg by mouth at bedtime.    [provider]  insulin glargine (LANTUS) 100 UNIT/ML injection Inject 13 Units into the skin at bedtime.    [provider]  lidocaine (LIDODERM) 5 % Place 1 patch onto the skin daily. Apply to right foot    [provider]  losartan (COZAAR) 50 MG tablet Take 50 mg by mouth daily.    [provider]  metoprolol tartrate  (LOPRESSOR) 50 MG tablet Take 50 mg by mouth daily.    [provider]  PRESCRIPTION MEDICATION 1 application daily as needed. Biofreeze 5% Apply to right foot daily for pain    [provider]  sennosides-docusate sodium (SENOKOT-S) 8.6-50 MG tablet Take 2 tablets by mouth at bedtime.    [provider]  thiamine (VITAMIN B-1) 100 MG tablet Take 100 mg by mouth every evening.    [provider]    Physical Exam: Vitals:   01/01/21 1915 01/01/21 2017 01/01/21 2100 01/01/21 2211  BP:  (!) 222/71 (!) 214/72 (!) 220/82  Pulse: 67 77 74 72  Resp: _0 Temp:      SpO2: 99% 95% 98%     Physical Exam Constitutional:      General: She is not in acute distress. HENT:     Head: Normocephalic and atraumatic.  Eyes:     Conjunctiva/sclera: Conjunctivae normal.  Cardiovascular:     Rate and Rhythm: Normal rate and regular rhythm.     Pulses: Normal pulses.     Heart sounds: Murmur heard.    Pulmonary:     Effort: Pulmonary effort is normal. No respiratory distress.     Breath sounds: Normal breath sounds. No wheezing or rales.  Abdominal:     General: Bowel sounds are normal. There is no distension.     Palpations: Abdomen is soft.     Tenderness: There is no abdominal tenderness.  Musculoskeletal:        General: No swelling or tenderness.     Cervical back: Normal range of motion and neck supple.  Skin:    General: Skin is warm and dry.  Neurological:     Mental Status: She is alert and oriented to person, place, and time.     Cranial Nerves: Cranial nerve deficit present.     Sensory: No sensory deficit.     Motor: No weakness.     Comments: Left eye down and out with ptosis     Labs on Admission: I have personally reviewed following labs and imaging studies  CBC: Recent Labs  Lab 01/01/21 1307  WBC 6.2  NEUTROABS 3.3  HGB 12.8  HCT 37.2  MCV 93.5  PLT 161   Basic Metabolic Panel: Recent Labs  Lab 01/01/21 1307  NA 140   K 4.1  CL 105  CO2 27  GLUCOSE 102*  BUN 11  CREATININE 0.87  CALCIUM 9.9   GFR: Estimated Creatinine Clearance: 69 mL/min (by C-G formula based on SCr of 0.87 mg/dL). Liver Function Tests: Recent Labs  Lab 01/01/21 1307  AST 20  ALT 19  ALKPHOS 46  BILITOT 0.8  PROT 7.4  ALBUMIN 4.2   No results for input(s): LIPASE, AMYLASE in the last 168 hours. No results for input(s): AMMONIA in the  last 168 hours. Coagulation Profile: Recent Labs  Lab 01/01/21 1307  INR 1.1   Cardiac Enzymes: No results for input(s): CKTOTAL, CKMB, CKMBINDEX, TROPONINI in the last 168 hours. BNP (last 3 results) No results for input(s): PROBNP in the last 8760 hours. HbA1C: No results for input(s): HGBA1C in the last 72 hours. CBG: Recent Labs  Lab 01/01/21 2247  GLUCAP 92   Lipid Profile: No results for input(s): CHOL, HDL, LDLCALC, TRIG, CHOLHDL, LDLDIRECT in the last 72 hours. Thyroid Function Tests: No results for input(s): TSH, T4TOTAL, FREET4, T3FREE, THYROIDAB in the last 72 hours. Anemia Panel: No results for input(s): VITAMINB12, FOLATE, FERRITIN, TIBC, IRON, RETICCTPCT in the last 72 hours. Urine analysis:    Component Value Date/Time   COLORURINE YELLOW 09/16/2020 2212   APPEARANCEUR CLEAR 09/16/2020 2212   LABSPEC >1.046 (H) 09/16/2020 2212   PHURINE 6.0 09/16/2020 2212   GLUCOSEU NEGATIVE 09/16/2020 2212   HGBUR NEGATIVE 09/16/2020 2212   BILIRUBINUR NEGATIVE 09/16/2020 2212   KETONESUR NEGATIVE 09/16/2020 2212   PROTEINUR NEGATIVE 09/16/2020 2212   UROBILINOGEN 0.2 08/11/2014 0046   NITRITE NEGATIVE 09/16/2020 2212   LEUKOCYTESUR NEGATIVE 09/16/2020 2212    Radiological Exams on Admission: CT HEAD WO CONTRAST  Result Date: 01/01/2021 CLINICAL DATA:  Neuro deficit, acute, stroke suspected. Additional provided: Patient reports blurred vision in left eye since 5 a.m. EXAM: CT HEAD WITHOUT CONTRAST TECHNIQUE: Contiguous axial images were obtained from the base of  the skull through the vertex without intravenous contrast. COMPARISON:  Brain MRI 09/16/2020. FINDINGS: Brain: Mild cerebral and cerebellar atrophy. Commensurate prominence of the ventricles and sulci. Redemonstrated chronic lacunar infarcts within the deep gray nuclei bilaterally. Redemonstrated chronic small vessel ischemic disease within the pons, including a chronic right pontine lacunar infarct. Advanced patchy and ill-defined hypoattenuation within the cerebral white matter is nonspecific, but compatible with chronic small vessel ischemic disease. There is no acute intracranial hemorrhage. No demarcated cortical infarct. No extra-axial fluid collection. No evidence of intracranial mass. No midline shift. Vascular: No hyperdense vessel.  Atherosclerotic calcifications Skull: Normal. Negative for fracture or focal lesion. Sinuses/Orbits: Visualized orbits show no acute finding. No significant paranasal sinus disease at the imaged levels. IMPRESSION: No evidence of acute intracranial abnormality. Redemonstrated chronic lacunar infarcts within the deep gray nuclei bilaterally. Redemonstrated severe cerebral white matter chronic small vessel ischemic disease. Chronic small vessel ischemic changes also again seen within the a pons, including a chronic right pontine lacunar infarct. Mild generalized parenchymal atrophy. Electronically Signed   By: Kellie Simmering DO   On: 01/01/2021 14:54    EKG: Independently reviewed.  Sinus rhythm, no acute changes.  Assessment/Plan Principal Problem:   Diplopia Active Problems:   Human immunodeficiency virus (HIV) disease (HCC)   Chronic hepatitis C without hepatic coma (HCC)   Cranial nerve III palsy, left   HTN (hypertension)   Acute onset diplopia, left cranial nerve III palsy: Patient here for evaluation of acute onset double vision which started this morning.  Neuro exam notable for left cranial nerve III palsy.  She was seen by neurology.  Head CT negative for  acute finding.  CTA head and neck was done and radiologist read not uploaded in the system yet but reviewed by neurology and no LVO seen.  Neurology feels that this could be isolated ischemic cranial nerve III palsy due to poorly controlled hypertension.  However, given stroke risk factors and history of multifocal multivessel stenosis and right carotid artery occlusion, admitted for stroke work-up. -  Telemetry monitoring -MRI of the brain with and without contrast -ESR and CRP -2D echocardiogram -Hemoglobin A1c, fasting lipid panel -Neurology recommending continuing home Plavix 75 mg daily. -Frequent neurochecks -PT, OT, speech therapy. -N.p.o. until cleared by bedside swallow evaluation or formal speech evaluation  Well-controlled HIV: Labs done 12/27/2020 showing CD4 count 622 and undetectable viral load. -Resume home med after pharmacy med rec is done.  Outpatient infectious disease follow-up.  Chronic hep C -Outpatient infectious disease follow-up.  Hypertensive urgency: She was given her home antihypertensives in the ED but continues to be hypertensive with systolic around 672. -Allow permissive hypertension at this time until brain MRI is done.  Treat only if BP >220/120.  If MRI negative for stroke, then blood pressure can be reduced.  Discussed with neurology.  Insulin-dependent diabetes/neuropathy -Check A1c.  Sliding scale insulin moderate ACHS.  Patient takes Lantus 13 units at bedtime and gabapentin 900 mg at bedtime, continue.  CAD: Not endorsing any anginal symptoms.  DVT prophylaxis: Lovenox Code Status: Full code Family Communication: No family available at this time. Disposition Plan: Status is: Observation  The patient remains OBS appropriate and will d/c before 2 midnights.  Dispo: The patient is from: Home              Anticipated d/c is to: Home              Patient currently is not medically stable to d/c.   Difficult to place patient No   Level of care:  Level of care: Telemetry Medical   The medical decision making on this patient was of high complexity and the patient is at high risk for clinical deterioration, therefore this is a level 3 visit.  Shela Leff MD [facility]  If 7PM-7AM, please contact night-coverage [email or web address]  01/01/2021, 10:53 PM

## 2021-01-01 NOTE — ED Triage Notes (Signed)
Patient complains of blurred vision to left eye since 0500 when she awoke to go to bathroom. Patient alert and oriented, no neuro deficits. Patient denies pain. Alert and oiented

## 2021-01-01 NOTE — ED Notes (Signed)
Informed Dr. Donnald Garre pt's BP continues to trend high. Current BP 222/71. No new orders

## 2021-01-01 NOTE — ED Notes (Signed)
Patient transported to CT 

## 2021-01-01 NOTE — Consult Note (Addendum)
NEUROLOGY CONSULTATION NOTE   Date of service: January 01, 2021 Patient Name: Lindsey Leon MRN:  [id number] DOB:  31-Mar-1956 Reason for consult: "vision problems" _ _ _   _ __   _ __ _ _  __ __   _ __   __ _  History of Present Illness  Lindsey Leon is a 65 y.o. female with PMH significant for HTN, diabetes, chronic hep C, HIV positive, pancreatitis, prior left temporal hemorrhagic stroke and known multifocal multivessel stenosis with known chronic Right common carotid artery occlusion who presents with double vision. Reports that vision is funny and she noticed this when she woke up in the morning today. Went to bed at midnight on 01/01/21. This got worse during the course of the day so she eventually came to the ED.  Endorses trouble with her regular day due to funny vision. No arms or leg weakness, no numbness, no facial droop, no word finding difficulty. Reports that she has had some trouble with language from her prior stroke and does seem to have to pause and think to have a conversation. Endorses trouble with understanding and with expressing words and she does not think that this is any worse from what she typically has. [place]be vision goes away when she closes either eyes. Feels having some trouble with judging the distance to objects that also improves when she closes either eye.   ROS   Constitutional Denies weight loss, fever and chills.  HEENT + changes in vision but not hearing.  Respiratory Denies SOB and cough.  CV Denies palpitations and CP  GI Denies abdominal pain, nausea, vomiting and diarrhea.  GU Denies dysuria and urinary frequency.  MSK Denies myalgia and joint pain.  Skin Denies rash and pruritus.  Neurological Denies headache and syncope.  Psychiatric Denies recent changes in mood. Denies anxiety and depression.   Past History   Past Medical History:  Diagnosis Date  . Chronic hepatitis C ([place])    dx'ed in 12/2018  . Diverticulitis   . HIV infection ([place])     dx'ed in 12/2018  . Hypertension    not on medications  . Pancreatitis   . Stroke [facility])    Past Surgical History:  Procedure Laterality Date  . FLEXIBLE SIGMOIDOSCOPY N/A 02/26/2013   Procedure: FLEXIBLE SIGMOIDOSCOPY;  Surgeon: Beryle Beams, MD;  Location: [place];  Service: Endoscopy;  Laterality: N/A;   Family History  Problem Relation Age of Onset  . Healthy Father   . Cancer Mother        passed away. unknown age  . Hypertension Sister   . Diabetes Mellitus II Sister    Social History   Socioeconomic History  . Marital status: Single    Spouse name: Not on file  . Number of children: 1  . Years of education: 8  . Highest education level: Not on file  Occupational History  . Not on file  Tobacco Use  . Smoking status: Former Smoker    Packs/day: 0.50    Years: 30.00    Pack years: 15.00    Types: Cigarettes    Quit date: 05/22/2015    Years since quitting: 5.6  . Smokeless tobacco: Never Used  Vaping Use  . Vaping Use: Never used  Substance and Sexual Activity  . Alcohol use: No    Alcohol/week: 0.0 standard drinks  . Drug use: Not Currently    Types: Marijuana  . Sexual activity: Never  Other Topics Concern  .  Not on file  Social History Narrative   Patient is single with one child.    Patient is right handed.   Patient has high school education.   Social Determinants of Health   Financial Resource Strain: Not on file  Food Insecurity: Not on file  Transportation Needs: Not on file  Physical Activity: Not on file  Stress: Not on file  Social Connections: Not on file   No Known Allergies  Medications  (Not in a hospital admission)    Vitals   Vitals:   01/01/21 1900 01/01/21 1915 01/01/21 2017 01/01/21 2100  BP:   (!) 222/71 (!) 214/72  Pulse: 76 67 77 74  Resp: $Remo'12 14 13 15  'BAiKu$ Temp:      SpO2:  99% 95% 98%     There is no height or weight on file to calculate BMI.  Physical Exam   General: Laying comfortably in bed; in no  acute distress. HENT: Normal oropharynx and mucosa. Normal external appearance of ears and nose.  Neck: Supple, no pain or tenderness  CV: No JVD. No peripheral edema.  Pulmonary: Symmetric Chest rise. Normal respiratory effort.  Abdomen: Soft to touch, non-tender.  Ext: No cyanosis, edema, or deformity  Skin: No rash. Normal palpation of skin.   Musculoskeletal: Normal digits and nails by inspection. No clubbing.   Neurologic Examination  Mental status/Cognition: Alert, oriented to self, place, month and year, good attention. Speech/language: Non fluent, comprehension intact with slowe thought process and gets stuck on some words with some word finding difficulty, object naming intact, repetition intact. 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   CN V normal sensation in V1, V2, and V3 segments bilaterally   CN VII no asymmetry, no nasolabial fold flattening   CN VIII normal hearing to speech   CN IX & X normal palatal elevation, no uvular deviation   CN XI 5/5 head turn and 5/5 shoulder shrug bilaterally   CN XII midline tongue protrusion   Motor:  Muscle bulk: normal, tone normal, pronator drift none tremor none Mvmt Root Nerve  Muscle Right Left Comments  SA C5/6 Ax Deltoid 5 5   EF C5/6 Mc Biceps 5 5   EE C6/7/8 Rad Triceps 5 5   WF C6/7 Med FCR     WE C7/8 PIN ECU     F Ab C8/T1 U ADM/FDI 5 5   HF L1/2/3 Fem Illopsoas 5 5   KE L2/3/4 Fem Quad 5 5   DF L4/5 D Peron Tib Ant 5 5   PF S1/2 Tibial Grc/Sol 5 5    Reflexes:  Right Left Comments  Pectoralis      Biceps (C5/6) 2 2   Brachioradialis (C5/6) 2 2    Triceps (C6/7) 2 2    Patellar (L3/4) 2 2    Achilles (S1)      Hoffman      Plantar     Jaw jerk    Sensation:  Light touch Intact throughout   Pin prick    Temperature    Vibration   Proprioception    Coordination/Complex Motor:  - Finger to  Nose with ataxia BL, left worse than right. Improves when she closes either eyes. - Heel to shin with mild ataxia BL, again improves when she closes both eyes. - Rapid alternating movement are  normal - Gait: Deferred.  Labs   CBC:  Recent Labs  Lab 01/01/21 1307  WBC 6.2  NEUTROABS 3.3  HGB 12.8  HCT 37.2  MCV 93.5  PLT 657    Basic Metabolic Panel:  Lab Results  Component Value Date   NA 140 01/01/2021   K 4.1 01/01/2021   CO2 27 01/01/2021   GLUCOSE 102 (H) 01/01/2021   BUN 11 01/01/2021   CREATININE 0.87 01/01/2021   CALCIUM 9.9 01/01/2021   GFRNONAA >60 01/01/2021   GFRAA 78 12/01/2019   Lipid Panel:  Lab Results  Component Value Date   LDLCALC 81 04/01/2019   HgbA1c:  Lab Results  Component Value Date   HGBA1C 5.8 (H) 12/07/2018   Urine Drug Screen:     Component Value Date/Time   LABOPIA NONE DETECTED 12/24/2018 1155   COCAINSCRNUR NONE DETECTED 12/24/2018 1155   LABBENZ NONE DETECTED 12/24/2018 1155   AMPHETMU NONE DETECTED 12/24/2018 1155   THCU NONE DETECTED 12/24/2018 1155   LABBARB NONE DETECTED 12/24/2018 1155    Alcohol Level     Component Value Date/Time   ETH <10 09/16/2020 1508    CT Head without contrast: CTH was negative for a large hypodensity concerning for a large territory infarct or hyperdensity concerning for an ICH.  Redemonstrated chronic lacunar infarcts within the deep gray nuclei bilaterally. Redemonstrated severe cerebral white matter chronic small vessel ischemic disease. Chronic small vessel ischemic changes also again seen within the a pons, including a chronic right pontine lacunar infarct.   CT angio Head and Neck with contrast: Pending.  MRI Brain with and without contrast: pending   Impression   Lindsey Leon is a 65 y.o. female with PMH significant for HTN, diabetes, chronic hep C, HIV positive(compliant with HAART), pancreatitis, prior left temporal hemorrhagic stroke and known multifocal multivessel  stenosis with known chronic Right common carotid artery occlusion who presents with double vision. 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 The pastpointing improves with closing either eyes, which makes me think that this is probably due to double vision. However, given her stroke risk factors and known multifocal multivessel stenosis and R CCA occlusion, recommend obtaining CT angio and MRI Aaron Edelman with and without. 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 Low suspicion for infectious, inflammatory or neoplastic disorders affecting the meninges due to lack of involvement of other cranial nerves but will get contrast MRI to evaluate for any abnormal enhancement thou along with ESR and CRP.  Recommendations  - MRI brain with and without contrast to assess for cerebellar stroke or any enhancement of left Cranial Nerve 3. CT angio head and neck to rule out aneurysm. - ESR, CRP. - Continue home plavix $RemoveBef'75mg'gVhOcJfBiq$  daily. ______________________________________________________________________   Thank you for the opportunity to take part in the care of this patient. If you have any further questions, please contact the neurology consultation attending.  Signed,  [place] Pager Number [phone] _ _ _   _ __   _ __ _ _  __ __   _ __   __ _

## 2021-01-01 NOTE — ED Notes (Signed)
Reports normal vision at present.

## 2021-01-01 NOTE — ED Notes (Signed)
Pt placed on high fall risk d/t shuffled gait.

## 2021-01-01 NOTE — ED Provider Notes (Addendum)
[facility] Provider Note   CSN: [id number] Arrival date & time: 01/01/21  1229     History No chief complaint on file.   Lindsey Leon is a 65 y.o. female.  HPI Patient reports that she went to bed normal last night (3/18) at midnight.  [facility].  She reports that she had dinner per usual and a snack in the evening.  She did not have headache or feel unwell.  She reports that she woke up at 5 AM to use the bathroom and noticed there was a problem with her vision.  She cannot describe exactly what was wrong with her vision.  She reports that she could see enough to go to the bathroom and go back to bed.  She is not sure if there were parts of the vision missing or it was blurry or double.  She did not note that she was incoordinated in terms of being significantly off balance, dizzy or having focal weakness numbness or tingling of her extremities.  She reports that the symptoms persisted today and then she finally realized that she was having problems and determined to come to the emergency department from SNF by EMS.  Patient does have history of other small strokes.  She reports however she does not have significant problems with right or left extremities.  She reports she does sometimes have difficulty with word finding, she is not sure if that is any worse than normal for her right now.  Patient does have a notably disconjugate gaze.  She denies that that is baseline for her.  She reports her HIV is well controlled.  She has not been having any acute problems.  She has been feeling well recently.  She reports she has completed treatment for hepatitis C and has no residual problems.    Past Medical History:  Diagnosis Date  . Chronic hepatitis C ([facility])    dx'ed in 12/2018  . Diverticulitis   . HIV infection ([facility])    dx'ed in 12/2018  . Hypertension    not on medications  . Pancreatitis   . Stroke [facility])     Patient  Active Problem List   Diagnosis Date Noted  . Routine screening for STI (sexually transmitted infection) 12/01/2019  . Type 2 diabetes mellitus with hyperglycemia ([facility]) 02/2019  . Dementia ([facility]) 12/27/2018  . Human immunodeficiency virus (HIV) disease ([facility]) 12/25/2018  . Carotid artery occlusion 12/25/2018  . CVA (cerebral vascular accident) ([facility]) 12/25/2018  . Chronic hepatitis C without hepatic coma ([facility]) 12/2018  . TIA (transient ischemic attack) 12/06/2018  . ICH (intracerebral hemorrhage) ([facility]) 05/23/2014  . Diverticulitis of colon 02/24/2013  . Pancreatitis, Hx of 02/24/2013  . Alcohol abuse 02/24/2013    Past Surgical History:  Procedure Laterality Date  . FLEXIBLE SIGMOIDOSCOPY N/A 02/26/2013   Procedure: FLEXIBLE SIGMOIDOSCOPY;  Surgeon: Theda BelfastPatrick D Hung, MD;  [facility];  Service: Endoscopy;  Laterality: N/A;     OB History   No obstetric history on file.     Family History  Problem Relation Age of Onset  . Healthy Father   . Cancer Mother        passed away. unknown age  . Hypertension Sister   . Diabetes Mellitus II Sister     Social History   Tobacco Use  . Smoking status: Former Smoker    Packs/day: 0.50    Years: 30.00    Pack years:  15.00    Types: Cigarettes    Quit date: 05/22/2015    Years since quitting: 5.6  . Smokeless tobacco: Never Used  Vaping Use  . Vaping Use: Never used  Substance Use Topics  . Alcohol use: No    Alcohol/week: 0.0 standard drinks  . Drug use: Not Currently    Types: Marijuana    Home Medications Prior to Admission medications   Medication Sig Start Date End Date Taking? Authorizing Provider  acetaminophen (TYLENOL) 650 MG CR tablet Take 650 mg by mouth every 8 (eight) hours as needed for pain.    [provider]  atorvastatin (LIPITOR) 80 MG tablet Take 80 mg by mouth daily.    [provider]  BIKTARVY 50-200-25 MG TABS tablet TAKE 1 TABLET BY MOUTH DAILY. 07/30/20   Comer, Belia Heman, MD   calcium carbonate (TUMS - DOSED IN MG ELEMENTAL CALCIUM) 500 MG chewable tablet Chew 1 tablet by mouth 2 (two) times daily as needed for indigestion or heartburn.    [provider]  Cholecalciferol (VITAMIN D) 50 MCG (2000 UT) tablet Take 2,000 Units by mouth daily.    [provider]  clopidogrel (PLAVIX) 75 MG tablet Take 1 tablet (75 mg total) by mouth daily. 01/01/19   Masoudi, Shawna Orleans, MD  ferrous gluconate (FERGON) 324 MG tablet Take 324 mg by mouth daily with breakfast.    [provider]  gabapentin (NEURONTIN) 100 MG capsule Take 100 mg by mouth at bedtime.    [provider]  gabapentin (NEURONTIN) 800 MG tablet Take 800 mg by mouth at bedtime.    [provider]  insulin glargine (LANTUS) 100 UNIT/ML injection Inject 13 Units into the skin at bedtime.    [provider]  lidocaine (LIDODERM) 5 % Place 1 patch onto the skin daily. Apply to right foot    [provider]  losartan (COZAAR) 50 MG tablet Take 50 mg by mouth daily.    [provider]  metoprolol tartrate (LOPRESSOR) 50 MG tablet Take 50 mg by mouth daily.    [provider]  PRESCRIPTION MEDICATION 1 application daily as needed. Biofreeze 5% Apply to right foot daily for pain    [provider]  sennosides-docusate sodium (SENOKOT-S) 8.6-50 MG tablet Take 2 tablets by mouth at bedtime.    [provider]  thiamine (VITAMIN B-1) 100 MG tablet Take 100 mg by mouth every evening.    [provider]    Allergies    Patient has no known allergies.  Review of Systems   Review of Systems 10 systems reviewed and negative except as per HPI Physical Exam Updated Vital Signs BP (!) 214/72   Pulse 74   Temp 98.1 F (36.7 C)   Resp 15   SpO2 98%   Physical Exam Constitutional:      Comments: Patient is alert.  No acute distress.  She is interactive and pleasant.  No respiratory distress  HENT:     Head:  Normocephalic and atraumatic.     Nose: Nose normal.     Mouth/Throat:     Mouth: Mucous membranes are moist.     Pharynx: Oropharynx is clear.  Eyes:     Comments: Patient has external deviation of the left eye.  Pupils are symmetric about 3 mm and responsive.  Patient can track with her eyes but the left eye does not go past midline and stays relatively delayed relative to the right.  Both eyes  exhibit slight rotary nystagmus.  Visual field testing is challenging.  Findings were inconsistent.  Patient did endorse being able to see my digits with both eyes independently.  Cardiovascular:     Rate and Rhythm: Normal rate and regular rhythm.  Pulmonary:     Effort: Pulmonary effort is normal.     Breath sounds: Normal breath sounds.  Abdominal:     General: There is no distension.     Palpations: Abdomen is soft.     Tenderness: There is no abdominal tenderness.  Musculoskeletal:        General: No swelling or tenderness. Normal range of motion.     Right lower leg: No edema.     Left lower leg: No edema.  Skin:    General: Skin is warm and dry.  Neurological:     Comments: Patient is alert.  She is situationally appropriate.  She did at times exhibit some delay in word finding.  She was able to correct however and communication was clear.  Grip strength and push pull symmetric upper extremities.  Patient hold each lower extremity off the bed for 10 seconds and resist downward pressure.  She did have significant difficulty with finger-nose exam on the left.  She advised that she could not see the finger in the same place consistently, suggesting field cut.  Psychiatric:        Mood and Affect: Mood normal.     ED Results / Procedures / Treatments   Labs (all labs ordered are listed, but only abnormal results are displayed) Labs Reviewed  COMPREHENSIVE METABOLIC PANEL - Abnormal; Notable for the following components:      Result Value   Glucose, Bld 102 (*)    All other components  within normal limits  PROTIME-INR  APTT  CBC  DIFFERENTIAL  CBG MONITORING, ED    EKG EKG Interpretation  Date/Time:  Saturday January 01 2021 16:54:34 EDT Ventricular Rate:  68 PR Interval:    QRS Duration: 98 QT Interval:  449 QTC Calculation: 478 R Axis:   -44 Text Interpretation: Sinus rhythm Probable left atrial enlargement Left anterior fascicular block Abnormal R-wave progression, late transition LVH with secondary repolarization abnormality agree, no STEMI, Confirmed by Arby Barrette [phone]) on 01/01/2021 9:39:36 PM   Radiology CT HEAD WO CONTRAST  Result Date: 01/01/2021 CLINICAL DATA:  Neuro deficit, acute, stroke suspected. Additional provided: Patient reports blurred vision in left eye since 5 a.m. EXAM: CT HEAD WITHOUT CONTRAST TECHNIQUE: Contiguous axial images were obtained from the base of the skull through the vertex without intravenous contrast. COMPARISON:  Brain MRI 09/16/2020. FINDINGS: Brain: Mild cerebral and cerebellar atrophy. Commensurate prominence of the ventricles and sulci. Redemonstrated chronic lacunar infarcts within the deep gray nuclei bilaterally. Redemonstrated chronic small vessel ischemic disease within the pons, including a chronic right pontine lacunar infarct. Advanced patchy and ill-defined hypoattenuation within the cerebral white matter is nonspecific, but compatible with chronic small vessel ischemic disease. There is no acute intracranial hemorrhage. No demarcated cortical infarct. No extra-axial fluid collection. No evidence of intracranial mass. No midline shift. Vascular: No hyperdense vessel.  Atherosclerotic calcifications Skull: Normal. Negative for fracture or focal lesion. Sinuses/Orbits: Visualized orbits show no acute finding. No significant paranasal sinus disease at the imaged levels. IMPRESSION: No evidence of acute intracranial abnormality. Redemonstrated chronic lacunar infarcts within the deep gray nuclei bilaterally.  Redemonstrated severe cerebral white matter chronic small vessel ischemic disease. Chronic small vessel ischemic changes also again seen within the a  pons, including a chronic right pontine lacunar infarct. Mild generalized parenchymal atrophy. Electronically Signed   By: Jackey Loge DO   On: 01/01/2021 14:54    Procedures Procedures   Medications Ordered in ED Medications  sodium chloride flush (NS) 0.9 % injection 3 mL (3 mLs Intravenous Not Given 01/01/21 1917)  insulin glargine (LANTUS) injection 13 Units (has no administration in time range)  losartan (COZAAR) tablet 50 mg (has no administration in time range)  metoprolol tartrate (LOPRESSOR) tablet 50 mg (has no administration in time range)  gabapentin (NEURONTIN) capsule 900 mg (has no administration in time range)  LORazepam (ATIVAN) injection 0.5 mg (has no administration in time range)  clopidogrel (PLAVIX) tablet 75 mg (has no administration in time range)  0.9 %  sodium chloride infusion ( Intravenous New Bag/Given 01/01/21 2017)  iohexol (OMNIPAQUE) 350 MG/ML injection 75 mL (75 mLs Intravenous Contrast Given 01/01/21 1956)    ED Course  I have reviewed the triage vital signs and the nursing notes.  Pertinent labs & imaging results that were available during my care of the patient were reviewed by me and considered in my medical decision making (see chart for details).    MDM Rules/Calculators/A&P                         Consult: Reviewed with Dr.Kahliqdina.  This time does not sound consistent with LVO presentation.  She does have history of prior lacunar infarcts and may have recurrence.  Recommends proceeding with CT angiograms, if no acute findings, can proceed with Plavix and MRI, with medical admission.  Patient presents with visual change.  Exam she has objective disconjugate gaze.  Otherwise, she does not have focal neurologic deficit.  Patient does have prior history of stroke.  She is alert, no headache, no fever or  signs of infectious etiology.  Patient has well-controlled HIV.  EMR are reviewed for any evidence of pre-existing disconjugate gaze.  Limited documentation in most recent charts.  I cannot identify any documentation of prior similar finding.  We will proceed with stroke work-up. Final Clinical Impression(s) / ED Diagnoses Final diagnoses:  Visual disturbance  Cerebral vascular disease  Primary hypertension    Rx / DC Orders ED Discharge Orders    None       Arby Barrette, MD 01/01/21 2151    Arby Barrette, MD 01/01/21 2208

## 2021-01-02 ENCOUNTER — Observation Stay (HOSPITAL_COMMUNITY): Payer: Medicaid Other

## 2021-01-02 DIAGNOSIS — I679 Cerebrovascular disease, unspecified: Secondary | ICD-10-CM | POA: Diagnosis not present

## 2021-01-02 DIAGNOSIS — Z8673 Personal history of transient ischemic attack (TIA), and cerebral infarction without residual deficits: Secondary | ICD-10-CM | POA: Diagnosis not present

## 2021-01-02 DIAGNOSIS — I6302 Cerebral infarction due to thrombosis of basilar artery: Secondary | ICD-10-CM

## 2021-01-02 DIAGNOSIS — R29700 NIHSS score 0: Secondary | ICD-10-CM | POA: Diagnosis present

## 2021-01-02 DIAGNOSIS — H4902 Third [oculomotor] nerve palsy, left eye: Secondary | ICD-10-CM

## 2021-01-02 DIAGNOSIS — Z794 Long term (current) use of insulin: Secondary | ICD-10-CM | POA: Diagnosis not present

## 2021-01-02 DIAGNOSIS — B182 Chronic viral hepatitis C: Secondary | ICD-10-CM | POA: Diagnosis present

## 2021-01-02 DIAGNOSIS — Z79899 Other long term (current) drug therapy: Secondary | ICD-10-CM | POA: Diagnosis not present

## 2021-01-02 DIAGNOSIS — I421 Obstructive hypertrophic cardiomyopathy: Secondary | ICD-10-CM | POA: Diagnosis present

## 2021-01-02 DIAGNOSIS — H547 Unspecified visual loss: Secondary | ICD-10-CM | POA: Diagnosis present

## 2021-01-02 DIAGNOSIS — I6329 Cerebral infarction due to unspecified occlusion or stenosis of other precerebral arteries: Secondary | ICD-10-CM | POA: Diagnosis present

## 2021-01-02 DIAGNOSIS — I6389 Other cerebral infarction: Secondary | ICD-10-CM | POA: Diagnosis not present

## 2021-01-02 DIAGNOSIS — Z20822 Contact with and (suspected) exposure to covid-19: Secondary | ICD-10-CM | POA: Diagnosis present

## 2021-01-02 DIAGNOSIS — Z8249 Family history of ischemic heart disease and other diseases of the circulatory system: Secondary | ICD-10-CM | POA: Diagnosis not present

## 2021-01-02 DIAGNOSIS — Z87891 Personal history of nicotine dependence: Secondary | ICD-10-CM | POA: Diagnosis not present

## 2021-01-02 DIAGNOSIS — I160 Hypertensive urgency: Secondary | ICD-10-CM | POA: Diagnosis present

## 2021-01-02 DIAGNOSIS — H532 Diplopia: Secondary | ICD-10-CM | POA: Diagnosis not present

## 2021-01-02 DIAGNOSIS — Z7902 Long term (current) use of antithrombotics/antiplatelets: Secondary | ICD-10-CM | POA: Diagnosis not present

## 2021-01-02 DIAGNOSIS — Z833 Family history of diabetes mellitus: Secondary | ICD-10-CM | POA: Diagnosis not present

## 2021-01-02 DIAGNOSIS — I2510 Atherosclerotic heart disease of native coronary artery without angina pectoris: Secondary | ICD-10-CM | POA: Diagnosis present

## 2021-01-02 DIAGNOSIS — I6521 Occlusion and stenosis of right carotid artery: Secondary | ICD-10-CM | POA: Diagnosis present

## 2021-01-02 DIAGNOSIS — B20 Human immunodeficiency virus [HIV] disease: Secondary | ICD-10-CM

## 2021-01-02 DIAGNOSIS — E1140 Type 2 diabetes mellitus with diabetic neuropathy, unspecified: Secondary | ICD-10-CM | POA: Diagnosis present

## 2021-01-02 DIAGNOSIS — I10 Essential (primary) hypertension: Secondary | ICD-10-CM | POA: Diagnosis present

## 2021-01-02 DIAGNOSIS — Z951 Presence of aortocoronary bypass graft: Secondary | ICD-10-CM | POA: Diagnosis not present

## 2021-01-02 LAB — LIPID PANEL
Cholesterol: 94 mg/dL (ref 0–200)
HDL: 36 mg/dL — ABNORMAL LOW (ref >40)
LDL Cholesterol: 20 mg/dL (ref 0–99)
Total CHOL/HDL Ratio: 2.6 RATIO
Triglycerides: 191 mg/dL — ABNORMAL HIGH (ref <150)
VLDL: 38 mg/dL (ref 0–40)

## 2021-01-02 LAB — ECHOCARDIOGRAM COMPLETE
AR max vel: 1.41 cm2
AV Area VTI: 1.75 cm2
AV Area mean vel: 1.24 cm2
AV Mean grad: 9.5 mmHg
AV Peak grad: 20.3 mmHg
Ao pk vel: 2.25 m/s
Area-P 1/2: 2.29 cm2
P 1/2 time: 552 msec
S' Lateral: 3.1 cm

## 2021-01-02 LAB — SARS CORONAVIRUS 2 (TAT 6-24 HRS): SARS Coronavirus 2: NEGATIVE

## 2021-01-02 LAB — GLUCOSE, CAPILLARY
Glucose-Capillary: 91 mg/dL (ref 70–99)
Glucose-Capillary: 100 mg/dL — ABNORMAL HIGH (ref 70–99)
Glucose-Capillary: 113 mg/dL — ABNORMAL HIGH (ref 70–99)
Glucose-Capillary: 123 mg/dL — ABNORMAL HIGH (ref 70–99)

## 2021-01-02 LAB — HEMOGLOBIN A1C
Hgb A1c MFr Bld: 6.9 % — ABNORMAL HIGH (ref 4.8–5.6)
Mean Plasma Glucose: 151.33 mg/dL

## 2021-01-02 LAB — C-REACTIVE PROTEIN: CRP: 0.7 mg/dL (ref <1.0)

## 2021-01-02 LAB — SEDIMENTATION RATE: Sed Rate: 10 mm/hr (ref 0–22)

## 2021-01-02 MED ORDER — ASPIRIN EC 325 MG PO TBEC
325.0000 mg | DELAYED_RELEASE_TABLET | Freq: Every day | ORAL | Status: DC
  Administered 2021-01-02: 325 mg via ORAL
  Filled 2021-01-02: qty 1

## 2021-01-02 MED ORDER — TICAGRELOR 90 MG PO TABS
90.0000 mg | ORAL_TABLET | Freq: Two times a day (BID) | ORAL | Status: DC
  Administered 2021-01-02 – 2021-01-03 (×2): 90 mg via ORAL
  Filled 2021-01-02 (×2): qty 1

## 2021-01-02 MED ORDER — ASPIRIN EC 81 MG PO TBEC
81.0000 mg | DELAYED_RELEASE_TABLET | Freq: Every day | ORAL | Status: DC
  Administered 2021-01-03: 81 mg via ORAL
  Filled 2021-01-02: qty 1

## 2021-01-02 MED ORDER — BICTEGRAVIR-EMTRICITAB-TENOFOV 50-200-25 MG PO TABS
1.0000 | ORAL_TABLET | Freq: Every day | ORAL | Status: DC
  Administered 2021-01-02 – 2021-01-03 (×2): 1 via ORAL
  Filled 2021-01-02 (×2): qty 1

## 2021-01-02 MED ORDER — GADOBUTROL 1 MMOL/ML IV SOLN
7.5000 mL | Freq: Once | INTRAVENOUS | Status: AC | PRN
  Administered 2021-01-02: 7.5 mL via INTRAVENOUS

## 2021-01-02 MED ORDER — ASPIRIN 81 MG PO CHEW: 81.0000 mg | CHEWABLE_TABLET | Freq: Every day | ORAL | Status: DC

## 2021-01-02 NOTE — Evaluation (Signed)
Speech Language Pathology Evaluation Patient Details Name: Lindsey Leon MRN: [id number] DOB: 1956/09/07 Today's Date: 01/02/2021 Time: 1779-3903 SLP Time Calculation (min) (ACUTE ONLY): 25 min  Problem List:  Patient Active Problem List   Diagnosis Date Noted  . Diplopia 01/01/2021  . 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 HTN (hypertension) 01/01/2021  . Routine screening for STI (sexually transmitted infection) 12/01/2019  . Type 2 diabetes mellitus with hyperglycemia (HCC) 02/2019  . Dementia (HCC) 12/27/2018  . Human immunodeficiency virus (HIV) disease (HCC) 12/25/2018  . Carotid artery occlusion 12/25/2018  . CVA (cerebral vascular accident) (HCC) 12/25/2018  . Chronic hepatitis C without hepatic coma (HCC) 12/2018  . TIA (transient ischemic attack) 12/06/2018  . ICH (intracerebral hemorrhage) (HCC) 05/23/2014  . Diverticulitis of colon 02/24/2013  . Pancreatitis, Hx of 02/24/2013  . Alcohol abuse 02/24/2013   Past Medical History:  Past Medical History:  Diagnosis Date  . Chronic hepatitis C (HCC)    dx'ed in 12/2018  . Diverticulitis   . HIV infection (HCC)    dx'ed in 12/2018  . Hypertension    not on medications  . Pancreatitis   . Stroke [facility])    Past Surgical History:  Past Surgical History:  Procedure Laterality Date  . FLEXIBLE SIGMOIDOSCOPY N/A 02/26/2013   Procedure: FLEXIBLE SIGMOIDOSCOPY;  Surgeon: Theda Belfast, MD;  Location: [facility] ENDOSCOPY;  Service: Endoscopy;  Laterality: N/A;   HPI:  Pt is a 65 y.o. female who presented 3/19 with acute onset double vision in her L eye. 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 Head CT negative for acute findings. Stable CTA of the head and neck as compared to 09/16/2020. MRI of head revealed 6 mm acute to subacute ischemic infarct involving the L basal ganglia/corona radiata with probable additional punctate acute to subacute ischemic  infarct involving the left dorsal pons and multiple remote lacunar infarcts involving the bilateral basal ganglia, thalami, and pons. PMH: CAD status post CABG, HOCM, well-controlled HIV, chronic hep C, hypertension, pancreatitis, insulin-dependent diabetes, stroke.   Assessment / Plan / Recommendation Clinical Impression  Patient presents with a mild-moderate cognitive deficit impacting her delayed recall, working memory, awareness to deficit impact and decreased organization of thoughts and decreased problem solving. Patient did have some awareness, telling SLP, "It would take me a while, but not this long" during mental calculation task. She received a score of 17 out of 30 on [facility] Mental Status exam which places her in category of "Dementia". Patient to be returning to [facility] where she is already a resident and SLP is recommending that she be evaluated and treated as needed due to cognitive impairment.    SLP Assessment  SLP Recommendation/Assessment: All further Speech Lanaguage Pathology  needs can be addressed in the next venue of care SLP Visit Diagnosis: Cognitive communication deficit (R41.841)    Follow Up Recommendations  Skilled Nursing facility;24 hour supervision/assistance    Frequency and Duration   N/A        SLP Evaluation Cognition  Overall Cognitive Status: Difficult to assess Arousal/Alertness: Awake/alert Orientation Level: Oriented to person;Oriented to place;Oriented to time Attention: Sustained Sustained Attention: Appears intact Memory: Impaired Memory Impairment: Storage deficit Memory Recall Sock: Not able to recall Memory Recall Blue: Not able to recall Memory Recall Bed: Not able to recall Awareness: Impaired Awareness Impairment: Emergent impairment Problem Solving: Impaired Problem Solving Impairment: Verbal complex Executive Function: Landscape architect:  Impaired Organizing Impairment: Verbal complex Safety/Judgment:  Appears intact       Comprehension  Auditory Comprehension Overall Auditory Comprehension: Appears within functional limits for tasks assessed    Expression Expression Primary Mode of Expression: Verbal Verbal Expression Overall Verbal Expression: Appears within functional limits for tasks assessed Initiation: No impairment Repetition: No impairment Naming: No impairment   Oral / Motor  Oral Motor/Sensory Function Overall Oral Motor/Sensory Function: Within functional limits Motor Speech Overall Motor Speech: Appears within functional limits for tasks assessed Respiration: Within functional limits Resonance: Within functional limits Articulation: Within functional limitis Intelligibility: Intelligible Motor Planning: Witnin functional limits   GO                    Angela Nevin, MA, CCC-SLP Speech Therapy [facility]

## 2021-01-02 NOTE — Progress Notes (Signed)
MEWS should be green for permissive hypertension

## 2021-01-02 NOTE — ED Notes (Signed)
Per Dr Loney Loh pressures parameters are 220/120.

## 2021-01-02 NOTE — Progress Notes (Signed)
PROGRESS NOTE    Lindsey Leon  ZOX:[id number] DOB: [birth year] DOA: 01/01/2021 PCP: Default, Provider, MD   Brief Narrative:  Lindsey Leon is a 65 y.o. female with medical history significant of CAD status Leon CABG, HOCM, well-controlled HIV, chronic hep C, hypertension, pancreatitis, insulin-dependent diabetes, stroke presented to the ED for evaluation of acute onset double vision.  Patient states she woke up around 4 or 5 AM this morning to use the bathroom and noticed that she was having double vision in her left eye which then persisted throughout the day and has now resolved.  She did not have any weakness in her arm or leg did not have numbness anywhere.  Did not have difficulty speaking.  States she takes all of her home medications but her blood pressure is always high.  Patient has no other complaints.  Denies cough, shortness of breath, or chest pain.  She is fully vaccinated against COVID.  ED Course: Neurology consulted.  Hypertensive with systolic up to 220s.  Labs showing WBC 6.2, hemoglobin 12.8, platelet count 240K.  Sodium 140, potassium 4.1, chloride 105, bicarb 27, BUN 11, creatinine 0.8, glucose 102.  INR 1.1.  Head CT negative for acute finding. Patient was given Plavix 75 mg and her home antihypertensives including losartan and metoprolol.   Assessment & Plan:   Principal Problem:   Diplopia Active Problems:   Human immunodeficiency virus (HIV) disease (HCC)   CVA (cerebral vascular accident) (HCC)   Chronic hepatitis C without hepatic coma (HCC)   Cranial nerve III palsy, left   HTN (hypertension)   Acute CVA with diplopia, left cranial nerve III palsy:  -Telemetry monitoring -MRI of the brain with and without contrast shows acute CVA -Discussed with stroke team, recommended discontinuation of Plavix and starting Brilinta with aspirin for 30 days and then Plavix and aspirin after that -  2D echocardiogram did not identify PFO, normal EF 60-65 grade 1  diastolic dysfunction -Frequent neurochecks -PT recommending SNF-patient currently resident at [facility] Hopefully should be able to return Monday morning  Well-controlled HIV: Labs done 12/27/2020 showing CD4 count 622 and undetectable viral load. -ResumeD home medications  Outpatient infectious disease follow-up.  Chronic hep C -Outpatient infectious disease follow-up.  Hypertensive urgency: She was given her home antihypertensives in the ED but continues to be hypertensive with systolic around 200. -, Allow permissive hypertension at this time until brain MRI is done.  Treat only if BP >220/120.    Titrate medications back in slowly  Insulin-dependent diabetes/neuropathy -Check A1c.  Sliding scale insulin moderate ACHS.  Patient takes Lantus 13 units at bedtime and gabapentin 900 mg at bedtime, continue.  CAD: Not endorsing any anginal symptoms.  DVT prophylaxis: Lovenox SQ  Code Status: FULL    Code Status Orders  (From admission, onward)         Start     Ordered   01/01/21 2259  Full code  Continuous        01/01/21 2259        Code Status History    Date Active Date Inactive Code Status Order ID Comments User Context   12/24/2018 2011 01/01/2019 1949 Full Code [id number]  Reymundo Poll, MD Inpatient   12/06/2018 1344 12/07/2018 2138 Full Code [id number]  Angelita Ingles, MD ED   05/23/2014 1610 05/26/2014 1630 Full Code [id number]  Ritta Slot, MD ED   02/24/2013 1555 02/26/2013 1707 Full Code [id number]  Dede Query, MD ED   Advance Care  Planning Activity     Family Communication: Discussed with sister Baldwin Crown Disposition Plan:    Patient converted to inpatient Will remain inpatient for treatment of acute stroke with multiple evaluations pending.  Also initiating treatment with a new blood thinner.  If patient tolerates without further complication anticipate discharge home tomorrow. Patient not yet medically stable for discharge back to her skilled  nursing facility  Consults called: None Admission status: Inpatient   Consultants:   Neurology  Procedures:  CT Angio Head W or Wo Contrast  Result Date: 01/01/2021 CLINICAL DATA:  Initial evaluation for left-sided blurry vision, stroke. EXAM: CT ANGIOGRAPHY HEAD AND NECK TECHNIQUE: Multidetector CT imaging of the head and neck was performed using the standard protocol during bolus administration of intravenous contrast. Multiplanar CT image reconstructions and MIPs were obtained to evaluate the vascular anatomy. Carotid stenosis measurements (when applicable) are obtained utilizing NASCET criteria, using the distal internal carotid diameter as the denominator. CONTRAST:  75mL OMNIPAQUE IOHEXOL 350 MG/ML SOLN COMPARISON:  Prior CT from earlier the same day. FINDINGS: CTA NECK FINDINGS Aortic arch: Visualized aortic arch normal in caliber with normal branch pattern. Moderate atheromatous change about the arch and origin of the great vessels without hemodynamically significant stenosis. Visualized subclavian arteries patent. 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 Distal reconstitution at the right carotid bifurcation via right external carotid artery branches. Heavy calcified plaque at the right bifurcation with associated severe at least 80% stenosis by NASCET criteria. Right ICA somewhat diminutive but patent distally to the skull base. Left carotid system: Scattered eccentric calcified plaque throughout the left carotid artery system without hemodynamically significant stenosis. Vertebral arteries: Both vertebral arteries arise from subclavian arteries. Atheromatous change about the origins of both vertebral arteries with moderate stenosis on the left and more severe proximal stenosis on the right (series 7, image 270). Vertebral arteries otherwise irregular but patent within the neck. Skeleton: No visible acute osseous finding. Osseous structures are somewhat  mottled without discrete osseous lesion. Scattered dental caries noted. Other neck: No other acute soft tissue abnormality within the neck. No mass or adenopathy. Upper chest: Visualized upper chest demonstrates no other acute finding. Review of the MIP images confirms the above findings CTA HEAD FINDINGS Anterior circulation: Petrous segments patent bilaterally. Atheromatous change throughout the carotid siphons with associated stenoses at the supraclinoid segments, moderate on the right, more severe on the left. A1 segments patent bilaterally. Normal anterior communicating artery complex. Anterior cerebral arteries patent to their distal aspects without stenosis. No M1 stenosis or occlusion. No proximal MCA branch occlusion. Distal MCA branches well perfused and fairly symmetric. Posterior circulation: Both V4 segments patent without significant stenosis. Both PICA origins patent and normal. Basilar widely patent. Superior cerebellar arteries patent. Both PCAs primarily supplied via the basilar. Moderate multifocal bilateral P2 stenoses. PCAs remain patent to their distal aspects. Venous sinuses: Patent. Anatomic variants: None significant. Review of the MIP images confirms the above findings IMPRESSION: 1. Stable CTA of the head and neck as compared to 09/16/2020. 2. 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 Additional heavy calcified plaque at the right bifurcation with severe at least 80% stenosis by NASCET criteria. 3. Atheromatous change throughout the carotid siphons with associated bilateral supraclinoid stenoses, moderate on the right and more severe on the left. 4. Atheromatous plaque about the origins of both vertebral arteries, with associated severe stenosis on the right and more moderate narrowing on  the left. Electronically Signed   By: Rise Mu M.D.   On: 01/01/2021 21:19   CT HEAD WO CONTRAST  Result Date: 01/01/2021 CLINICAL  DATA:  Neuro deficit, acute, stroke suspected. Additional provided: Patient reports blurred vision in left eye since 5 a.m. EXAM: CT HEAD WITHOUT CONTRAST TECHNIQUE: Contiguous axial images were obtained from the base of the skull through the vertex without intravenous contrast. COMPARISON:  Brain MRI 09/16/2020. FINDINGS: Brain: Mild cerebral and cerebellar atrophy. Commensurate prominence of the ventricles and sulci. Redemonstrated chronic lacunar infarcts within the deep gray nuclei bilaterally. Redemonstrated chronic small vessel ischemic disease within the pons, including a chronic right pontine lacunar infarct. Advanced patchy and ill-defined hypoattenuation within the cerebral white matter is nonspecific, but compatible with chronic small vessel ischemic disease. There is no acute intracranial hemorrhage. No demarcated cortical infarct. No extra-axial fluid collection. No evidence of intracranial mass. No midline shift. Vascular: No hyperdense vessel.  Atherosclerotic calcifications Skull: Normal. Negative for fracture or focal lesion. Sinuses/Orbits: Visualized orbits show no acute finding. No significant paranasal sinus disease at the imaged levels. IMPRESSION: No evidence of acute intracranial abnormality. Redemonstrated chronic lacunar infarcts within the deep gray nuclei bilaterally. Redemonstrated severe cerebral white matter chronic small vessel ischemic disease. Chronic small vessel ischemic changes also again seen within the a pons, including a chronic right pontine lacunar infarct. Mild generalized parenchymal atrophy. Electronically Signed   By: Jackey Loge DO   On: 01/01/2021 14:54   CT Angio Neck W and/or Wo Contrast  Result Date: 01/01/2021 CLINICAL DATA:  Initial evaluation for left-sided blurry vision, stroke. EXAM: CT ANGIOGRAPHY HEAD AND NECK TECHNIQUE: Multidetector CT imaging of the head and neck was performed using the standard protocol during bolus administration of intravenous  contrast. Multiplanar CT image reconstructions and MIPs were obtained to evaluate the vascular anatomy. Carotid stenosis measurements (when applicable) are obtained utilizing NASCET criteria, using the distal internal carotid diameter as the denominator. CONTRAST:  75mL OMNIPAQUE IOHEXOL 350 MG/ML SOLN COMPARISON:  Prior CT from earlier the same day. FINDINGS: CTA NECK FINDINGS Aortic arch: Visualized aortic arch normal in caliber with normal branch pattern. Moderate atheromatous change about the arch and origin of the great vessels without hemodynamically significant stenosis. Visualized subclavian arteries patent. 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 Distal reconstitution at the right carotid bifurcation via right external carotid artery branches. Heavy calcified plaque at the right bifurcation with associated severe at least 80% stenosis by NASCET criteria. Right ICA somewhat diminutive but patent distally to the skull base. Left carotid system: Scattered eccentric calcified plaque throughout the left carotid artery system without hemodynamically significant stenosis. Vertebral arteries: Both vertebral arteries arise from subclavian arteries. Atheromatous change about the origins of both vertebral arteries with moderate stenosis on the left and more severe proximal stenosis on the right (series 7, image 270). Vertebral arteries otherwise irregular but patent within the neck. Skeleton: No visible acute osseous finding. Osseous structures are somewhat mottled without discrete osseous lesion. Scattered dental caries noted. Other neck: No other acute soft tissue abnormality within the neck. No mass or adenopathy. Upper chest: Visualized upper chest demonstrates no other acute finding. Review of the MIP images confirms the above findings CTA HEAD FINDINGS Anterior circulation: Petrous segments patent bilaterally. Atheromatous change throughout the carotid siphons with  associated stenoses at the supraclinoid segments, moderate on the right, more severe on the left. A1 segments patent bilaterally. Normal anterior communicating artery complex. Anterior cerebral arteries  patent to their distal aspects without stenosis. No M1 stenosis or occlusion. No proximal MCA branch occlusion. Distal MCA branches well perfused and fairly symmetric. Posterior circulation: Both V4 segments patent without significant stenosis. Both PICA origins patent and normal. Basilar widely patent. Superior cerebellar arteries patent. Both PCAs primarily supplied via the basilar. Moderate multifocal bilateral P2 stenoses. PCAs remain patent to their distal aspects. Venous sinuses: Patent. Anatomic variants: None significant. Review of the MIP images confirms the above findings IMPRESSION: 1. Stable CTA of the head and neck as compared to 09/16/2020. 2. 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 Additional heavy calcified plaque at the right bifurcation with severe at least 80% stenosis by NASCET criteria. 3. Atheromatous change throughout the carotid siphons with associated bilateral supraclinoid stenoses, moderate on the right and more severe on the left. 4. Atheromatous plaque about the origins of both vertebral arteries, with associated severe stenosis on the right and more moderate narrowing on the left. Electronically Signed   By: Rise Mu M.D.   On: 01/01/2021 21:19   MR BRAIN W WO CONTRAST  Result Date: 01/02/2021 CLINICAL DATA:  Initial evaluation for neuro deficit, stroke suspected. EXAM: MRI HEAD WITHOUT AND WITH CONTRAST TECHNIQUE: Multiplanar, multiecho pulse sequences of the brain and surrounding structures were obtained without and with intravenous contrast. CONTRAST:  7.72mL GADAVIST GADOBUTROL 1 MMOL/ML IV SOLN COMPARISON:  Previous CT from 01/01/2021 as well as previous brain MRI from 09/16/2020 FINDINGS: Brain: Examination  degraded by motion artifact. Generalized age-related cerebral atrophy. Extensive patchy and confluent T2/FLAIR hyperintensity within the periventricular and deep white matter both cerebral hemispheres most consistent with chronic small vessel ischemic disease, advanced in nature. Multiple remote lacunar infarcts present about the bilateral basal ganglia, thalami, and pons. Chronic hemosiderin staining again noted at the posterior left temporal region. Additional chronic microhemorrhage noted at the right occipital pole. 6 mm focus of diffusion abnormality seen involving the left basal ganglia/corona radiata, consistent with an acute to subacute ischemic infarct. Associated patchy Leon-contrast enhancement consistent with subacute ischemia. No associated hemorrhage or mass effect. There is an additional suspected subtle punctate ischemic infarct involving the left dorsal pons (series 8, image 49). No associated hemorrhage or mass effect. No other evidence for acute or subacute ischemia. Gray-white matter differentiation otherwise maintained. No other areas of remote cortical infarction. No other evidence for acute or chronic intracranial hemorrhage. No mass lesion, midline shift or mass effect. Diffuse ventricular prominence related to global parenchymal volume loss without hydrocephalus. No extra-axial fluid collection. Pituitary gland suprasellar region within normal limits. Midline structures intact. No other abnormal enhancement. Vascular: Major intracranial vascular flow voids are preserved. Skull and upper cervical spine: Craniocervical junction within normal limits. Bone marrow signal intensity normal. No focal marrow replacing lesion. No scalp soft tissue abnormality. Sinuses/Orbits: Globes and orbital soft tissues within normal limits. Paranasal sinuses are largely clear. No significant mastoid effusion. Inner ear structures grossly normal. Other: None. IMPRESSION: 1. 6 mm acute to subacute ischemic infarct  involving the left basal ganglia/corona radiata. No associated hemorrhage or mass effect. 2. Probable additional punctate acute to subacute ischemic infarct ischemic infarct involving the left dorsal pons. 3. Underlying age-related cerebral atrophy with advanced chronic microvascular ischemic disease with multiple remote lacunar infarcts involving the bilateral basal ganglia, thalami, and pons. Electronically Signed   By: Rise Mu M.D.   On: 01/02/2021 02:07   ECHOCARDIOGRAM COMPLETE  Result Date: 01/02/2021    ECHOCARDIOGRAM REPORT  Patient Name:   Lindsey Leon Date of Exam: 01/02/2021 Medical Rec #:  [id number]        Height:       66.0 in Accession #:    [id number]       Weight:       172.4 lb Date of Birth:  12/22/1955        BSA:          1.877 m Patient Age:    64 years         BP:           193/63 mmHg Patient Gender: F                HR:           72 bpm. Exam Location:  Inpatient Procedure: 2D Echo, Cardiac Doppler and Color Doppler Indications:    Stroke I63.9  History:        Patient has prior history of Echocardiogram examinations.                 Stroke; Risk Factors:Hypertension and Diabetes. HIV, Dementia,                 Chronic hepatitis C (HCC) (From Hx).  Sonographer:    Celesta Gentile RCS Referring Phys: [id number] VASUNDHRA RATHORE IMPRESSIONS  1. Left ventricular ejection fraction, by estimation, is 60 to 65%. The left ventricle has normal function. The left ventricle has no regional wall motion abnormalities. There is moderate asymmetric left ventricular hypertrophy. Left ventricular diastolic parameters are consistent with Grade I diastolic dysfunction (impaired relaxation). Elevated left ventricular end-diastolic pressure.  2. Right ventricular systolic function is normal. The right ventricular size is normal. Tricuspid regurgitation signal is inadequate for assessing PA pressure.  3. The mitral valve is degenerative. Mild mitral valve regurgitation. No evidence of mitral  stenosis.  4. The aortic valve is tricuspid. There is moderate calcification of the aortic valve. There is moderate thickening of the aortic valve. Aortic valve regurgitation is mild. No aortic stenosis is present. Aortic regurgitation PHT measures 552 msec. Aortic valve area, by VTI measures 1.75 cm. Aortic valve mean gradient measures 9.5 mmHg. Aortic valve Vmax measures 2.25 m/s.  5. The inferior vena cava is normal in size with greater than 50% respiratory variability, suggesting right atrial pressure of 3 mmHg. FINDINGS  Left Ventricle: Left ventricular ejection fraction, by estimation, is 60 to 65%. The left ventricle has normal function. The left ventricle has no regional wall motion abnormalities. The left ventricular internal cavity size was normal in size. There is  moderate asymmetric left ventricular hypertrophy. Left ventricular diastolic parameters are consistent with Grade I diastolic dysfunction (impaired relaxation). Elevated left ventricular end-diastolic pressure. Right Ventricle: The right ventricular size is normal. No increase in right ventricular wall thickness. Right ventricular systolic function is normal. Tricuspid regurgitation signal is inadequate for assessing PA pressure. Left Atrium: Left atrial size was normal in size. Right Atrium: Right atrial size was normal in size. Pericardium: There is no evidence of pericardial effusion. Mitral Valve: The mitral valve is degenerative in appearance. There is moderate thickening of the mitral valve leaflet(s). Mild to moderate mitral annular calcification. Mild mitral valve regurgitation. No evidence of mitral valve stenosis. Tricuspid Valve: The tricuspid valve is normal in structure. Tricuspid valve regurgitation is trivial. No evidence of tricuspid stenosis. Aortic Valve: The aortic valve is tricuspid. There is moderate calcification of the aortic valve. There is moderate thickening of the aortic  valve. Aortic valve regurgitation is mild.  Aortic regurgitation PHT measures 552 msec. No aortic stenosis is present. Aortic valve mean gradient measures 9.5 mmHg. Aortic valve peak gradient measures 20.2 mmHg. Aortic valve area, by VTI measures 1.75 cm. Pulmonic Valve: The pulmonic valve was normal in structure. Pulmonic valve regurgitation is trivial. No evidence of pulmonic stenosis. Aorta: The aortic root is normal in size and structure. Venous: The inferior vena cava is normal in size with greater than 50% respiratory variability, suggesting right atrial pressure of 3 mmHg. IAS/Shunts: No atrial level shunt detected by color flow Doppler.  LEFT VENTRICLE PLAX 2D LVIDd:         4.60 cm  Diastology LVIDs:         3.10 cm  LV e' medial:    3.77 cm/s LV PW:         1.00 cm  LV E/e' medial:  24.1 LV IVS:        1.40 cm  LV e' lateral:   5.17 cm/s LVOT diam:     1.90 cm  LV E/e' lateral: 17.6 LV SV:         80 LV SV Index:   42 LVOT Area:     2.84 cm  RIGHT VENTRICLE RV S prime:     10.50 cm/s TAPSE (M-mode): 1.6 cm LEFT ATRIUM             Index       RIGHT ATRIUM           Index LA diam:        3.30 cm 1.76 cm/m  RA Area:     11.70 cm LA Vol (A2C):   40.2 ml 21.41 ml/m RA Volume:   24.40 ml  13.00 ml/m LA Vol (A4C):   45.0 ml 23.97 ml/m LA Biplane Vol: 45.2 ml 24.07 ml/m  AORTIC VALVE AV Area (Vmax):    1.41 cm AV Area (Vmean):   1.24 cm AV Area (VTI):     1.75 cm AV Vmax:           225.00 cm/s AV Vmean:          139.000 cm/s AV VTI:            0.454 m AV Peak Grad:      20.2 mmHg AV Mean Grad:      9.5 mmHg LVOT Vmax:         112.00 cm/s LVOT Vmean:        61.000 cm/s LVOT VTI:          0.281 m LVOT/AV VTI ratio: 0.62 AI PHT:            552 msec  AORTA Ao Root diam: 3.10 cm MITRAL VALVE MV Area (PHT): 2.29 cm     SHUNTS MV Decel Time: 331 msec     Systemic VTI:  0.28 m MV E velocity: 90.80 cm/s   Systemic Diam: 1.90 cm MV A velocity: 118.00 cm/s MV E/A ratio:  0.77 Armanda Magic MD Electronically signed by Armanda Magic MD Signature Date/Time:  01/02/2021/1:01:55 PM    Final      Antimicrobials:   None   Subjective: Patient still with double vision and weakness   Objective: Vitals:   01/02/21 0152 01/02/21 0352 01/02/21 0850 01/02/21 1220  BP: (!) 169/78 (!) 181/67 (!) 193/63 (!) 169/49  Pulse: 71 73 72 64  Resp:  20 16 16   Temp: 97.6 F (36.4 C) 97.9 F (36.6  C) 98.3 F (36.8 C) 97.8 F (36.6 C)  TempSrc: Oral Oral Oral Oral  SpO2: 99% 92% 95% 100%    Intake/Output Summary (Last 24 hours) at 01/02/2021 1526 Last data filed at 01/01/2021 2211 Gross per 24 hour  Intake 200 ml  Output --  Net 200 ml   There were no vitals filed for this visit.  Examination:  General exam: Appears calm and comfortable  Respiratory system: Clear to auscultation. Respiratory effort normal. Cardiovascular system: S1 & S2 heard, RRR. No JVD, murmurs, rubs, gallops or clicks. No pedal edema. Gastrointestinal system: Abdomen is nondistended, soft and nontender. No organomegaly or masses felt. Normal bowel sounds heard. Central nervous system: Alert and oriented double vision cranial nerve palsy left eye,. Extremities: Warm well perfused, intact Skin: No rashes, lesions or ulcers Psychiatry: Judgement stable, with limited insight. Mood & affect appropriate.     Data Reviewed: I have personally reviewed following labs and imaging studies  CBC: Recent Labs  Lab 01/01/21 1307  WBC 6.2  NEUTROABS 3.3  HGB 12.8  HCT 37.2  MCV 93.5  PLT 240   Basic Metabolic Panel: Recent Labs  Lab 01/01/21 1307  NA 140  K 4.1  CL 105  CO2 27  GLUCOSE 102*  BUN 11  CREATININE 0.87  CALCIUM 9.9   GFR: Estimated Creatinine Clearance: 69 mL/min (by C-G formula based on SCr of 0.87 mg/dL). Liver Function Tests: Recent Labs  Lab 01/01/21 1307  AST 20  ALT 19  ALKPHOS 46  BILITOT 0.8  PROT 7.4  ALBUMIN 4.2   No results for input(s): LIPASE, AMYLASE in the last 168 hours. No results for input(s): AMMONIA in the last 168  hours. Coagulation Profile: Recent Labs  Lab 01/01/21 1307  INR 1.1   Cardiac Enzymes: No results for input(s): CKTOTAL, CKMB, CKMBINDEX, TROPONINI in the last 168 hours. BNP (last 3 results) No results for input(s): PROBNP in the last 8760 hours. HbA1C: Recent Labs    01/02/21 0326  HGBA1C 6.9*   CBG: Recent Labs  Lab 01/01/21 2247 01/02/21 0716 01/02/21 1253  GLUCAP 92 91 100*   Lipid Profile: Recent Labs    01/02/21 0326  CHOL 94  HDL 36*  LDLCALC 20  TRIG [id number]*  CHOLHDL 2.6   Thyroid Function Tests: No results for input(s): TSH, T4TOTAL, FREET4, T3FREE, THYROIDAB in the last 72 hours. Anemia Panel: No results for input(s): VITAMINB12, FOLATE, FERRITIN, TIBC, IRON, RETICCTPCT in the last 72 hours. Sepsis Labs: No results for input(s): PROCALCITON, LATICACIDVEN in the last 168 hours.  Recent Results (from the past 240 hour(s))  SARS CORONAVIRUS 2 (TAT 6-24 HRS) Nasopharyngeal Nasopharyngeal Swab     Status: None   Collection Time: 01/01/21 11:46 PM   Specimen: Nasopharyngeal Swab  Result Value Ref Range Status   SARS Coronavirus 2 NEGATIVE NEGATIVE Final    Comment: (NOTE) SARS-CoV-2 target nucleic acids are NOT DETECTED.  The SARS-CoV-2 RNA is generally detectable in upper and lower respiratory specimens during the acute phase of infection. Negative results do not preclude SARS-CoV-2 infection, do not rule out co-infections with other pathogens, and should not be used as the sole basis for treatment or other patient management decisions. Negative results must be combined with clinical observations, patient history, and epidemiological information. The expected result is Negative.  Fact Sheet for Patients: HairSlick.nohttps://www.fda.gov/media/138098/download  Fact Sheet for Healthcare Providers: quierodirigir.comhttps://www.fda.gov/media/138095/download  This test is not yet approved or cleared by the [place] FDA and  has been  authorized for detection and/or diagnosis of  SARS-CoV-2 by FDA under an Emergency Use Authorization (EUA). This EUA will remain  in effect (meaning this test can be used) for the duration of the COVID-19 declaration under Se ction 564(b)(1) of the Act, 21 U.S.C. section 360bbb-3(b)(1), unless the authorization is terminated or revoked sooner.  Performed at [facility], [address], [place], [place] [address]          Radiology Studies: CT Angio Head W or Wo Contrast  Result Date: 01/01/2021 CLINICAL DATA:  Initial evaluation for left-sided blurry vision, stroke. EXAM: CT ANGIOGRAPHY HEAD AND NECK TECHNIQUE: Multidetector CT imaging of the head and neck was performed using the standard protocol during bolus administration of intravenous contrast. Multiplanar CT image reconstructions and MIPs were obtained to evaluate the vascular anatomy. Carotid stenosis measurements (when applicable) are obtained utilizing NASCET criteria, using the distal internal carotid diameter as the denominator. CONTRAST:  75mL OMNIPAQUE IOHEXOL 350 MG/ML SOLN COMPARISON:  Prior CT from earlier the same day. FINDINGS: CTA NECK FINDINGS Aortic arch: Visualized aortic arch normal in caliber with normal branch pattern. Moderate atheromatous change about the arch and origin of the great vessels without hemodynamically significant stenosis. Visualized subclavian arteries patent. 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 Distal reconstitution at the right carotid bifurcation via right external carotid artery branches. Heavy calcified plaque at the right bifurcation with associated severe at least 80% stenosis by NASCET criteria. Right ICA somewhat diminutive but patent distally to the skull base. Left carotid system: Scattered eccentric calcified plaque throughout the left carotid artery system without hemodynamically significant stenosis. Vertebral arteries: Both vertebral arteries arise from subclavian arteries. Atheromatous  change about the origins of both vertebral arteries with moderate stenosis on the left and more severe proximal stenosis on the right (series 7, image 270). Vertebral arteries otherwise irregular but patent within the neck. Skeleton: No visible acute osseous finding. Osseous structures are somewhat mottled without discrete osseous lesion. Scattered dental caries noted. Other neck: No other acute soft tissue abnormality within the neck. No mass or adenopathy. Upper chest: Visualized upper chest demonstrates no other acute finding. Review of the MIP images confirms the above findings CTA HEAD FINDINGS Anterior circulation: Petrous segments patent bilaterally. Atheromatous change throughout the carotid siphons with associated stenoses at the supraclinoid segments, moderate on the right, more severe on the left. A1 segments patent bilaterally. Normal anterior communicating artery complex. Anterior cerebral arteries patent to their distal aspects without stenosis. No M1 stenosis or occlusion. No proximal MCA branch occlusion. Distal MCA branches well perfused and fairly symmetric. Posterior circulation: Both V4 segments patent without significant stenosis. Both PICA origins patent and normal. Basilar widely patent. Superior cerebellar arteries patent. Both PCAs primarily supplied via the basilar. Moderate multifocal bilateral P2 stenoses. PCAs remain patent to their distal aspects. Venous sinuses: Patent. Anatomic variants: None significant. Review of the MIP images confirms the above findings IMPRESSION: 1. Stable CTA of the head and neck as compared to 09/16/2020. 2. 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 Additional heavy calcified plaque at the right bifurcation with severe at least 80% stenosis by NASCET criteria. 3. Atheromatous change throughout the carotid siphons with associated bilateral supraclinoid stenoses, moderate on the right and more severe on the  left. 4. Atheromatous plaque about the origins of both vertebral arteries, with associated severe stenosis on the right and more moderate narrowing on the left. Electronically  Signed   By: Rise Mu M.D.   On: 01/01/2021 21:19   CT HEAD WO CONTRAST  Result Date: 01/01/2021 CLINICAL DATA:  Neuro deficit, acute, stroke suspected. Additional provided: Patient reports blurred vision in left eye since 5 a.m. EXAM: CT HEAD WITHOUT CONTRAST TECHNIQUE: Contiguous axial images were obtained from the base of the skull through the vertex without intravenous contrast. COMPARISON:  Brain MRI 09/16/2020. FINDINGS: Brain: Mild cerebral and cerebellar atrophy. Commensurate prominence of the ventricles and sulci. Redemonstrated chronic lacunar infarcts within the deep gray nuclei bilaterally. Redemonstrated chronic small vessel ischemic disease within the pons, including a chronic right pontine lacunar infarct. Advanced patchy and ill-defined hypoattenuation within the cerebral white matter is nonspecific, but compatible with chronic small vessel ischemic disease. There is no acute intracranial hemorrhage. No demarcated cortical infarct. No extra-axial fluid collection. No evidence of intracranial mass. No midline shift. Vascular: No hyperdense vessel.  Atherosclerotic calcifications Skull: Normal. Negative for fracture or focal lesion. Sinuses/Orbits: Visualized orbits show no acute finding. No significant paranasal sinus disease at the imaged levels. IMPRESSION: No evidence of acute intracranial abnormality. Redemonstrated chronic lacunar infarcts within the deep gray nuclei bilaterally. Redemonstrated severe cerebral white matter chronic small vessel ischemic disease. Chronic small vessel ischemic changes also again seen within the a pons, including a chronic right pontine lacunar infarct. Mild generalized parenchymal atrophy. Electronically Signed   By: Jackey Loge DO   On: 01/01/2021 14:54   CT Angio Neck W  and/or Wo Contrast  Result Date: 01/01/2021 CLINICAL DATA:  Initial evaluation for left-sided blurry vision, stroke. EXAM: CT ANGIOGRAPHY HEAD AND NECK TECHNIQUE: Multidetector CT imaging of the head and neck was performed using the standard protocol during bolus administration of intravenous contrast. Multiplanar CT image reconstructions and MIPs were obtained to evaluate the vascular anatomy. Carotid stenosis measurements (when applicable) are obtained utilizing NASCET criteria, using the distal internal carotid diameter as the denominator. CONTRAST:  55mL OMNIPAQUE IOHEXOL 350 MG/ML SOLN COMPARISON:  Prior CT from earlier the same day. FINDINGS: CTA NECK FINDINGS Aortic arch: Visualized aortic arch normal in caliber with normal branch pattern. Moderate atheromatous change about the arch and origin of the great vessels without hemodynamically significant stenosis. Visualized subclavian arteries patent. 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 Distal reconstitution at the right carotid bifurcation via right external carotid artery branches. Heavy calcified plaque at the right bifurcation with associated severe at least 80% stenosis by NASCET criteria. Right ICA somewhat diminutive but patent distally to the skull base. Left carotid system: Scattered eccentric calcified plaque throughout the left carotid artery system without hemodynamically significant stenosis. Vertebral arteries: Both vertebral arteries arise from subclavian arteries. Atheromatous change about the origins of both vertebral arteries with moderate stenosis on the left and more severe proximal stenosis on the right (series 7, image 270). Vertebral arteries otherwise irregular but patent within the neck. Skeleton: No visible acute osseous finding. Osseous structures are somewhat mottled without discrete osseous lesion. Scattered dental caries noted. Other neck: No other acute soft tissue abnormality within the  neck. No mass or adenopathy. Upper chest: Visualized upper chest demonstrates no other acute finding. Review of the MIP images confirms the above findings CTA HEAD FINDINGS Anterior circulation: Petrous segments patent bilaterally. Atheromatous change throughout the carotid siphons with associated stenoses at the supraclinoid segments, moderate on the right, more severe on the left. A1 segments patent bilaterally. Normal anterior communicating artery complex. Anterior cerebral arteries patent to their distal  aspects without stenosis. No M1 stenosis or occlusion. No proximal MCA branch occlusion. Distal MCA branches well perfused and fairly symmetric. Posterior circulation: Both V4 segments patent without significant stenosis. Both PICA origins patent and normal. Basilar widely patent. Superior cerebellar arteries patent. Both PCAs primarily supplied via the basilar. Moderate multifocal bilateral P2 stenoses. PCAs remain patent to their distal aspects. Venous sinuses: Patent. Anatomic variants: None significant. Review of the MIP images confirms the above findings IMPRESSION: 1. Stable CTA of the head and neck as compared to 09/16/2020. 2. 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 Additional heavy calcified plaque at the right bifurcation with severe at least 80% stenosis by NASCET criteria. 3. Atheromatous change throughout the carotid siphons with associated bilateral supraclinoid stenoses, moderate on the right and more severe on the left. 4. Atheromatous plaque about the origins of both vertebral arteries, with associated severe stenosis on the right and more moderate narrowing on the left. Electronically Signed   By: Rise Mu M.D.   On: 01/01/2021 21:19   MR BRAIN W WO CONTRAST  Result Date: 01/02/2021 CLINICAL DATA:  Initial evaluation for neuro deficit, stroke suspected. EXAM: MRI HEAD WITHOUT AND WITH CONTRAST TECHNIQUE: Multiplanar,  multiecho pulse sequences of the brain and surrounding structures were obtained without and with intravenous contrast. CONTRAST:  7.28mL GADAVIST GADOBUTROL 1 MMOL/ML IV SOLN COMPARISON:  Previous CT from 01/01/2021 as well as previous brain MRI from 09/16/2020 FINDINGS: Brain: Examination degraded by motion artifact. Generalized age-related cerebral atrophy. Extensive patchy and confluent T2/FLAIR hyperintensity within the periventricular and deep white matter both cerebral hemispheres most consistent with chronic small vessel ischemic disease, advanced in nature. Multiple remote lacunar infarcts present about the bilateral basal ganglia, thalami, and pons. Chronic hemosiderin staining again noted at the posterior left temporal region. Additional chronic microhemorrhage noted at the right occipital pole. 6 mm focus of diffusion abnormality seen involving the left basal ganglia/corona radiata, consistent with an acute to subacute ischemic infarct. Associated patchy Leon-contrast enhancement consistent with subacute ischemia. No associated hemorrhage or mass effect. There is an additional suspected subtle punctate ischemic infarct involving the left dorsal pons (series 8, image 49). No associated hemorrhage or mass effect. No other evidence for acute or subacute ischemia. Gray-white matter differentiation otherwise maintained. No other areas of remote cortical infarction. No other evidence for acute or chronic intracranial hemorrhage. No mass lesion, midline shift or mass effect. Diffuse ventricular prominence related to global parenchymal volume loss without hydrocephalus. No extra-axial fluid collection. Pituitary gland suprasellar region within normal limits. Midline structures intact. No other abnormal enhancement. Vascular: Major intracranial vascular flow voids are preserved. Skull and upper cervical spine: Craniocervical junction within normal limits. Bone marrow signal intensity normal. No focal marrow  replacing lesion. No scalp soft tissue abnormality. Sinuses/Orbits: Globes and orbital soft tissues within normal limits. Paranasal sinuses are largely clear. No significant mastoid effusion. Inner ear structures grossly normal. Other: None. IMPRESSION: 1. 6 mm acute to subacute ischemic infarct involving the left basal ganglia/corona radiata. No associated hemorrhage or mass effect. 2. Probable additional punctate acute to subacute ischemic infarct ischemic infarct involving the left dorsal pons. 3. Underlying age-related cerebral atrophy with advanced chronic microvascular ischemic disease with multiple remote lacunar infarcts involving the bilateral basal ganglia, thalami, and pons. Electronically Signed   By: Rise Mu M.D.   On: 01/02/2021 02:07   ECHOCARDIOGRAM COMPLETE  Result Date: 01/02/2021    ECHOCARDIOGRAM REPORT   Patient Name:  Lindsey Leon Date of Exam: 01/02/2021 Medical Rec #:  [id number]        Height:       66.0 in Accession #:    [id number]       Weight:       172.4 lb Date of Birth:  [birth year]        BSA:          1.877 m Patient Age:    64 years         BP:           193/63 mmHg Patient Gender: F                HR:           72 bpm. Exam Location:  Inpatient Procedure: 2D Echo, Cardiac Doppler and Color Doppler Indications:    Stroke I63.9  History:        Patient has prior history of Echocardiogram examinations.                 Stroke; Risk Factors:Hypertension and Diabetes. HIV, Dementia,                 Chronic hepatitis C (HCC) (From Hx).  Sonographer:    Celesta Gentile RCS Referring Phys: [id number] VASUNDHRA RATHORE IMPRESSIONS  1. Left ventricular ejection fraction, by estimation, is 60 to 65%. The left ventricle has normal function. The left ventricle has no regional wall motion abnormalities. There is moderate asymmetric left ventricular hypertrophy. Left ventricular diastolic parameters are consistent with Grade I diastolic dysfunction (impaired relaxation). Elevated  left ventricular end-diastolic pressure.  2. Right ventricular systolic function is normal. The right ventricular size is normal. Tricuspid regurgitation signal is inadequate for assessing PA pressure.  3. The mitral valve is degenerative. Mild mitral valve regurgitation. No evidence of mitral stenosis.  4. The aortic valve is tricuspid. There is moderate calcification of the aortic valve. There is moderate thickening of the aortic valve. Aortic valve regurgitation is mild. No aortic stenosis is present. Aortic regurgitation PHT measures 552 msec. Aortic valve area, by VTI measures 1.75 cm. Aortic valve mean gradient measures 9.5 mmHg. Aortic valve Vmax measures 2.25 m/s.  5. The inferior vena cava is normal in size with greater than 50% respiratory variability, suggesting right atrial pressure of 3 mmHg. FINDINGS  Left Ventricle: Left ventricular ejection fraction, by estimation, is 60 to 65%. The left ventricle has normal function. The left ventricle has no regional wall motion abnormalities. The left ventricular internal cavity size was normal in size. There is  moderate asymmetric left ventricular hypertrophy. Left ventricular diastolic parameters are consistent with Grade I diastolic dysfunction (impaired relaxation). Elevated left ventricular end-diastolic pressure. Right Ventricle: The right ventricular size is normal. No increase in right ventricular wall thickness. Right ventricular systolic function is normal. Tricuspid regurgitation signal is inadequate for assessing PA pressure. Left Atrium: Left atrial size was normal in size. Right Atrium: Right atrial size was normal in size. Pericardium: There is no evidence of pericardial effusion. Mitral Valve: The mitral valve is degenerative in appearance. There is moderate thickening of the mitral valve leaflet(s). Mild to moderate mitral annular calcification. Mild mitral valve regurgitation. No evidence of mitral valve stenosis. Tricuspid Valve: The tricuspid  valve is normal in structure. Tricuspid valve regurgitation is trivial. No evidence of tricuspid stenosis. Aortic Valve: The aortic valve is tricuspid. There is moderate calcification of the aortic valve. There is moderate thickening of the aortic valve. Aortic valve  regurgitation is mild. Aortic regurgitation PHT measures 552 msec. No aortic stenosis is present. Aortic valve mean gradient measures 9.5 mmHg. Aortic valve peak gradient measures 20.2 mmHg. Aortic valve area, by VTI measures 1.75 cm. Pulmonic Valve: The pulmonic valve was normal in structure. Pulmonic valve regurgitation is trivial. No evidence of pulmonic stenosis. Aorta: The aortic root is normal in size and structure. Venous: The inferior vena cava is normal in size with greater than 50% respiratory variability, suggesting right atrial pressure of 3 mmHg. IAS/Shunts: No atrial level shunt detected by color flow Doppler.  LEFT VENTRICLE PLAX 2D LVIDd:         4.60 cm  Diastology LVIDs:         3.10 cm  LV e' medial:    3.77 cm/s LV PW:         1.00 cm  LV E/e' medial:  24.1 LV IVS:        1.40 cm  LV e' lateral:   5.17 cm/s LVOT diam:     1.90 cm  LV E/e' lateral: 17.6 LV SV:         80 LV SV Index:   42 LVOT Area:     2.84 cm  RIGHT VENTRICLE RV S prime:     10.50 cm/s TAPSE (M-mode): 1.6 cm LEFT ATRIUM             Index       RIGHT ATRIUM           Index LA diam:        3.30 cm 1.76 cm/m  RA Area:     11.70 cm LA Vol (A2C):   40.2 ml 21.41 ml/m RA Volume:   24.40 ml  13.00 ml/m LA Vol (A4C):   45.0 ml 23.97 ml/m LA Biplane Vol: 45.2 ml 24.07 ml/m  AORTIC VALVE AV Area (Vmax):    1.41 cm AV Area (Vmean):   1.24 cm AV Area (VTI):     1.75 cm AV Vmax:           225.00 cm/s AV Vmean:          139.000 cm/s AV VTI:            0.454 m AV Peak Grad:      20.2 mmHg AV Mean Grad:      9.5 mmHg LVOT Vmax:         112.00 cm/s LVOT Vmean:        61.000 cm/s LVOT VTI:          0.281 m LVOT/AV VTI ratio: 0.62 AI PHT:            552 msec  AORTA Ao Root  diam: 3.10 cm MITRAL VALVE MV Area (PHT): 2.29 cm     SHUNTS MV Decel Time: 331 msec     Systemic VTI:  0.28 m MV E velocity: 90.80 cm/s   Systemic Diam: 1.90 cm MV A velocity: 118.00 cm/s MV E/A ratio:  0.77 Armanda Magic MD Electronically signed by Armanda Magic MD Signature Date/Time: 01/02/2021/1:01:55 PM    Final         Scheduled Meds: . aspirin EC  325 mg Oral Daily  . bictegravir-emtricitabine-tenofovir AF  1 tablet Oral Daily  . enoxaparin (LOVENOX) injection  40 mg Subcutaneous QHS  . gabapentin  900 mg Oral QHS  . insulin aspart  0-15 Units Subcutaneous TID WC  . insulin aspart  0-5 Units Subcutaneous QHS  . insulin glargine  13 Units Subcutaneous QHS  . sodium chloride flush  3 mL Intravenous Once  . ticagrelor  90 mg Oral BID   Continuous Infusions:   LOS: 0 days    Time spent: 47 MIN    Burke Keels, MD [facility]  If 7PM-7AM, please contact night-coverage  01/02/2021, 3:26 PM

## 2021-01-02 NOTE — Progress Notes (Signed)
MEWS should be green for permissive hypertension 

## 2021-01-02 NOTE — ED Notes (Signed)
Patient transported to MRI 

## 2021-01-02 NOTE — Evaluation (Signed)
Physical Therapy Evaluation Patient Details Name: Lindsey Leon MRN: [id number] DOB: [birth year] Today's Date: 01/02/2021   History of Present Illness  Pt is a 65 y.o. female who presented 3/19 with acute onset double vision in her L eye. 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 Head CT negative for acute findings. Stable CTA of the head and neck as compared to 09/16/2020. MRI of head revealed 6 mm acute to subacute ischemic infarct involving the L basal ganglia/corona radiata with probable additional punctate acute to subacute ischemic infarct involving the left dorsal pons and multiple remote lacunar infarcts involving the bilateral basal ganglia, thalami, and pons. PMH: CAD status post CABG, HOCM, well-controlled HIV, chronic hep C, hypertension, pancreatitis, insulin-dependent diabetes, stroke.  Clinical Impression  Pt presents with condition above and deficits mentioned below, see PT Problem List. PTA, she was living at a SNF and performing all functional mobility with mod I using a rollator. Currently, pt's double vision fluctuates with noted poor medial tracking/coordination of her L eye compared to her R, possibly resulting in these visual changes. In addition, pt is at high risk for falls displaying impaired balance, lower extremity strength with L weaker than R, and coordination. Pt needing extensive verbal and tactile cues with minA to weight shift and advance legs with gait, especially with taking steps on the R. Pt needing minA to come to stand due to her lower extremity weakness and imbalance also. Will continue to follow acutely. Recommending pt return to her SNF and receive PT services to address her deficits to maximize her independence and safety with all functional mobility.    Follow Up Recommendations SNF;Supervision/Assistance - 24 hour    Equipment Recommendations  None recommended by PT    Recommendations  for Other Services       Precautions / Restrictions Precautions Precautions: Fall Restrictions Weight Bearing Restrictions: No      Mobility  Bed Mobility Overal bed mobility: Modified Independent             General bed mobility comments: Pt using rails and bed controls to come to sit EOB with extra time, reports this is her baseline.    Transfers Overall transfer level: Needs assistance Equipment used: Rolling walker (2 wheeled) Transfers: Sit to/from Stand Sit to Stand: From elevated surface;Min assist         General transfer comment: Pt unable to come to stand from low level without extensive asisstance, thus raised bed level with pt still needing minA for powering up to stand and steadying.  Ambulation/Gait Ambulation/Gait assistance: Min assist Gait Distance (Feet): 15 Feet Assistive device: Rolling walker (2 wheeled) Gait Pattern/deviations: Step-to pattern;Step-through pattern;Decreased step length - right;Decreased step length - left;Decreased stance time - left;Decreased weight shift to right;Decreased weight shift to left;Trunk flexed Gait velocity: reduced Gait velocity interpretation: <1.31 ft/sec, indicative of household ambulator General Gait Details: Pt ambulating initially with step-to with decreased R step length compared to L. Provided verbal and tactile cues to weight shift appropriately to bil sides to increase stepo length, mod success and carryover. However, continued difficulty coordinating shifting and leg advancement, especially with advancing R leg. Cues to keep R leg within RW at times. MinA for steadying.  Stairs            Wheelchair Mobility    Modified Rankin (Stroke Patients Only) Modified Rankin (Stroke Patients Only) Pre-Morbid Rankin Score: Moderate disability Modified Rankin: Moderately severe disability  Balance Overall balance assessment: Needs assistance Sitting-balance support: No upper extremity supported;Feet  supported Sitting balance-Leahy Scale: Good Sitting balance - Comments: Static sitting EOB no LOB, supervision for safety.   Standing balance support: Bilateral upper extremity supported;During functional activity Standing balance-Leahy Scale: Poor Standing balance comment: Requires UE support on RW.                             Pertinent Vitals/Pain Pain Assessment: No/denies pain    Home Living Family/patient expects to be discharged to:: Skilled nursing facility                 Additional Comments: Facility has various types of equipment available to pts. Pt has a rollator and hospital bed that she uses there.    Prior Function Level of Independence: Needs assistance   Gait / Transfers Assistance Needed: Pt reports being mod I with all functional mobility using a rollator.  ADL's / Homemaking Assistance Needed: Pt reports being independent with bathing self at sink, no set-up needed from staff. Staff cook and clean.        Hand Dominance   Dominant Hand: Right    Extremity/Trunk Assessment   Upper Extremity Assessment Upper Extremity Assessment: Defer to OT evaluation    Lower Extremity Assessment Lower Extremity Assessment: LLE deficits/detail;RLE deficits/detail RLE Deficits / Details: MMT scores of the following: hip flexion 4+, knee extension 5, knee flexion 4+, ankle dorsiflexion 5 RLE Sensation:  (denies numbness/tingling) RLE Coordination: decreased gross motor (no dysmetria or dysdiadochokinesia noted) LLE Deficits / Details: MMT scores of the following: hip flexion 4-, knee extension 5, knee flexion 4, ankle dorsiflexion 5 (pt reports she thinks the L was affected during her last CVA and is unsure whether her weakness now is her baseline) LLE Sensation:  (denies numbness/tingling) LLE Coordination: decreased gross motor (no dysmetria or dysdiadochokinesia noted)       Communication   Communication: Expressive difficulties (difficulty  finding words present from prior stroke, at baseline currently per pt)  Cognition Arousal/Alertness: Lethargic Behavior During Therapy: WFL for tasks assessed/performed Overall Cognitive Status: Within Functional Limits for tasks assessed                                 General Comments: A&Ox4      General Comments General comments (skin integrity, edema, etc.): L eye displays difficulty with medial tracking compared to R, resting either medial/lateral compared to R; Double vision fluctuates; Instructed pt to perform LAQ and seated marching throughout day; educated pt on possible need to transition to RW and obtain assistance from staff with initial return to SNF for safety reasons as she is at risk for falls.    Exercises     Assessment/Plan    PT Assessment Patient needs continued PT services  PT Problem List Decreased strength;Decreased range of motion;Decreased activity tolerance;Decreased balance;Decreased mobility;Decreased coordination;Decreased knowledge of use of DME;Decreased safety awareness       PT Treatment Interventions DME instruction;Gait training;Functional mobility training;Therapeutic activities;Therapeutic exercise;Balance training;Neuromuscular re-education;Patient/family education    PT Goals (Current goals can be found in the Care Plan section)  Acute Rehab PT Goals Patient Stated Goal: to improve PT Goal Formulation: With patient Time For Goal Achievement: 01/16/21 Potential to Achieve Goals: Good    Frequency Min 3X/week   Barriers to discharge        Co-evaluation  AM-PAC PT "6 Clicks" Mobility  Outcome Measure Help needed turning from your back to your side while in a flat bed without using bedrails?: None Help needed moving from lying on your back to sitting on the side of a flat bed without using bedrails?: None Help needed moving to and from a bed to a chair (including a wheelchair)?: A Little Help needed  standing up from a chair using your arms (e.g., wheelchair or bedside chair)?: A Little Help needed to walk in hospital room?: A Little Help needed climbing 3-5 steps with a railing? : Total 6 Click Score: 18    End of Session Equipment Utilized During Treatment: Gait belt Activity Tolerance: Patient tolerated treatment well Patient left: in chair;with call bell/phone within reach;with chair alarm set Nurse Communication: Mobility status PT Visit Diagnosis: Unsteadiness on feet (R26.81);Other abnormalities of gait and mobility (R26.89);Muscle weakness (generalized) (M62.81);Difficulty in walking, not elsewhere classified (R26.2);Other symptoms and signs involving the nervous system (R29.898)    Time: 7342-8768 PT Time Calculation (min) (ACUTE ONLY): 40 min   Charges:   PT Evaluation $PT Eval Moderate Complexity: 1 Mod PT Treatments $Gait Training: 8-22 mins $Therapeutic Activity: 8-22 mins        Raymond Gurney, PT, DPT Acute Rehabilitation Services  Pager: [phone] Office: [phone]   Jewel Baize 01/02/2021, 10:17 AM

## 2021-01-02 NOTE — Progress Notes (Signed)
*  PRELIMINARY RESULTS* Echocardiogram 2D Echocardiogram has been performed.  Stacey Drain 01/02/2021, 12:44 PM

## 2021-01-02 NOTE — ED Notes (Signed)
Patient transported to MRI and still there

## 2021-01-02 NOTE — Progress Notes (Signed)
STROKE TEAM PROGRESS NOTE   SUBJECTIVE (INTERVAL HISTORY) Dr. Lurene Shadow is at the bedside.  Overall her condition is stable. Pt lying in bed, still has diplopia on right gaze. She did have left INO. Otherwise, neuro stable. She stated that she is compliant with medication. She lives in [place] and meds were given regularly. Discussed with her about intracranial stenting given severe intracranial stenosis, she was concerning about the procedure complications and she declined. But she agreed with ASA and brilinta DAPT for further treatment.    OBJECTIVE Temp:  [97.6 F (36.4 C)-98.3 F (36.8 C)] 98.3 F (36.8 C) (03/20 1540) Pulse Rate:  [64-77] 73 (03/20 1540) Cardiac Rhythm: Normal sinus rhythm (03/20 0734) Resp:  [12-28] 16 (03/20 1540) BP: (168-222)/(49-82) 168/63 (03/20 1540) SpO2:  [92 %-100 %] 94 % (03/20 1540)  Recent Labs  Lab 01/01/21 2247 01/02/21 0716 01/02/21 1253 01/02/21 1607  GLUCAP 92 91 100* 113*   Recent Labs  Lab 01/01/21 1307  NA 140  K 4.1  CL 105  CO2 27  GLUCOSE 102*  BUN 11  CREATININE 0.87  CALCIUM 9.9   Recent Labs  Lab 01/01/21 1307  AST 20  ALT 19  ALKPHOS 46  BILITOT 0.8  PROT 7.4  ALBUMIN 4.2   Recent Labs  Lab 01/01/21 1307  WBC 6.2  NEUTROABS 3.3  HGB 12.8  HCT 37.2  MCV 93.5  PLT 240   No results for input(s): CKTOTAL, CKMB, CKMBINDEX, TROPONINI in the last 168 hours. Recent Labs    01/01/21 1307  LABPROT 13.8  INR 1.1   No results for input(s): COLORURINE, LABSPEC, PHURINE, GLUCOSEU, HGBUR, BILIRUBINUR, KETONESUR, PROTEINUR, UROBILINOGEN, NITRITE, LEUKOCYTESUR in the last 72 hours.  Invalid input(s): APPERANCEUR     Component Value Date/Time   CHOL 94 01/02/2021 0326   TRIG 191 (H) 01/02/2021 0326   HDL 36 (L) 01/02/2021 0326   CHOLHDL 2.6 01/02/2021 0326   VLDL 38 01/02/2021 0326   LDLCALC 20 01/02/2021 0326   LDLCALC 81 04/01/2019 1141   Lab Results  Component Value Date   HGBA1C 6.9 (H) 01/02/2021       Component Value Date/Time   LABOPIA NONE DETECTED 12/24/2018 1155   COCAINSCRNUR NONE DETECTED 12/24/2018 1155   LABBENZ NONE DETECTED 12/24/2018 1155   AMPHETMU NONE DETECTED 12/24/2018 1155   THCU NONE DETECTED 12/24/2018 1155   LABBARB NONE DETECTED 12/24/2018 1155    No results for input(s): ETH in the last 168 hours.  I have personally reviewed the radiological images below and agree with the radiology interpretations.  CT Angio Head W or Wo Contrast  Result Date: 01/01/2021 CLINICAL DATA:  Initial evaluation for left-sided blurry vision, stroke. EXAM: CT ANGIOGRAPHY HEAD AND NECK TECHNIQUE: Multidetector CT imaging of the head and neck was performed using the standard protocol during bolus administration of intravenous contrast. Multiplanar CT image reconstructions and MIPs were obtained to evaluate the vascular anatomy. Carotid stenosis measurements (when applicable) are obtained utilizing NASCET criteria, using the distal internal carotid diameter as the denominator. CONTRAST:  75mL OMNIPAQUE IOHEXOL 350 MG/ML SOLN COMPARISON:  Prior CT from earlier the same day. FINDINGS: CTA NECK FINDINGS Aortic arch: Visualized aortic arch normal in caliber with normal branch pattern. Moderate atheromatous change about the arch and origin of the great vessels without hemodynamically significant stenosis. Visualized subclavian arteries patent. 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 Distal reconstitution at the right carotid bifurcation via right external carotid artery branches.  Heavy calcified plaque at the right bifurcation with associated severe at least 80% stenosis by NASCET criteria. Right ICA somewhat diminutive but patent distally to the skull base. Left carotid system: Scattered eccentric calcified plaque throughout the left carotid artery system without hemodynamically significant stenosis. Vertebral arteries: Both vertebral arteries arise from subclavian  arteries. Atheromatous change about the origins of both vertebral arteries with moderate stenosis on the left and more severe proximal stenosis on the right (series 7, image 270). Vertebral arteries otherwise irregular but patent within the neck. Skeleton: No visible acute osseous finding. Osseous structures are somewhat mottled without discrete osseous lesion. Scattered dental caries noted. Other neck: No other acute soft tissue abnormality within the neck. No mass or adenopathy. Upper chest: Visualized upper chest demonstrates no other acute finding. Review of the MIP images confirms the above findings CTA HEAD FINDINGS Anterior circulation: Petrous segments patent bilaterally. Atheromatous change throughout the carotid siphons with associated stenoses at the supraclinoid segments, moderate on the right, more severe on the left. A1 segments patent bilaterally. Normal anterior communicating artery complex. Anterior cerebral arteries patent to their distal aspects without stenosis. No M1 stenosis or occlusion. No proximal MCA branch occlusion. Distal MCA branches well perfused and fairly symmetric. Posterior circulation: Both V4 segments patent without significant stenosis. Both PICA origins patent and normal. Basilar widely patent. Superior cerebellar arteries patent. Both PCAs primarily supplied via the basilar. Moderate multifocal bilateral P2 stenoses. PCAs remain patent to their distal aspects. Venous sinuses: Patent. Anatomic variants: None significant. Review of the MIP images confirms the above findings IMPRESSION: 1. Stable CTA of the head and neck as compared to 09/16/2020. 2. 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 Additional heavy calcified plaque at the right bifurcation with severe at least 80% stenosis by NASCET criteria. 3. Atheromatous change throughout the carotid siphons with associated bilateral supraclinoid stenoses, moderate on the right  and more severe on the left. 4. Atheromatous plaque about the origins of both vertebral arteries, with associated severe stenosis on the right and more moderate narrowing on the left. Electronically Signed   By: Rise Mu M.D.   On: 01/01/2021 21:19   CT HEAD WO CONTRAST  Result Date: 01/01/2021 CLINICAL DATA:  Neuro deficit, acute, stroke suspected. Additional provided: Patient reports blurred vision in left eye since 5 a.m. EXAM: CT HEAD WITHOUT CONTRAST TECHNIQUE: Contiguous axial images were obtained from the base of the skull through the vertex without intravenous contrast. COMPARISON:  Brain MRI 09/16/2020. FINDINGS: Brain: Mild cerebral and cerebellar atrophy. Commensurate prominence of the ventricles and sulci. Redemonstrated chronic lacunar infarcts within the deep gray nuclei bilaterally. Redemonstrated chronic small vessel ischemic disease within the pons, including a chronic right pontine lacunar infarct. Advanced patchy and ill-defined hypoattenuation within the cerebral white matter is nonspecific, but compatible with chronic small vessel ischemic disease. There is no acute intracranial hemorrhage. No demarcated cortical infarct. No extra-axial fluid collection. No evidence of intracranial mass. No midline shift. Vascular: No hyperdense vessel.  Atherosclerotic calcifications Skull: Normal. Negative for fracture or focal lesion. Sinuses/Orbits: Visualized orbits show no acute finding. No significant paranasal sinus disease at the imaged levels. IMPRESSION: No evidence of acute intracranial abnormality. Redemonstrated chronic lacunar infarcts within the deep gray nuclei bilaterally. Redemonstrated severe cerebral white matter chronic small vessel ischemic disease. Chronic small vessel ischemic changes also again seen within the a pons, including a chronic right pontine lacunar infarct. Mild generalized parenchymal atrophy. Electronically Signed  By: Jackey Loge DO   On: 01/01/2021 14:54    CT Angio Neck W and/or Wo Contrast  Result Date: 01/01/2021 CLINICAL DATA:  Initial evaluation for left-sided blurry vision, stroke. EXAM: CT ANGIOGRAPHY HEAD AND NECK TECHNIQUE: Multidetector CT imaging of the head and neck was performed using the standard protocol during bolus administration of intravenous contrast. Multiplanar CT image reconstructions and MIPs were obtained to evaluate the vascular anatomy. Carotid stenosis measurements (when applicable) are obtained utilizing NASCET criteria, using the distal internal carotid diameter as the denominator. CONTRAST:  75mL OMNIPAQUE IOHEXOL 350 MG/ML SOLN COMPARISON:  Prior CT from earlier the same day. FINDINGS: CTA NECK FINDINGS Aortic arch: Visualized aortic arch normal in caliber with normal branch pattern. Moderate atheromatous change about the arch and origin of the great vessels without hemodynamically significant stenosis. Visualized subclavian arteries patent. 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 Distal reconstitution at the right carotid bifurcation via right external carotid artery branches. Heavy calcified plaque at the right bifurcation with associated severe at least 80% stenosis by NASCET criteria. Right ICA somewhat diminutive but patent distally to the skull base. Left carotid system: Scattered eccentric calcified plaque throughout the left carotid artery system without hemodynamically significant stenosis. Vertebral arteries: Both vertebral arteries arise from subclavian arteries. Atheromatous change about the origins of both vertebral arteries with moderate stenosis on the left and more severe proximal stenosis on the right (series 7, image 270). Vertebral arteries otherwise irregular but patent within the neck. Skeleton: No visible acute osseous finding. Osseous structures are somewhat mottled without discrete osseous lesion. Scattered dental caries noted. Other neck: No other acute soft tissue  abnormality within the neck. No mass or adenopathy. Upper chest: Visualized upper chest demonstrates no other acute finding. Review of the MIP images confirms the above findings CTA HEAD FINDINGS Anterior circulation: Petrous segments patent bilaterally. Atheromatous change throughout the carotid siphons with associated stenoses at the supraclinoid segments, moderate on the right, more severe on the left. A1 segments patent bilaterally. Normal anterior communicating artery complex. Anterior cerebral arteries patent to their distal aspects without stenosis. No M1 stenosis or occlusion. No proximal MCA branch occlusion. Distal MCA branches well perfused and fairly symmetric. Posterior circulation: Both V4 segments patent without significant stenosis. Both PICA origins patent and normal. Basilar widely patent. Superior cerebellar arteries patent. Both PCAs primarily supplied via the basilar. Moderate multifocal bilateral P2 stenoses. PCAs remain patent to their distal aspects. Venous sinuses: Patent. Anatomic variants: None significant. Review of the MIP images confirms the above findings IMPRESSION: 1. Stable CTA of the head and neck as compared to 09/16/2020. 2. 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 Additional heavy calcified plaque at the right bifurcation with severe at least 80% stenosis by NASCET criteria. 3. Atheromatous change throughout the carotid siphons with associated bilateral supraclinoid stenoses, moderate on the right and more severe on the left. 4. Atheromatous plaque about the origins of both vertebral arteries, with associated severe stenosis on the right and more moderate narrowing on the left. Electronically Signed   By: Rise Mu M.D.   On: 01/01/2021 21:19   MR BRAIN W WO CONTRAST  Result Date: 01/02/2021 CLINICAL DATA:  Initial evaluation for neuro deficit, stroke suspected. EXAM: MRI HEAD WITHOUT AND WITH CONTRAST  TECHNIQUE: Multiplanar, multiecho pulse sequences of the brain and surrounding structures were obtained without and with intravenous contrast. CONTRAST:  7.67mL GADAVIST GADOBUTROL 1 MMOL/ML IV  SOLN COMPARISON:  Previous CT from 01/01/2021 as well as previous brain MRI from 09/16/2020 FINDINGS: Brain: Examination degraded by motion artifact. Generalized age-related cerebral atrophy. Extensive patchy and confluent T2/FLAIR hyperintensity within the periventricular and deep white matter both cerebral hemispheres most consistent with chronic small vessel ischemic disease, advanced in nature. Multiple remote lacunar infarcts present about the bilateral basal ganglia, thalami, and pons. Chronic hemosiderin staining again noted at the posterior left temporal region. Additional chronic microhemorrhage noted at the right occipital pole. 6 mm focus of diffusion abnormality seen involving the left basal ganglia/corona radiata, consistent with an acute to subacute ischemic infarct. Associated patchy post-contrast enhancement consistent with subacute ischemia. No associated hemorrhage or mass effect. There is an additional suspected subtle punctate ischemic infarct involving the left dorsal pons (series 8, image 49). No associated hemorrhage or mass effect. No other evidence for acute or subacute ischemia. Gray-white matter differentiation otherwise maintained. No other areas of remote cortical infarction. No other evidence for acute or chronic intracranial hemorrhage. No mass lesion, midline shift or mass effect. Diffuse ventricular prominence related to global parenchymal volume loss without hydrocephalus. No extra-axial fluid collection. Pituitary gland suprasellar region within normal limits. Midline structures intact. No other abnormal enhancement. Vascular: Major intracranial vascular flow voids are preserved. Skull and upper cervical spine: Craniocervical junction within normal limits. Bone marrow signal intensity normal.  No focal marrow replacing lesion. No scalp soft tissue abnormality. Sinuses/Orbits: Globes and orbital soft tissues within normal limits. Paranasal sinuses are largely clear. No significant mastoid effusion. Inner ear structures grossly normal. Other: None. IMPRESSION: 1. 6 mm acute to subacute ischemic infarct involving the left basal ganglia/corona radiata. No associated hemorrhage or mass effect. 2. Probable additional punctate acute to subacute ischemic infarct ischemic infarct involving the left dorsal pons. 3. Underlying age-related cerebral atrophy with advanced chronic microvascular ischemic disease with multiple remote lacunar infarcts involving the bilateral basal ganglia, thalami, and pons. Electronically Signed   By: Rise Mu M.D.   On: 01/02/2021 02:07   ECHOCARDIOGRAM COMPLETE  Result Date: 01/02/2021    ECHOCARDIOGRAM REPORT   Patient Name:   NASTACIA RAYBUCK Date of Exam: 01/02/2021 Medical Rec #:  [id number]        Height:       66.0 in Accession #:    [id number]       Weight:       172.4 lb Date of Birth:  1956-07-22        BSA:          1.877 m Patient Age:    64 years         BP:           193/63 mmHg Patient Gender: F                HR:           72 bpm. Exam Location:  Inpatient Procedure: 2D Echo, Cardiac Doppler and Color Doppler Indications:    Stroke I63.9  History:        Patient has prior history of Echocardiogram examinations.                 Stroke; Risk Factors:Hypertension and Diabetes. HIV, Dementia,                 Chronic hepatitis C (HCC) (From Hx).  Sonographer:    Celesta Gentile RCS Referring Phys: [id number] VASUNDHRA RATHORE IMPRESSIONS  1. Left ventricular ejection fraction, by estimation, is 60 to  65%. The left ventricle has normal function. The left ventricle has no regional wall motion abnormalities. There is moderate asymmetric left ventricular hypertrophy. Left ventricular diastolic parameters are consistent with Grade I diastolic dysfunction (impaired  relaxation). Elevated left ventricular end-diastolic pressure.  2. Right ventricular systolic function is normal. The right ventricular size is normal. Tricuspid regurgitation signal is inadequate for assessing PA pressure.  3. The mitral valve is degenerative. Mild mitral valve regurgitation. No evidence of mitral stenosis.  4. The aortic valve is tricuspid. There is moderate calcification of the aortic valve. There is moderate thickening of the aortic valve. Aortic valve regurgitation is mild. No aortic stenosis is present. Aortic regurgitation PHT measures 552 msec. Aortic valve area, by VTI measures 1.75 cm. Aortic valve mean gradient measures 9.5 mmHg. Aortic valve Vmax measures 2.25 m/s.  5. The inferior vena cava is normal in size with greater than 50% respiratory variability, suggesting right atrial pressure of 3 mmHg. FINDINGS  Left Ventricle: Left ventricular ejection fraction, by estimation, is 60 to 65%. The left ventricle has normal function. The left ventricle has no regional wall motion abnormalities. The left ventricular internal cavity size was normal in size. There is  moderate asymmetric left ventricular hypertrophy. Left ventricular diastolic parameters are consistent with Grade I diastolic dysfunction (impaired relaxation). Elevated left ventricular end-diastolic pressure. Right Ventricle: The right ventricular size is normal. No increase in right ventricular wall thickness. Right ventricular systolic function is normal. Tricuspid regurgitation signal is inadequate for assessing PA pressure. Left Atrium: Left atrial size was normal in size. Right Atrium: Right atrial size was normal in size. Pericardium: There is no evidence of pericardial effusion. Mitral Valve: The mitral valve is degenerative in appearance. There is moderate thickening of the mitral valve leaflet(s). Mild to moderate mitral annular calcification. Mild mitral valve regurgitation. No evidence of mitral valve stenosis.  Tricuspid Valve: The tricuspid valve is normal in structure. Tricuspid valve regurgitation is trivial. No evidence of tricuspid stenosis. Aortic Valve: The aortic valve is tricuspid. There is moderate calcification of the aortic valve. There is moderate thickening of the aortic valve. Aortic valve regurgitation is mild. Aortic regurgitation PHT measures 552 msec. No aortic stenosis is present. Aortic valve mean gradient measures 9.5 mmHg. Aortic valve peak gradient measures 20.2 mmHg. Aortic valve area, by VTI measures 1.75 cm. Pulmonic Valve: The pulmonic valve was normal in structure. Pulmonic valve regurgitation is trivial. No evidence of pulmonic stenosis. Aorta: The aortic root is normal in size and structure. Venous: The inferior vena cava is normal in size with greater than 50% respiratory variability, suggesting right atrial pressure of 3 mmHg. IAS/Shunts: No atrial level shunt detected by color flow Doppler.  LEFT VENTRICLE PLAX 2D LVIDd:         4.60 cm  Diastology LVIDs:         3.10 cm  LV e' medial:    3.77 cm/s LV PW:         1.00 cm  LV E/e' medial:  24.1 LV IVS:        1.40 cm  LV e' lateral:   5.17 cm/s LVOT diam:     1.90 cm  LV E/e' lateral: 17.6 LV SV:         80 LV SV Index:   42 LVOT Area:     2.84 cm  RIGHT VENTRICLE RV S prime:     10.50 cm/s TAPSE (M-mode): 1.6 cm LEFT ATRIUM  Index       RIGHT ATRIUM           Index LA diam:        3.30 cm 1.76 cm/m  RA Area:     11.70 cm LA Vol (A2C):   40.2 ml 21.41 ml/m RA Volume:   24.40 ml  13.00 ml/m LA Vol (A4C):   45.0 ml 23.97 ml/m LA Biplane Vol: 45.2 ml 24.07 ml/m  AORTIC VALVE AV Area (Vmax):    1.41 cm AV Area (Vmean):   1.24 cm AV Area (VTI):     1.75 cm AV Vmax:           225.00 cm/s AV Vmean:          139.000 cm/s AV VTI:            0.454 m AV Peak Grad:      20.2 mmHg AV Mean Grad:      9.5 mmHg LVOT Vmax:         112.00 cm/s LVOT Vmean:        61.000 cm/s LVOT VTI:          0.281 m LVOT/AV VTI ratio: 0.62 AI PHT:             552 msec  AORTA Ao Root diam: 3.10 cm MITRAL VALVE MV Area (PHT): 2.29 cm     SHUNTS MV Decel Time: 331 msec     Systemic VTI:  0.28 m MV E velocity: 90.80 cm/s   Systemic Diam: 1.90 cm MV A velocity: 118.00 cm/s MV E/A ratio:  0.77 Gloris Manchesterraci Turner MD Electronically signed by Armanda Magicraci Turner MD Signature Date/Time: 01/02/2021/1:01:55 PM    Final     PHYSICAL EXAM  Temp:  [97.6 F (36.4 C)-98.3 F (36.8 C)] 98.3 F (36.8 C) (03/20 1540) Pulse Rate:  [64-77] 73 (03/20 1540) Resp:  [12-28] 16 (03/20 1540) BP: (168-222)/(49-82) 168/63 (03/20 1540) SpO2:  [92 %-100 %] 94 % (03/20 1540)  General - Well nourished, well developed, in no apparent distress.  Ophthalmologic - fundi not visualized due to noncooperation.  Cardiovascular - Regular rhythm and rate.  Mental Status -  Level of arousal and orientation to time, place were intact. She was confused about  her age but correct on the 3rd attempt.  Language including expression, naming, repetition, comprehension was assessed and found intact.  Cranial Nerves II - XII - II - Visual field intact OU. III, IV, VI - Extraocular movements exam showed left INO V - Facial sensation intact bilaterally. VII - Facial movement intact bilaterally. VIII - Hearing & vestibular intact bilaterally. X - Palate elevates symmetrically. XI - Chin turning & shoulder shrug intact bilaterally. XII - Tongue protrusion intact.  Motor Strength - The patient's strength was normal in all extremities and pronator drift was absent.  Bulk was normal and fasciculations were absent.   Motor Tone - Muscle tone was assessed at the neck and appendages and was normal.  Reflexes - The patient's reflexes were symmetrical in all extremities and she had no pathological reflexes.  Sensory - Light touch, temperature/pinprick were assessed and were symmetrical.    Coordination - The patient had normal movements in the hands with no ataxia or dysmetria.  Tremor was  absent.  Gait and Station - deferred.   ASSESSMENT/PLAN Ms. Eulas PostLinda M Licciardi is a 65 y.o. female with history of hypertension, ICH, former smoker, HIV, TIAs, hepC, DM admitted for double vision. No tPA given due to  outside window.    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  CTA head and neck chronic right CCA occlusion, right ICA 80% stenosis, left more than right siphon stenosis, bilateral VA origin stenosis right more than left.  MRI acute left BG/CR and probable left dorsal pontine infarct.  Multifocal old lacunar infarcts including bilateral BG, thalamus and pontine.  2D Echo EF 60 to 65%  LDL 20  HgbA1c 6.9  Lovenox for VTE prophylaxis  aspirin 325 mg daily and clopidogrel 75 mg daily prior to admission, now on aspirin 81 mg daily and Brilinta (ticagrelor) 90 mg bid DAPT for 30 days and then palvix alone.   Patient counseled to be compliant with her antithrombotic medications  Ongoing aggressive stroke risk factor management  Therapy recommendations:  SNF  Disposition:  pending  History of TIAs  12/07/2018 admitted for transient speech difficulty.  Consider possible left brain TIA.  CT negative.  MRI showed no acute stroke, old bilateral BG/thalamic infarcts.  CTA head neck right ICA severe stenosis tandem right CCA stenosis, bilateral P2 and the VA origin stenosis.  LDL 26, A1c 5.8.  UDS negative.  Put on DAPT for 3 weeks.  12/2018 admitted for left brain TIA versus complicated migraine.  MRI negative for acute stroke.  CTA head and neck no LVO, right M3 stenosis, right CCA chronic occlusion, right ICA decreased flow, bilateral VA and PCA stenosis.  EEG negative for seizure.  Continue on DAPT for 3 weeks.  No statin due to low LDL.  Extra- and intraintracranial vascular  stenosis  05/2014 carotid Doppler showed right ICA 60 to 79% stenosis  11/2018 right CCA and ICA tandem critical stenosis confirmed on CTA.  Bilateral P2 and VA origin stenosis  12/2018 right CCA progressed to occlusion.  Right ICA patent from external to internal retrograde flow.  Right M3 stenosis.   09/2020 CT head and neck chronic right CCA occlusion, right ICA supplied through questionable internal carotid artery collaterals.  Severe stenosis proximal right ICA.  Moderate stenosis supraclinoid ICA bilaterally left greater than right.  12/2020 chronic right CCA occlusion, right ICA 80% stenosis, left more than right siphon stenosis, bilateral VA origin stenosis right more than left.  Discussed with patient regarding left ICA siphon stenting given current left CR infarct as well as previous left brain TIAs, patient concerning about procedure complication, declined intracranial stent at this time.  Recommend aspirin 81 and Brilinta 90 twice daily for 30 days and then Plavix alone.  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 UDS positive THC. 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 2D echo unremarkable. 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  Hypertension  Stable on the high end  Gradually decrease BP to goal within 2 to 3 days  Long term BP goal  130-150 given severe extra- and intracranial stenosis  LDL management  Home meds: none  LDL 26, goal < 70  No statin needed at this time due to low LDL  Other Stroke Risk Factors  Advanced age  Former cigarette smoker, advised to stop smoking  Other Active Problems  HIV on HAART  Hospital day # 0  Neurology will sign off. Please call with questions. Pt  will follow up with stroke clinic Dr. Pearlean Brownie at [facility] in about 4 weeks. Thanks for the consult.   Marvel Plan, MD PhD Stroke Neurology 01/02/2021 4:29 PM    To  contact Stroke Continuity provider, please refer to [email or web address]. After hours, contact General Neurology

## 2021-01-03 DIAGNOSIS — I10 Essential (primary) hypertension: Secondary | ICD-10-CM | POA: Diagnosis not present

## 2021-01-03 LAB — GLUCOSE, CAPILLARY
Glucose-Capillary: 135 mg/dL — ABNORMAL HIGH (ref 70–99)
Glucose-Capillary: 147 mg/dL — ABNORMAL HIGH (ref 70–99)

## 2021-01-03 MED ORDER — TICAGRELOR 90 MG PO TABS: 90.0000 mg | ORAL_TABLET | Freq: Two times a day (BID) | ORAL | 0 refills | Status: AC

## 2021-01-03 MED ORDER — ASPIRIN 81 MG PO TBEC: 81.0000 mg | DELAYED_RELEASE_TABLET | Freq: Every day | ORAL | 0 refills | Status: AC

## 2021-01-03 MED ORDER — CLOPIDOGREL BISULFATE 75 MG PO TABS: 75.0000 mg | ORAL_TABLET | Freq: Every day | ORAL | 0 refills | Status: DC

## 2021-01-03 NOTE — TOC Transition Note (Signed)
Transition of Care [facility]) - CM/SW Discharge Note   Patient Details  Name: ZSAZSA BAHENA MRN: [id number] Date of Birth: March 19, 1956  Transition of Care [facility]) CM/SW Contact:  Baldemar Lenis, LCSW Phone Number: 01/03/2021, 10:49 AM   Clinical Narrative:   Nurse to call report to [phone].    Final next level of care: Skilled Nursing Facility Barriers to Discharge: Barriers Resolved   Patient Goals and CMS Choice Patient states their goals for this hospitalization and ongoing recovery are:: get back to [email or web address] Compare Post Acute Care list provided to:: Patient Choice offered to / list presented to : Patient  Discharge Placement              Patient chooses bed at: [facility] Patient to be transferred to facility by: [facility] Name of family member notified: Left a voicemail for [facility] Patient and family notified of of transfer: 01/03/21  Discharge Plan and Services                                     Social Determinants of Health (SDOH) Interventions     Readmission Risk Interventions No flowsheet data found.

## 2021-01-03 NOTE — Progress Notes (Signed)
Pt safely discharged. [facility] transport for pick up. Discharge packet provided. VS wnL for patient and as per flow. IV removed, discharge instructions provided and all questions and concerns addressed. Pt verbalized understanding.

## 2021-01-03 NOTE — Discharge Summary (Signed)
Physician Discharge Summary  [name]:[id number] DOB: 1956/09/07 DOA: 01/01/2021  PCP: Default, Provider, MD  Admit date: 01/01/2021  Discharge date: 01/03/2021  Admitted From:SNF  Disposition:  SNF  Recommendations for Outpatient Follow-up:  1. Follow up with PCP in 1-2 weeks 2. Follow-up with neurology Dr. Pearlean Brownie in 4 weeks 3. Stop further use of statin as LDL was quite low 4. Continue on aspirin and Brilinta for 30 days as prescribed and then resume Plavix alone thereafter  Home Health: None  Equipment/Devices: None  Discharge Condition:Stable  CODE STATUS: Full  Diet recommendation: Heart Healthy  Brief/Interim Summary: Per HPI: Lindsey Leon a 65 y.o.femalewith medical history significant ofCAD status post CABG, HOCM,well-controlled HIV, chronic hep C, hypertension, pancreatitis, insulin-dependent diabetes, stroke presented to the ED for evaluation of acute onset double vision.Patient states she woke up around 4 or 5 AM this morning to use the bathroom and noticed that she was having double vision in her left eye which then persisted throughout the day and has now resolved. She did not have any weakness in her arm or leg did not have numbness anywhere. Did not have difficulty speaking. States she takes all of her home medications but her blood pressure is always high. Patient has no other complaints. Denies cough, shortness of breath, or chest pain. She is fully vaccinated against COVID.  ED Course:Neurology consulted. Hypertensive with systolic up to 220s. Labs showing WBC 6.2, hemoglobin 12.8, platelet count 240K.Sodium 140, potassium 4.1, chloride 105, bicarb 27, BUN 11, creatinine 0.8, glucose 102. INR 1.1. Head CT negative for acute finding. Patient was given Plavix 75 mg and her home antihypertensives including losartan and metoprolol.  Acute CVA with diplopia,leftcranial nerve III palsy:  -MRI of the brainwith and without contrast  shows acute CVA -Discussed with stroke team, recommended discontinuation of Plavix and starting Brilinta with aspirin for 30 days and then Plavix only after that. -  2D echocardiogram did not identify PFO, normal EF 60-65 grade 1 diastolic dysfunction -PT recommending SNF-patient currently resident at [facility]  Well-controlled HIV: Labs done 12/27/2020 showing CD4 count 622 and undetectable viral load. -ResumeD home medications Outpatient infectious disease follow-up.  Chronic hep C -Outpatient infectious disease follow-up.  Hypertensive urgency-likely secondary to above.  Okay to resume home blood pressure medications on discharge.  Insulin-dependent diabetes/neuropathy -A1c 6.9%.sliding scale insulin moderate ACHS.Patient takes Lantus 13 units at bedtime and gabapentin 900 mg at bedtime, continue.  CAD: Not endorsing any anginal symptoms.  No need for further statin given LDL of 20.   Discharge Diagnoses:  Principal Problem:   Diplopia Active Problems:   Human immunodeficiency virus (HIV) disease (HCC)   CVA (cerebral vascular accident) (HCC)   Chronic hepatitis C without hepatic coma (HCC)   Cranial nerve III palsy, left   HTN (hypertension)    Discharge Instructions  Discharge Instructions    Ambulatory referral to Neurology   Complete by: As directed    Follow up with Dr. Pearlean Brownie at [facility] in 4-6 weeks. Too complicated for RN to follow. Thanks.   Diet - low sodium heart healthy   Complete by: As directed    Increase activity slowly   Complete by: As directed      Allergies as of 01/03/2021   No Known Allergies     Medication List    STOP taking these medications   aspirin 325 MG tablet Replaced by: aspirin 81 MG EC tablet   atorvastatin 40 MG tablet Commonly known as: LIPITOR  TAKE these medications   acetaminophen 325 MG tablet Commonly known as: TYLENOL Take 650 mg by mouth 2 (two) times daily as needed for moderate pain or mild pain.    aspirin 81 MG EC tablet Take 1 tablet (81 mg total) by mouth daily. Swallow whole. Replaces: aspirin 325 MG tablet   Biktarvy 50-200-25 MG Tabs tablet Generic drug: bictegravir-emtricitabine-tenofovir AF TAKE 1 TABLET BY MOUTH DAILY.   clopidogrel 75 MG tablet Commonly known as: PLAVIX Take 1 tablet (75 mg total) by mouth daily. Start taking on: February 03, 2021 What changed: These instructions start on February 03, 2021. If you are unsure what to do until then, ask your doctor or other care provider.   ferrous gluconate 324 MG tablet Commonly known as: FERGON Take 324 mg by mouth daily with breakfast.   gabapentin 100 MG capsule Commonly known as: NEURONTIN Take 100 mg by mouth See admin instructions. Take 1 capsule by mouth twice daily at 4PM and bedtime. Take bedtime dose with a 800mg  capsule for a total dose of 900mg  at bedtime.   gabapentin 800 MG tablet Commonly known as: NEURONTIN Take 800 mg by mouth at bedtime. Take with a 100mg  capsule to equal 100mg  at bedtime.   gabapentin 300 MG capsule Commonly known as: NEURONTIN Take 300 mg by mouth daily.   insulin glargine 100 UNIT/ML injection Commonly known as: LANTUS Inject 13 Units into the skin at bedtime.   losartan 50 MG tablet Commonly known as: COZAAR Take 75 mg by mouth daily at 12 noon.   metoprolol succinate 50 MG 24 hr tablet Commonly known as: TOPROL-XL Take 50 mg by mouth daily. Take with or immediately following a meal.   PEPCID PO Take 1 tablet by mouth daily.   polyethylene glycol powder 17 GM/SCOOP powder Commonly known as: GLYCOLAX/MIRALAX Take 17 g by mouth daily as needed for mild constipation or moderate constipation. Mix in 4-6 oz of liquid and take by mouth   sennosides-docusate sodium 8.6-50 MG tablet Commonly known as: SENOKOT-S Take 2 tablets by mouth at bedtime.   ticagrelor 90 MG Tabs tablet Commonly known as: BRILINTA Take 1 tablet (90 mg total) by mouth 2 (two) times daily.   TUMS  ULTRA 1000 PO Take 2 tablets by mouth daily as needed (indigestion).   Vitamin B Complex Tabs Take 1 tablet by mouth daily with breakfast.   Vitamin D 50 MCG (2000 UT) tablet Take 2,000 Units by mouth daily.       Follow-up Information    Micki RileySethi, Pramod S, MD. Schedule an appointment as soon as possible for a visit in 4 week(s).   Specialties: Neurology, Radiology Contact information: [address] [place] [id number] [phone]              No Known Allergies  Consultations:  Neurology   Procedures/Studies: CT Angio Head W or Wo Contrast  Result Date: 01/01/2021 CLINICAL DATA:  Initial evaluation for left-sided blurry vision, stroke. EXAM: CT ANGIOGRAPHY HEAD AND NECK TECHNIQUE: Multidetector CT imaging of the head and neck was performed using the standard protocol during bolus administration of intravenous contrast. Multiplanar CT image reconstructions and MIPs were obtained to evaluate the vascular anatomy. Carotid stenosis measurements (when applicable) are obtained utilizing NASCET criteria, using the distal internal carotid diameter as the denominator. CONTRAST:  75mL OMNIPAQUE IOHEXOL 350 MG/ML SOLN COMPARISON:  Prior CT from earlier the same day. FINDINGS: CTA NECK FINDINGS Aortic arch: Visualized aortic arch normal in caliber with  normal branch pattern. Moderate atheromatous change about the arch and origin of the great vessels without hemodynamically significant stenosis. Visualized subclavian arteries patent. 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 Distal reconstitution at the right carotid bifurcation via right external carotid artery branches. Heavy calcified plaque at the right bifurcation with associated severe at least 80% stenosis by NASCET criteria. Right ICA somewhat diminutive but patent distally to the skull base. Left carotid system: Scattered eccentric calcified plaque throughout the left carotid artery  system without hemodynamically significant stenosis. Vertebral arteries: Both vertebral arteries arise from subclavian arteries. Atheromatous change about the origins of both vertebral arteries with moderate stenosis on the left and more severe proximal stenosis on the right (series 7, image 270). Vertebral arteries otherwise irregular but patent within the neck. Skeleton: No visible acute osseous finding. Osseous structures are somewhat mottled without discrete osseous lesion. Scattered dental caries noted. Other neck: No other acute soft tissue abnormality within the neck. No mass or adenopathy. Upper chest: Visualized upper chest demonstrates no other acute finding. Review of the MIP images confirms the above findings CTA HEAD FINDINGS Anterior circulation: Petrous segments patent bilaterally. Atheromatous change throughout the carotid siphons with associated stenoses at the supraclinoid segments, moderate on the right, more severe on the left. A1 segments patent bilaterally. Normal anterior communicating artery complex. Anterior cerebral arteries patent to their distal aspects without stenosis. No M1 stenosis or occlusion. No proximal MCA branch occlusion. Distal MCA branches well perfused and fairly symmetric. Posterior circulation: Both V4 segments patent without significant stenosis. Both PICA origins patent and normal. Basilar widely patent. Superior cerebellar arteries patent. Both PCAs primarily supplied via the basilar. Moderate multifocal bilateral P2 stenoses. PCAs remain patent to their distal aspects. Venous sinuses: Patent. Anatomic variants: None significant. Review of the MIP images confirms the above findings IMPRESSION: 1. Stable CTA of the head and neck as compared to 09/16/2020. 2. 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 Additional heavy calcified plaque at the right bifurcation with severe at least 80% stenosis by NASCET criteria. 3.  Atheromatous change throughout the carotid siphons with associated bilateral supraclinoid stenoses, moderate on the right and more severe on the left. 4. Atheromatous plaque about the origins of both vertebral arteries, with associated severe stenosis on the right and more moderate narrowing on the left. Electronically Signed   By: Rise Mu M.D.   On: 01/01/2021 21:19   CT HEAD WO CONTRAST  Result Date: 01/01/2021 CLINICAL DATA:  Neuro deficit, acute, stroke suspected. Additional provided: Patient reports blurred vision in left eye since 5 a.m. EXAM: CT HEAD WITHOUT CONTRAST TECHNIQUE: Contiguous axial images were obtained from the base of the skull through the vertex without intravenous contrast. COMPARISON:  Brain MRI 09/16/2020. FINDINGS: Brain: Mild cerebral and cerebellar atrophy. Commensurate prominence of the ventricles and sulci. Redemonstrated chronic lacunar infarcts within the deep gray nuclei bilaterally. Redemonstrated chronic small vessel ischemic disease within the pons, including a chronic right pontine lacunar infarct. Advanced patchy and ill-defined hypoattenuation within the cerebral white matter is nonspecific, but compatible with chronic small vessel ischemic disease. There is no acute intracranial hemorrhage. No demarcated cortical infarct. No extra-axial fluid collection. No evidence of intracranial mass. No midline shift. Vascular: No hyperdense vessel.  Atherosclerotic calcifications Skull: Normal. Negative for fracture or focal lesion. Sinuses/Orbits: Visualized orbits show no acute finding. No significant paranasal sinus disease at the imaged levels. IMPRESSION: No evidence of acute intracranial  abnormality. Redemonstrated chronic lacunar infarcts within the deep gray nuclei bilaterally. Redemonstrated severe cerebral white matter chronic small vessel ischemic disease. Chronic small vessel ischemic changes also again seen within the a pons, including a chronic right pontine  lacunar infarct. Mild generalized parenchymal atrophy. Electronically Signed   By: Jackey Loge DO   On: 01/01/2021 14:54   CT Angio Neck W and/or Wo Contrast  Result Date: 01/01/2021 CLINICAL DATA:  Initial evaluation for left-sided blurry vision, stroke. EXAM: CT ANGIOGRAPHY HEAD AND NECK TECHNIQUE: Multidetector CT imaging of the head and neck was performed using the standard protocol during bolus administration of intravenous contrast. Multiplanar CT image reconstructions and MIPs were obtained to evaluate the vascular anatomy. Carotid stenosis measurements (when applicable) are obtained utilizing NASCET criteria, using the distal internal carotid diameter as the denominator. CONTRAST:  75mL OMNIPAQUE IOHEXOL 350 MG/ML SOLN COMPARISON:  Prior CT from earlier the same day. FINDINGS: CTA NECK FINDINGS Aortic arch: Visualized aortic arch normal in caliber with normal branch pattern. Moderate atheromatous change about the arch and origin of the great vessels without hemodynamically significant stenosis. Visualized subclavian arteries patent. 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 Distal reconstitution at the right carotid bifurcation via right external carotid artery branches. Heavy calcified plaque at the right bifurcation with associated severe at least 80% stenosis by NASCET criteria. Right ICA somewhat diminutive but patent distally to the skull base. Left carotid system: Scattered eccentric calcified plaque throughout the left carotid artery system without hemodynamically significant stenosis. Vertebral arteries: Both vertebral arteries arise from subclavian arteries. Atheromatous change about the origins of both vertebral arteries with moderate stenosis on the left and more severe proximal stenosis on the right (series 7, image 270). Vertebral arteries otherwise irregular but patent within the neck. Skeleton: No visible acute osseous finding. Osseous structures are  somewhat mottled without discrete osseous lesion. Scattered dental caries noted. Other neck: No other acute soft tissue abnormality within the neck. No mass or adenopathy. Upper chest: Visualized upper chest demonstrates no other acute finding. Review of the MIP images confirms the above findings CTA HEAD FINDINGS Anterior circulation: Petrous segments patent bilaterally. Atheromatous change throughout the carotid siphons with associated stenoses at the supraclinoid segments, moderate on the right, more severe on the left. A1 segments patent bilaterally. Normal anterior communicating artery complex. Anterior cerebral arteries patent to their distal aspects without stenosis. No M1 stenosis or occlusion. No proximal MCA branch occlusion. Distal MCA branches well perfused and fairly symmetric. Posterior circulation: Both V4 segments patent without significant stenosis. Both PICA origins patent and normal. Basilar widely patent. Superior cerebellar arteries patent. Both PCAs primarily supplied via the basilar. Moderate multifocal bilateral P2 stenoses. PCAs remain patent to their distal aspects. Venous sinuses: Patent. Anatomic variants: None significant. Review of the MIP images confirms the above findings IMPRESSION: 1. Stable CTA of the head and neck as compared to 09/16/2020. 2. 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 Additional heavy calcified plaque at the right bifurcation with severe at least 80% stenosis by NASCET criteria. 3. Atheromatous change throughout the carotid siphons with associated bilateral supraclinoid stenoses, moderate on the right and more severe on the left. 4. Atheromatous plaque about the origins of both vertebral arteries, with associated severe stenosis on the right and more moderate narrowing on the left. Electronically Signed   By: Rise Mu M.D.   On: 01/01/2021 21:19   MR BRAIN W WO CONTRAST  Result Date:  01/02/2021 CLINICAL DATA:  Initial evaluation for neuro deficit, stroke suspected. EXAM: MRI HEAD WITHOUT AND WITH CONTRAST TECHNIQUE: Multiplanar, multiecho pulse sequences of the brain and surrounding structures were obtained without and with intravenous contrast. CONTRAST:  7.18mL GADAVIST GADOBUTROL 1 MMOL/ML IV SOLN COMPARISON:  Previous CT from 01/01/2021 as well as previous brain MRI from 09/16/2020 FINDINGS: Brain: Examination degraded by motion artifact. Generalized age-related cerebral atrophy. Extensive patchy and confluent T2/FLAIR hyperintensity within the periventricular and deep white matter both cerebral hemispheres most consistent with chronic small vessel ischemic disease, advanced in nature. Multiple remote lacunar infarcts present about the bilateral basal ganglia, thalami, and pons. Chronic hemosiderin staining again noted at the posterior left temporal region. Additional chronic microhemorrhage noted at the right occipital pole. 6 mm focus of diffusion abnormality seen involving the left basal ganglia/corona radiata, consistent with an acute to subacute ischemic infarct. Associated patchy post-contrast enhancement consistent with subacute ischemia. No associated hemorrhage or mass effect. There is an additional suspected subtle punctate ischemic infarct involving the left dorsal pons (series 8, image 49). No associated hemorrhage or mass effect. No other evidence for acute or subacute ischemia. Gray-white matter differentiation otherwise maintained. No other areas of remote cortical infarction. No other evidence for acute or chronic intracranial hemorrhage. No mass lesion, midline shift or mass effect. Diffuse ventricular prominence related to global parenchymal volume loss without hydrocephalus. No extra-axial fluid collection. Pituitary gland suprasellar region within normal limits. Midline structures intact. No other abnormal enhancement. Vascular: Major intracranial vascular flow voids are  preserved. Skull and upper cervical spine: Craniocervical junction within normal limits. Bone marrow signal intensity normal. No focal marrow replacing lesion. No scalp soft tissue abnormality. Sinuses/Orbits: Globes and orbital soft tissues within normal limits. Paranasal sinuses are largely clear. No significant mastoid effusion. Inner ear structures grossly normal. Other: None. IMPRESSION: 1. 6 mm acute to subacute ischemic infarct involving the left basal ganglia/corona radiata. No associated hemorrhage or mass effect. 2. Probable additional punctate acute to subacute ischemic infarct ischemic infarct involving the left dorsal pons. 3. Underlying age-related cerebral atrophy with advanced chronic microvascular ischemic disease with multiple remote lacunar infarcts involving the bilateral basal ganglia, thalami, and pons. Electronically Signed   By: Rise Mu M.D.   On: 01/02/2021 02:07   ECHOCARDIOGRAM COMPLETE  Result Date: 01/02/2021    ECHOCARDIOGRAM REPORT   Patient Name:   Lindsey Leon Date of Exam: 01/02/2021 Medical Rec #:  [id number]        Height:       66.0 in Accession #:    [id number]       Weight:       172.4 lb Date of Birth:  [birth year]        BSA:          1.877 m Patient Age:    64 years         BP:           193/63 mmHg Patient Gender: F                HR:           72 bpm. Exam Location:  Inpatient Procedure: 2D Echo, Cardiac Doppler and Color Doppler Indications:    Stroke I63.9  History:        Patient has prior history of Echocardiogram examinations.                 Stroke; Risk Factors:Hypertension and Diabetes. HIV, Dementia,  Chronic hepatitis C (HCC) (From Hx).  Sonographer:    Celesta Gentile RCS Referring Phys: [id number] VASUNDHRA RATHORE IMPRESSIONS  1. Left ventricular ejection fraction, by estimation, is 60 to 65%. The left ventricle has normal function. The left ventricle has no regional wall motion abnormalities. There is moderate asymmetric left  ventricular hypertrophy. Left ventricular diastolic parameters are consistent with Grade I diastolic dysfunction (impaired relaxation). Elevated left ventricular end-diastolic pressure.  2. Right ventricular systolic function is normal. The right ventricular size is normal. Tricuspid regurgitation signal is inadequate for assessing PA pressure.  3. The mitral valve is degenerative. Mild mitral valve regurgitation. No evidence of mitral stenosis.  4. The aortic valve is tricuspid. There is moderate calcification of the aortic valve. There is moderate thickening of the aortic valve. Aortic valve regurgitation is mild. No aortic stenosis is present. Aortic regurgitation PHT measures 552 msec. Aortic valve area, by VTI measures 1.75 cm. Aortic valve mean gradient measures 9.5 mmHg. Aortic valve Vmax measures 2.25 m/s.  5. The inferior vena cava is normal in size with greater than 50% respiratory variability, suggesting right atrial pressure of 3 mmHg. FINDINGS  Left Ventricle: Left ventricular ejection fraction, by estimation, is 60 to 65%. The left ventricle has normal function. The left ventricle has no regional wall motion abnormalities. The left ventricular internal cavity size was normal in size. There is  moderate asymmetric left ventricular hypertrophy. Left ventricular diastolic parameters are consistent with Grade I diastolic dysfunction (impaired relaxation). Elevated left ventricular end-diastolic pressure. Right Ventricle: The right ventricular size is normal. No increase in right ventricular wall thickness. Right ventricular systolic function is normal. Tricuspid regurgitation signal is inadequate for assessing PA pressure. Left Atrium: Left atrial size was normal in size. Right Atrium: Right atrial size was normal in size. Pericardium: There is no evidence of pericardial effusion. Mitral Valve: The mitral valve is degenerative in appearance. There is moderate thickening of the mitral valve leaflet(s).  Mild to moderate mitral annular calcification. Mild mitral valve regurgitation. No evidence of mitral valve stenosis. Tricuspid Valve: The tricuspid valve is normal in structure. Tricuspid valve regurgitation is trivial. No evidence of tricuspid stenosis. Aortic Valve: The aortic valve is tricuspid. There is moderate calcification of the aortic valve. There is moderate thickening of the aortic valve. Aortic valve regurgitation is mild. Aortic regurgitation PHT measures 552 msec. No aortic stenosis is present. Aortic valve mean gradient measures 9.5 mmHg. Aortic valve peak gradient measures 20.2 mmHg. Aortic valve area, by VTI measures 1.75 cm. Pulmonic Valve: The pulmonic valve was normal in structure. Pulmonic valve regurgitation is trivial. No evidence of pulmonic stenosis. Aorta: The aortic root is normal in size and structure. Venous: The inferior vena cava is normal in size with greater than 50% respiratory variability, suggesting right atrial pressure of 3 mmHg. IAS/Shunts: No atrial level shunt detected by color flow Doppler.  LEFT VENTRICLE PLAX 2D LVIDd:         4.60 cm  Diastology LVIDs:         3.10 cm  LV e' medial:    3.77 cm/s LV PW:         1.00 cm  LV E/e' medial:  24.1 LV IVS:        1.40 cm  LV e' lateral:   5.17 cm/s LVOT diam:     1.90 cm  LV E/e' lateral: 17.6 LV SV:         80 LV SV Index:   42 LVOT Area:  2.84 cm  RIGHT VENTRICLE RV S prime:     10.50 cm/s TAPSE (M-mode): 1.6 cm LEFT ATRIUM             Index       RIGHT ATRIUM           Index LA diam:        3.30 cm 1.76 cm/m  RA Area:     11.70 cm LA Vol (A2C):   40.2 ml 21.41 ml/m RA Volume:   24.40 ml  13.00 ml/m LA Vol (A4C):   45.0 ml 23.97 ml/m LA Biplane Vol: 45.2 ml 24.07 ml/m  AORTIC VALVE AV Area (Vmax):    1.41 cm AV Area (Vmean):   1.24 cm AV Area (VTI):     1.75 cm AV Vmax:           225.00 cm/s AV Vmean:          139.000 cm/s AV VTI:            0.454 m AV Peak Grad:      20.2 mmHg AV Mean Grad:      9.5 mmHg LVOT  Vmax:         112.00 cm/s LVOT Vmean:        61.000 cm/s LVOT VTI:          0.281 m LVOT/AV VTI ratio: 0.62 AI PHT:            552 msec  AORTA Ao Root diam: 3.10 cm MITRAL VALVE MV Area (PHT): 2.29 cm     SHUNTS MV Decel Time: 331 msec     Systemic VTI:  0.28 m MV E velocity: 90.80 cm/s   Systemic Diam: 1.90 cm MV A velocity: 118.00 cm/s MV E/A ratio:  0.77 Armanda Magic MD Electronically signed by Armanda Magic MD Signature Date/Time: 01/02/2021/1:01:55 PM    Final       Discharge Exam: Vitals:   01/03/21 0344 01/03/21 0843  BP: (!) 182/71 (!) 187/66  Pulse: 73 71  Resp: 16 18  Temp: 98.5 F (36.9 C) 97.7 F (36.5 C)  SpO2: 100% 98%   Vitals:   01/02/21 2129 01/02/21 2337 01/03/21 0344 01/03/21 0843  BP: (!) 214/80 (!) 207/81 (!) 182/71 (!) 187/66  Pulse: 76 78 73 71  Resp: Temp: 98.1 F (36.7 C) 98.1 F (36.7 C) 98.5 F (36.9 C) 97.7 F (36.5 C)  TempSrc: Oral Oral Oral   SpO2: 100% 100% 100% 98%    General: Pt is alert, awake, not in acute distress Cardiovascular: RRR, S1/S2 +, no rubs, no gallops Respiratory: CTA bilaterally, no wheezing, no rhonchi Abdominal: Soft, NT, ND, bowel sounds + Extremities: no edema, no cyanosis    The results of significant diagnostics from this hospitalization (including imaging, microbiology, ancillary and laboratory) are listed below for reference.     Microbiology: Recent Results (from the past 240 hour(s))  SARS CORONAVIRUS 2 (TAT 6-24 HRS) Nasopharyngeal Nasopharyngeal Swab     Status: None   Collection Time: 01/01/21 11:46 PM   Specimen: Nasopharyngeal Swab  Result Value Ref Range Status   SARS Coronavirus 2 NEGATIVE NEGATIVE Final    Comment: (NOTE) SARS-CoV-2 target nucleic acids are NOT DETECTED.  The SARS-CoV-2 RNA is generally detectable in upper and lower respiratory specimens during the acute phase of infection. Negative results do not preclude SARS-CoV-2 infection, do not rule out co-infections with other  pathogens, and should not be used as  the sole basis for treatment or other patient management decisions. Negative results must be combined with clinical observations, patient history, and epidemiological information. The expected result is Negative.  Fact Sheet for Patients: HairSlick.no  Fact Sheet for Healthcare Providers: quierodirigir.com  This test is not yet approved or cleared by the [place] FDA and  has been authorized for detection and/or diagnosis of SARS-CoV-2 by FDA under an Emergency Use Authorization (EUA). This EUA will remain  in effect (meaning this test can be used) for the duration of the COVID-19 declaration under Se ction 564(b)(1) of the Act, 21 U.S.C. section 360bbb-3(b)(1), unless the authorization is terminated or revoked sooner.  Performed at [facility], [address] [place], [place] [address]      Labs: BNP (last 3 results) No results for input(s): BNP in the last 8760 hours. Basic Metabolic Panel: Recent Labs  Lab 01/01/21 1307  NA 140  K 4.1  CL 105  CO2 27  GLUCOSE 102*  BUN 11  CREATININE 0.87  CALCIUM 9.9   Liver Function Tests: Recent Labs  Lab 01/01/21 1307  AST 20  ALT 19  ALKPHOS 46  BILITOT 0.8  PROT 7.4  ALBUMIN 4.2   No results for input(s): LIPASE, AMYLASE in the last 168 hours. No results for input(s): AMMONIA in the last 168 hours. CBC: Recent Labs  Lab 01/01/21 1307  WBC 6.2  NEUTROABS 3.3  HGB 12.8  HCT 37.2  MCV 93.5  PLT 240   Cardiac Enzymes: No results for input(s): CKTOTAL, CKMB, CKMBINDEX, TROPONINI in the last 168 hours. BNP: Invalid input(s): POCBNP CBG: Recent Labs  Lab 01/02/21 0716 01/02/21 1253 01/02/21 1607 01/02/21 2139 01/03/21 0626  GLUCAP 91 100* 113* 123* 135*   D-Dimer No results for input(s): DDIMER in the last 72 hours. Hgb A1c Recent Labs    01/02/21 0326  HGBA1C 6.9*   Lipid Profile Recent Labs     01/02/21 0326  CHOL 94  HDL 36*  LDLCALC 20  TRIG 469*  CHOLHDL 2.6   Thyroid function studies No results for input(s): TSH, T4TOTAL, T3FREE, THYROIDAB in the last 72 hours.  Invalid input(s): FREET3 Anemia work up No results for input(s): VITAMINB12, FOLATE, FERRITIN, TIBC, IRON, RETICCTPCT in the last 72 hours. Urinalysis    Component Value Date/Time   COLORURINE YELLOW 09/16/2020 2212   APPEARANCEUR CLEAR 09/16/2020 2212   LABSPEC >1.046 (H) 09/16/2020 2212   PHURINE 6.0 09/16/2020 2212   GLUCOSEU NEGATIVE 09/16/2020 2212   HGBUR NEGATIVE 09/16/2020 2212   BILIRUBINUR NEGATIVE 09/16/2020 2212   KETONESUR NEGATIVE 09/16/2020 2212   PROTEINUR NEGATIVE 09/16/2020 2212   UROBILINOGEN 0.2 08/11/2014 0046   NITRITE NEGATIVE 09/16/2020 2212   LEUKOCYTESUR NEGATIVE 09/16/2020 2212   Sepsis Labs Invalid input(s): PROCALCITONIN,  WBC,  LACTICIDVEN Microbiology Recent Results (from the past 240 hour(s))  SARS CORONAVIRUS 2 (TAT 6-24 HRS) Nasopharyngeal Nasopharyngeal Swab     Status: None   Collection Time: 01/01/21 11:46 PM   Specimen: Nasopharyngeal Swab  Result Value Ref Range Status   SARS Coronavirus 2 NEGATIVE NEGATIVE Final    Comment: (NOTE) SARS-CoV-2 target nucleic acids are NOT DETECTED.  The SARS-CoV-2 RNA is generally detectable in upper and lower respiratory specimens during the acute phase of infection. Negative results do not preclude SARS-CoV-2 infection, do not rule out co-infections with other pathogens, and should not be used as the sole basis for treatment or other patient management decisions. Negative results must be combined  with clinical observations, patient history, and epidemiological information. The expected result is Negative.  Fact Sheet for Patients: HairSlick.no  Fact Sheet for Healthcare Providers: quierodirigir.com  This test is not yet approved or cleared by the [place]  FDA and  has been authorized for detection and/or diagnosis of SARS-CoV-2 by FDA under an Emergency Use Authorization (EUA). This EUA will remain  in effect (meaning this test can be used) for the duration of the COVID-19 declaration under Se ction 564(b)(1) of the Act, 21 U.S.C. section 360bbb-3(b)(1), unless the authorization is terminated or revoked sooner.  Performed at [facility], [address], [place], [place] [address]      Time coordinating discharge: 35 minutes  SIGNED:   Erick Blinks, DO [facility] 01/03/2021, 9:12 AM  If 7PM-7AM, please contact night-coverage [email or web address]

## 2021-01-03 NOTE — Evaluation (Signed)
Occupational Therapy Evaluation Patient Details Name: Lindsey Leon MRN: [id number] DOB: [birth year] Today's Date: 01/03/2021    History of Present Illness Pt is a 65 y.o. female who presented 3/19 with acute onset double vision in her L eye. 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 Head CT negative for acute findings. Stable CTA of the head and neck as compared to 09/16/2020. MRI of head revealed 6 mm acute to subacute ischemic infarct involving the L basal ganglia/corona radiata with probable additional punctate acute to subacute ischemic infarct involving the left dorsal pons and multiple remote lacunar infarcts involving the bilateral basal ganglia, thalami, and pons. PMH: CAD status post CABG, HOCM, well-controlled HIV, chronic hep C, hypertension, pancreatitis, insulin-dependent diabetes, stroke.   Clinical Impression   PTA patient reports living at [facility] and completing ADLs with modified independence.  She was admitted for above and is limited by problem list below, including weakness, impaired balance, decreased activity tolerance, impaired cognition and diplopia intermittently.  Patient currently requires min assist to min guard for transfers and in room mobility using RW, up to min assist for ADLs.  She has 1 episode of diplopia during session in far gaze but recovers quickly, she reports diplopia is getting better and comes/goes randomly.  Plan to dc back to SNF today, and believe she will benefit from further OT services at SNF to optimize independence and return to PLOF.  Will defer services as plan to dc today.     Follow Up Recommendations  SNF;Supervision/Assistance - 24 hour    Equipment Recommendations  None recommended by OT    Recommendations for Other Services       Precautions / Restrictions Precautions Precautions: Fall Restrictions Weight Bearing Restrictions: No      Mobility Bed  Mobility Overal bed mobility: Modified Independent             General bed mobility comments: Pt using rails and bed controls to come to sit EOB with extra time, reports this is her baseline.    Transfers Overall transfer level: Needs assistance Equipment used: Rolling walker (2 wheeled) Transfers: Sit to/from Stand Sit to Stand: Min assist;Min guard         General transfer comment: min assist to min guard to power up from EOB with cueing for hand placement and safety, increased time and min assist when fatigued    Balance Overall balance assessment: Needs assistance Sitting-balance support: No upper extremity supported;Feet supported Sitting balance-Leahy Scale: Good     Standing balance support: No upper extremity supported;During functional activity;Bilateral upper extremity supported Standing balance-Leahy Scale: Poor Standing balance comment: relies on BUE support dynamically but able to engage in ADLs without UE support given min guard                           ADL either performed or assessed with clinical judgement   ADL Overall ADL's : Needs assistance/impaired     Grooming: Min guard   Upper Body Bathing: Set up;Sitting   Lower Body Bathing: Minimal assistance;Sit to/from stand   Upper Body Dressing : Set up;Sitting   Lower Body Dressing: Minimal assistance;Sit to/from stand   Toilet Transfer: Minimal assistance;Ambulation;RW Toilet Transfer Details (indicate cue type and reason): simulated to recliner Toileting- Clothing Manipulation and Hygiene: Minimal assistance;Sit to/from stand       Functional mobility during ADLs: Minimal assistance;Rolling walker;Cueing for safety General ADL  Comments: pt limited by weakness, impaired balance, cognition     Vision Baseline Vision/History: Wears glasses Wears Glasses: Reading only Patient Visual Report: Diplopia (intermittent) Vision Assessment?: Yes Eye Alignment: Within Functional  Limits Ocular Range of Motion: Within Functional Limits Alignment/Gaze Preference: Within Defined Limits Tracking/Visual Pursuits: Able to track stimulus in all quads without difficulty Saccades: Within functional limits Visual Fields: No apparent deficits Diplopia Assessment: Present in far gaze (intermittently, not consistent) Additional Comments: patient denies diplopia initally, reports it comes on about 10 min in to session but resolves quickly     Perception     Praxis      Pertinent Vitals/Pain Pain Assessment: No/denies pain     Hand Dominance Right   Extremity/Trunk Assessment Upper Extremity Assessment Upper Extremity Assessment: Generalized weakness (decreased FMC bilaterally)   Lower Extremity Assessment Lower Extremity Assessment: Defer to PT evaluation       Communication Communication Communication: Expressive difficulties   Cognition Arousal/Alertness: Awake/alert Behavior During Therapy: WFL for tasks assessed/performed Overall Cognitive Status: History of cognitive impairments - at baseline                                 General Comments: patient oriented and following commands, requires increased time to complete tasks with decreased awareness, problem sovling and memory but noted some baseline deficits as reports difficutly with med mgmt/recall PTA living in SNF for 2 years   General Comments       Exercises     Shoulder Instructions      Home Living Family/patient expects to be discharged to:: Skilled nursing facility   Available Help at Discharge: Skilled Nursing Facility;Available 24 hours/day Type of Home: Skilled Nursing Facility                           Additional Comments: Facility has various types of equipment available to pts. Pt has a rollator and hospital bed that she uses there.      Prior Functioning/Environment Level of Independence: Needs assistance  Gait / Transfers Assistance Needed: Pt reports  being mod I with all functional mobility using a rollator. ADL's / Homemaking Assistance Needed: Pt reports being independent with bathing self at sink, no set-up needed from staff. Staff cook and clean.            OT Problem List: Decreased strength;Decreased activity tolerance;Impaired balance (sitting and/or standing);Impaired vision/perception;Decreased safety awareness;Decreased cognition;Decreased knowledge of precautions;Decreased knowledge of use of DME or AE      OT Treatment/Interventions:      OT Goals(Current goals can be found in the care plan section) Acute Rehab OT Goals Patient Stated Goal: back to SNF OT Goal Formulation: With patient Time For Goal Achievement: 01/17/21 Potential to Achieve Goals: Good  OT Frequency:     Barriers to D/C:            Co-evaluation              AM-PAC OT "6 Clicks" Daily Activity     Outcome Measure Help from another person eating meals?: None Help from another person taking care of personal grooming?: A Little Help from another person toileting, which includes using toliet, bedpan, or urinal?: A Little Help from another person bathing (including washing, rinsing, drying)?: A Little Help from another person to put on and taking off regular upper body clothing?: A Little Help from another person to  put on and taking off regular lower body clothing?: A Little 6 Click Score: 19   End of Session Equipment Utilized During Treatment: Rolling walker Nurse Communication: Mobility status  Activity Tolerance: Patient tolerated treatment well Patient left: in chair;with call bell/phone within reach;with chair alarm set  OT Visit Diagnosis: Other abnormalities of gait and mobility (R26.89);Muscle weakness (generalized) (M62.81);Other symptoms and signs involving the nervous system (R29.898)                Time: 4259-5638 OT Time Calculation (min): 27 min Charges:  OT General Charges $OT Visit: 1 Visit OT Evaluation $OT Eval  Moderate Complexity: 1 Mod OT Treatments $Self Care/Home Management : 8-22 mins  Barry Brunner, OT Acute Rehabilitation Services Pager [phone] Office [phone]   Chancy Milroy 01/03/2021, 10:31 AM

## 2021-01-03 NOTE — NC FL2 (Signed)
[place] MEDICAID FL2 LEVEL OF CARE SCREENING TOOL     IDENTIFICATION  Patient Name: Lindsey Leon Birthdate: [birth year] Sex: female Admission Date (Current Location): 01/01/2021  [facility] and [place] Number:  Producer, television/film/video and Address:  [facility] [place], [address] [place], [place] [address]      Provider Number: [id number]  Attending Physician Name and Address:  Erick Blinks, DO  Relative Name and Phone Number:       Current Level of Care: Hospital Recommended Level of Care: Skilled Nursing Facility Prior Approval Number:    Date Approved/Denied:   PASRR Number:    Discharge Plan: SNF    Current Diagnoses: Patient Active Problem List   Diagnosis Date Noted  . Diplopia 01/01/2021  . 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 HTN (hypertension) 01/01/2021  . Routine screening for STI (sexually transmitted infection) 12/01/2019  . Type 2 diabetes mellitus with hyperglycemia (HCC) 02/2019  . Dementia (HCC) 12/27/2018  . Human immunodeficiency virus (HIV) disease (HCC) 12/25/2018  . Carotid artery occlusion 12/25/2018  . CVA (cerebral vascular accident) (HCC) 12/25/2018  . Chronic hepatitis C without hepatic coma (HCC) 12/2018  . TIA (transient ischemic attack) 12/06/2018  . ICH (intracerebral hemorrhage) (HCC) 05/23/2014  . Diverticulitis of colon 02/24/2013  . Pancreatitis, Hx of 02/24/2013  . Alcohol abuse 02/24/2013    Orientation RESPIRATION BLADDER Height & Weight     Self,Time,Situation,Place  Normal Incontinent Weight:   Height:     BEHAVIORAL SYMPTOMS/MOOD NEUROLOGICAL BOWEL NUTRITION STATUS      Continent Diet (see DC summary)  AMBULATORY STATUS COMMUNICATION OF NEEDS Skin   Limited Assist Verbally Normal                       Personal Care Assistance Level of Assistance  Bathing,Feeding,Dressing Bathing Assistance: Limited assistance Feeding assistance: Limited assistance Dressing Assistance: Limited  assistance     Functional Limitations Info             SPECIAL CARE FACTORS FREQUENCY  PT (By licensed PT),OT (By licensed OT)     PT Frequency: 5x/wk OT Frequency: 5x/wk            Contractures Contractures Info: Not present    Additional Factors Info  Code Status,Allergies Code Status Info: full Allergies Info: NKA           Current Medications (01/03/2021):  This is the current hospital active medication list Current Facility-Administered Medications  Medication Dose Route Frequency Provider Last Rate Last Admin  . acetaminophen (TYLENOL) tablet 650 mg  650 mg Oral Q4H PRN John Giovanni, MD       Or  . acetaminophen (TYLENOL) 160 MG/5ML solution 650 mg  650 mg Per Tube Q4H PRN John Giovanni, MD       Or  . acetaminophen (TYLENOL) suppository 650 mg  650 mg Rectal Q4H PRN John Giovanni, MD      . aspirin EC tablet 81 mg  81 mg Oral Daily Marvel Plan, MD      . bictegravir-emtricitabine-tenofovir AF (BIKTARVY) 50-200-25 MG per tablet 1 tablet  1 tablet Oral Daily Marzetta Board, MD   1 tablet at 01/02/21 1614  . enoxaparin (LOVENOX) injection 40 mg  40 mg Subcutaneous QHS John Giovanni, MD   40 mg at 01/02/21 2131  . gabapentin (NEURONTIN) capsule 900 mg  900 mg Oral QHS John Giovanni, MD   900 mg at 01/02/21 2131  .  insulin aspart (novoLOG) injection 0-15 Units  0-15 Units Subcutaneous TID WC John Giovanni, MD      . insulin aspart (novoLOG) injection 0-5 Units  0-5 Units Subcutaneous QHS John Giovanni, MD      . insulin glargine (LANTUS) injection 13 Units  13 Units Subcutaneous QHS John Giovanni, MD   13 Units at 01/02/21 2131  . sodium chloride flush (NS) 0.9 % injection 3 mL  3 mL Intravenous Once Arby Barrette, MD      . ticagrelor [facility]) tablet 90 mg  90 mg Oral BID Marzetta Board, MD   90 mg at 01/02/21 2130     Discharge Medications: Please see discharge summary for a list of discharge  medications.  Relevant Imaging Results:  Relevant Lab Results:   Additional Information SS#: [id number]  Baldemar Lenis, LCSW

## 2021-01-13 ENCOUNTER — Emergency Department (HOSPITAL_COMMUNITY): Payer: Medicaid Other

## 2021-01-13 ENCOUNTER — Emergency Department (HOSPITAL_COMMUNITY)
Admission: EM | Admit: 2021-01-13 | Discharge: 2021-01-14 | Disposition: A | Discharge status: Home / Self Care | Payer: Medicaid Other | Attending: Emergency Medicine | Admitting: Emergency Medicine

## 2021-01-13 ENCOUNTER — Other Ambulatory Visit: Payer: Self-pay

## 2021-01-13 ENCOUNTER — Encounter (HOSPITAL_COMMUNITY): Payer: Self-pay

## 2021-01-13 DIAGNOSIS — Z87891 Personal history of nicotine dependence: Secondary | ICD-10-CM | POA: Insufficient documentation

## 2021-01-13 DIAGNOSIS — Z21 Asymptomatic human immunodeficiency virus [HIV] infection status: Secondary | ICD-10-CM | POA: Insufficient documentation

## 2021-01-13 DIAGNOSIS — Z79899 Other long term (current) drug therapy: Secondary | ICD-10-CM | POA: Diagnosis not present

## 2021-01-13 DIAGNOSIS — Z7982 Long term (current) use of aspirin: Secondary | ICD-10-CM | POA: Diagnosis not present

## 2021-01-13 DIAGNOSIS — Z794 Long term (current) use of insulin: Secondary | ICD-10-CM | POA: Diagnosis not present

## 2021-01-13 DIAGNOSIS — E119 Type 2 diabetes mellitus without complications: Secondary | ICD-10-CM | POA: Insufficient documentation

## 2021-01-13 DIAGNOSIS — F0390 Unspecified dementia without behavioral disturbance: Secondary | ICD-10-CM | POA: Diagnosis not present

## 2021-01-13 DIAGNOSIS — I10 Essential (primary) hypertension: Secondary | ICD-10-CM | POA: Insufficient documentation

## 2021-01-13 DIAGNOSIS — Z7902 Long term (current) use of antithrombotics/antiplatelets: Secondary | ICD-10-CM | POA: Insufficient documentation

## 2021-01-13 DIAGNOSIS — R4182 Altered mental status, unspecified: Secondary | ICD-10-CM | POA: Insufficient documentation

## 2021-01-13 DIAGNOSIS — R319 Hematuria, unspecified: Secondary | ICD-10-CM

## 2021-01-13 HISTORY — DX: Unspecified dementia, unspecified severity, without behavioral disturbance, psychotic disturbance, mood disturbance, and anxiety: F03.90

## 2021-01-13 NOTE — ED Triage Notes (Signed)
Brought via [facility] from [facility] for [facility] and just not acting normal. Last known well time 11am. Not answering questions appropriately, urinated on self, unable to follow 2 step commands, just not acting normal today per staff

## 2021-01-13 NOTE — ED Provider Notes (Signed)
[facility] EMERGENCY DEPARTMENT Provider Note   CSN: [id number] Arrival date & time: 01/13/21  2319     History Chief Complaint  Patient presents with  . Altered Mental Status    Difficulty following commands and ALOC    Lindsey Leon is a 65 y.o. female.  Patient with hx dementia, cva, presents with 'not acting right' from [facility] since this AM. Patient limited historian, answers some questions but at other times appears confused, with trouble expressing thoughts - level 5 caveat. Patient denies trauma or fall. No loc. Denies headache. No fever/chills. No chest pain or sob. No cough or uri symptoms. No abd pain or nvd. No gu c/o. Pt unaware of change in meds. Pt denies change in speech or vision. Denies any focal or one-sided numbness or weakness.   The history is provided by the patient and the EMS personnel. The history is limited by the condition of the patient.  Altered Mental Status Presenting symptoms: confusion   Associated symptoms: no abdominal pain, no fever, no headaches, no rash, no vomiting and no weakness        Past Medical History:  Diagnosis Date  . Chronic hepatitis C (HCC)    dx'ed in 12/2018  . Diverticulitis   . HIV infection (HCC)    dx'ed in 12/2018  . Hypertension    not on medications  . Pancreatitis   . Stroke [facility])     Patient Active Problem List   Diagnosis Date Noted  . Diplopia 01/01/2021  . 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 HTN (hypertension) 01/01/2021  . Routine screening for STI (sexually transmitted infection) 12/01/2019  . Type 2 diabetes mellitus with hyperglycemia (HCC) 02/2019  . Dementia (HCC) 12/27/2018  . Human immunodeficiency virus (HIV) disease (HCC) 12/25/2018  . Carotid artery occlusion 12/25/2018  . CVA (cerebral vascular accident) (HCC) 12/25/2018  . Chronic hepatitis C without hepatic coma (HCC) 12/2018  . TIA (transient ischemic attack) 12/06/2018  . ICH (intracerebral hemorrhage) (HCC)  05/23/2014  . Diverticulitis of colon 02/24/2013  . Pancreatitis, Hx of 02/24/2013  . Alcohol abuse 02/24/2013    Past Surgical History:  Procedure Laterality Date  . FLEXIBLE SIGMOIDOSCOPY N/A 02/26/2013   Procedure: FLEXIBLE SIGMOIDOSCOPY;  Surgeon: Theda Belfast, MD;  Location: [facility] ENDOSCOPY;  Service: Endoscopy;  Laterality: N/A;     OB History   No obstetric history on file.     Family History  Problem Relation Age of Onset  . Healthy Father   . Cancer Mother        passed away. unknown age  . Hypertension Sister   . Diabetes Mellitus II Sister     Social History   Tobacco Use  . Smoking status: Former Smoker    Packs/day: 0.50    Years: 30.00    Pack years: 15.00    Types: Cigarettes    Quit date: 05/22/2015    Years since quitting: 5.6  . Smokeless tobacco: Never Used  Vaping Use  . Vaping Use: Never used  Substance Use Topics  . Alcohol use: No    Alcohol/week: 0.0 standard drinks  . Drug use: Not Currently    Types: Marijuana    Home Medications Prior to Admission medications   Medication Sig Start Date End Date Taking? Authorizing Provider  acetaminophen (TYLENOL) 325 MG tablet Take 650 mg by mouth 2 (two) times daily as needed for moderate pain or mild pain.    [provider]  aspirin EC 81 MG EC tablet Take 1 tablet (81 mg total) by mouth daily. Swallow whole. 01/03/21 02/02/21  Sherryll Burger, Pratik D, DO  B Complex Vitamins (VITAMIN B COMPLEX) TABS Take 1 tablet by mouth daily with breakfast.    [provider]  BIKTARVY 50-200-25 MG TABS tablet TAKE 1 TABLET BY MOUTH DAILY. 07/30/20   Comer, Belia Heman, MD  Calcium Carbonate Antacid (TUMS ULTRA 1000 PO) Take 2 tablets by mouth daily as needed (indigestion).    [provider]  Cholecalciferol (VITAMIN D) 50 MCG (2000 UT) tablet Take 2,000 Units by mouth daily.    [provider]  clopidogrel (PLAVIX) 75 MG tablet Take 1 tablet (75 mg total) by mouth daily. 02/03/21   Sherryll Burger,  Pratik D, DO  Famotidine (PEPCID PO) Take 1 tablet by mouth daily.    [provider]  ferrous gluconate (FERGON) 324 MG tablet Take 324 mg by mouth daily with breakfast.    [provider]  gabapentin (NEURONTIN) 100 MG capsule Take 100 mg by mouth See admin instructions. Take 1 capsule by mouth twice daily at 4PM and bedtime. Take bedtime dose with a 800mg  capsule for a total dose of 900mg  at bedtime.    [provider]  gabapentin (NEURONTIN) 300 MG capsule Take 300 mg by mouth daily.    [provider]  gabapentin (NEURONTIN) 800 MG tablet Take 800 mg by mouth at bedtime. Take with a 100mg  capsule to equal 100mg  at bedtime.    [provider]  insulin glargine (LANTUS) 100 UNIT/ML injection Inject 13 Units into the skin at bedtime.    [provider]  losartan (COZAAR) 50 MG tablet Take 75 mg by mouth daily at 12 noon.    [provider]  metoprolol succinate (TOPROL-XL) 50 MG 24 hr tablet Take 50 mg by mouth daily. Take with or immediately following a meal.    [provider]  polyethylene glycol powder (GLYCOLAX/MIRALAX) 17 GM/SCOOP powder Take 17 g by mouth daily as needed for mild constipation or moderate constipation. Mix in 4-6 oz of liquid and take by mouth    [provider]  sennosides-docusate sodium (SENOKOT-S) 8.6-50 MG tablet Take 2 tablets by mouth at bedtime.    [provider]  ticagrelor (BRILINTA) 90 MG TABS tablet Take 1 tablet (90 mg total) by mouth 2 (two) times daily. 01/03/21 02/02/21  D, DO    Allergies    Patient has no known allergies.  Review of Systems   Review of Systems  Constitutional: Negative for chills and fever.  HENT: Negative for sore throat.   Eyes: Negative for redness.  Respiratory: Negative for cough and shortness of breath.   Cardiovascular: Negative for chest pain.  Gastrointestinal: Negative for abdominal pain, diarrhea and vomiting.   Genitourinary: Negative for dysuria and flank pain.  Musculoskeletal: Negative for back pain and neck pain.  Skin: Negative for rash.  Neurological: Negative for weakness, numbness and headaches.  Hematological: Does not bruise/bleed easily.  Psychiatric/Behavioral: Positive for confusion.    Physical Exam Updated Vital Signs There were no vitals taken for this visit.  Physical Exam Vitals and nursing note reviewed.  Constitutional:      Appearance: Normal appearance. She is well-developed.  HENT:     Head: Atraumatic.     Nose: Nose normal.     Mouth/Throat:     Mouth: Mucous membranes are moist.  Eyes:     General: No scleral icterus.  Conjunctiva/sclera: Conjunctivae normal.     Pupils: Pupils are equal, round, and reactive to light.  Neck:     Vascular: No carotid bruit.     Trachea: No tracheal deviation.     Comments: No stiffness or rigidity. Cardiovascular:     Rate and Rhythm: Normal rate and regular rhythm.     Pulses: Normal pulses.     Heart sounds: Normal heart sounds. No murmur heard. No friction rub. No gallop.   Pulmonary:     Effort: Pulmonary effort is normal. No respiratory distress.     Breath sounds: Normal breath sounds.  Abdominal:     General: Bowel sounds are normal. There is no distension.     Palpations: Abdomen is soft.     Tenderness: There is no abdominal tenderness. There is no guarding.  Genitourinary:    Comments: No cva tenderness.  Musculoskeletal:        General: No swelling.     Cervical back: Normal range of motion and neck supple. No rigidity. No muscular tenderness.     Right lower leg: No edema.     Left lower leg: No edema.  Skin:    General: Skin is warm and dry.     Findings: No rash.  Neurological:     Mental Status: She is alert.     Comments: Alert, speech not grossly dysarthric. At times difficulty answering questions ?expressive aphasia. Motor intact bil, stre 5/5. Sens grossly intact.   Psychiatric:         Mood and Affect: Mood normal.     ED Results / Procedures / Treatments   Labs (all labs ordered are listed, but only abnormal results are displayed) Results for orders placed or performed during the hospital encounter of 01/13/21  CBC  Result Value Ref Range   WBC 7.0 4.0 - 10.5 K/uL   RBC 4.17 3.87 - 5.11 MIL/uL   Hemoglobin 13.3 12.0 - 15.0 g/dL   HCT 78.2 95.6 - 21.3 %   MCV 95.9 80.0 - 100.0 fL   MCH 31.9 26.0 - 34.0 pg   MCHC 33.3 30.0 - 36.0 g/dL   RDW 08.6 57.8 - 46.9 %   Platelets 282 150 - 400 K/uL   nRBC 0.0 0.0 - 0.2 %  Comprehensive metabolic panel  Result Value Ref Range   Sodium 139 135 - 145 mmol/L   Potassium 3.5 3.5 - 5.1 mmol/L   Chloride 107 98 - 111 mmol/L   CO2 21 (L) 22 - 32 mmol/L   Glucose, Bld 107 (H) 70 - 99 mg/dL   BUN 18 8 - 23 mg/dL   Creatinine, Ser 6.29 (H) 0.44 - 1.00 mg/dL   Calcium 9.8 8.9 - 52.8 mg/dL   Total Protein 7.6 6.5 - 8.1 g/dL   Albumin 4.3 3.5 - 5.0 g/dL   AST 21 15 - 41 U/L   ALT 21 0 - 44 U/L   Alkaline Phosphatase 57 38 - 126 U/L   Total Bilirubin 0.7 0.3 - 1.2 mg/dL   GFR, Estimated 57 (L) >60 mL/min   Anion gap 11 5 - 15  Ammonia  Result Value Ref Range   Ammonia 15 9 - 35 umol/L  Troponin I (High Sensitivity)  Result Value Ref Range   Troponin I (High Sensitivity) 18 (H) <18 ng/L   CT Angio Head W or Wo Contrast  Result Date: 01/01/2021 CLINICAL DATA:  Initial evaluation for left-sided blurry vision, stroke. EXAM: CT ANGIOGRAPHY HEAD AND NECK  TECHNIQUE: Multidetector CT imaging of the head and neck was performed using the standard protocol during bolus administration of intravenous contrast. Multiplanar CT image reconstructions and MIPs were obtained to evaluate the vascular anatomy. Carotid stenosis measurements (when applicable) are obtained utilizing NASCET criteria, using the distal internal carotid diameter as the denominator. CONTRAST:  84mL OMNIPAQUE IOHEXOL 350 MG/ML SOLN COMPARISON:  Prior CT from earlier the  same day. FINDINGS: CTA NECK FINDINGS Aortic arch: Visualized aortic arch normal in caliber with normal branch pattern. Moderate atheromatous change about the arch and origin of the great vessels without hemodynamically significant stenosis. Visualized subclavian arteries patent. 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 Distal reconstitution at the right carotid bifurcation via right external carotid artery branches. Heavy calcified plaque at the right bifurcation with associated severe at least 80% stenosis by NASCET criteria. Right ICA somewhat diminutive but patent distally to the skull base. Left carotid system: Scattered eccentric calcified plaque throughout the left carotid artery system without hemodynamically significant stenosis. Vertebral arteries: Both vertebral arteries arise from subclavian arteries. Atheromatous change about the origins of both vertebral arteries with moderate stenosis on the left and more severe proximal stenosis on the right (series 7, image 270). Vertebral arteries otherwise irregular but patent within the neck. Skeleton: No visible acute osseous finding. Osseous structures are somewhat mottled without discrete osseous lesion. Scattered dental caries noted. Other neck: No other acute soft tissue abnormality within the neck. No mass or adenopathy. Upper chest: Visualized upper chest demonstrates no other acute finding. Review of the MIP images confirms the above findings CTA HEAD FINDINGS Anterior circulation: Petrous segments patent bilaterally. Atheromatous change throughout the carotid siphons with associated stenoses at the supraclinoid segments, moderate on the right, more severe on the left. A1 segments patent bilaterally. Normal anterior communicating artery complex. Anterior cerebral arteries patent to their distal aspects without stenosis. No M1 stenosis or occlusion. No proximal MCA branch occlusion. Distal MCA branches well perfused and  fairly symmetric. Posterior circulation: Both V4 segments patent without significant stenosis. Both PICA origins patent and normal. Basilar widely patent. Superior cerebellar arteries patent. Both PCAs primarily supplied via the basilar. Moderate multifocal bilateral P2 stenoses. PCAs remain patent to their distal aspects. Venous sinuses: Patent. Anatomic variants: None significant. Review of the MIP images confirms the above findings IMPRESSION: 1. Stable CTA of the head and neck as compared to 09/16/2020. 2. 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 Additional heavy calcified plaque at the right bifurcation with severe at least 80% stenosis by NASCET criteria. 3. Atheromatous change throughout the carotid siphons with associated bilateral supraclinoid stenoses, moderate on the right and more severe on the left. 4. Atheromatous plaque about the origins of both vertebral arteries, with associated severe stenosis on the right and more moderate narrowing on the left. Electronically Signed   By: Rise Mu M.D.   On: 01/01/2021 21:19   CT HEAD WO CONTRAST  Result Date: 01/01/2021 CLINICAL DATA:  Neuro deficit, acute, stroke suspected. Additional provided: Patient reports blurred vision in left eye since 5 a.m. EXAM: CT HEAD WITHOUT CONTRAST TECHNIQUE: Contiguous axial images were obtained from the base of the skull through the vertex without intravenous contrast. COMPARISON:  Brain MRI 09/16/2020. FINDINGS: Brain: Mild cerebral and cerebellar atrophy. Commensurate prominence of the ventricles and sulci. Redemonstrated chronic lacunar infarcts within the deep gray nuclei bilaterally. Redemonstrated chronic small vessel ischemic disease within the pons, including a chronic  right pontine lacunar infarct. Advanced patchy and ill-defined hypoattenuation within the cerebral white matter is nonspecific, but compatible with chronic small vessel ischemic  disease. There is no acute intracranial hemorrhage. No demarcated cortical infarct. No extra-axial fluid collection. No evidence of intracranial mass. No midline shift. Vascular: No hyperdense vessel.  Atherosclerotic calcifications Skull: Normal. Negative for fracture or focal lesion. Sinuses/Orbits: Visualized orbits show no acute finding. No significant paranasal sinus disease at the imaged levels. IMPRESSION: No evidence of acute intracranial abnormality. Redemonstrated chronic lacunar infarcts within the deep gray nuclei bilaterally. Redemonstrated severe cerebral white matter chronic small vessel ischemic disease. Chronic small vessel ischemic changes also again seen within the a pons, including a chronic right pontine lacunar infarct. Mild generalized parenchymal atrophy. Electronically Signed   By: Jackey Loge DO   On: 01/01/2021 14:54   CT Angio Neck W and/or Wo Contrast  Result Date: 01/01/2021 CLINICAL DATA:  Initial evaluation for left-sided blurry vision, stroke. EXAM: CT ANGIOGRAPHY HEAD AND NECK TECHNIQUE: Multidetector CT imaging of the head and neck was performed using the standard protocol during bolus administration of intravenous contrast. Multiplanar CT image reconstructions and MIPs were obtained to evaluate the vascular anatomy. Carotid stenosis measurements (when applicable) are obtained utilizing NASCET criteria, using the distal internal carotid diameter as the denominator. CONTRAST:  75mL OMNIPAQUE IOHEXOL 350 MG/ML SOLN COMPARISON:  Prior CT from earlier the same day. FINDINGS: CTA NECK FINDINGS Aortic arch: Visualized aortic arch normal in caliber with normal branch pattern. Moderate atheromatous change about the arch and origin of the great vessels without hemodynamically significant stenosis. Visualized subclavian arteries patent. 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 Distal reconstitution at the right carotid bifurcation via right external  carotid artery branches. Heavy calcified plaque at the right bifurcation with associated severe at least 80% stenosis by NASCET criteria. Right ICA somewhat diminutive but patent distally to the skull base. Left carotid system: Scattered eccentric calcified plaque throughout the left carotid artery system without hemodynamically significant stenosis. Vertebral arteries: Both vertebral arteries arise from subclavian arteries. Atheromatous change about the origins of both vertebral arteries with moderate stenosis on the left and more severe proximal stenosis on the right (series 7, image 270). Vertebral arteries otherwise irregular but patent within the neck. Skeleton: No visible acute osseous finding. Osseous structures are somewhat mottled without discrete osseous lesion. Scattered dental caries noted. Other neck: No other acute soft tissue abnormality within the neck. No mass or adenopathy. Upper chest: Visualized upper chest demonstrates no other acute finding. Review of the MIP images confirms the above findings CTA HEAD FINDINGS Anterior circulation: Petrous segments patent bilaterally. Atheromatous change throughout the carotid siphons with associated stenoses at the supraclinoid segments, moderate on the right, more severe on the left. A1 segments patent bilaterally. Normal anterior communicating artery complex. Anterior cerebral arteries patent to their distal aspects without stenosis. No M1 stenosis or occlusion. No proximal MCA branch occlusion. Distal MCA branches well perfused and fairly symmetric. Posterior circulation: Both V4 segments patent without significant stenosis. Both PICA origins patent and normal. Basilar widely patent. Superior cerebellar arteries patent. Both PCAs primarily supplied via the basilar. Moderate multifocal bilateral P2 stenoses. PCAs remain patent to their distal aspects. Venous sinuses: Patent. Anatomic variants: None significant. Review of the MIP images confirms the above  findings IMPRESSION: 1. Stable CTA of the head and neck as compared to 09/16/2020. 2. 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  Additional heavy calcified plaque at the right bifurcation with severe at least 80% stenosis by NASCET criteria. 3. Atheromatous change throughout the carotid siphons with associated bilateral supraclinoid stenoses, moderate on the right and more severe on the left. 4. Atheromatous plaque about the origins of both vertebral arteries, with associated severe stenosis on the right and more moderate narrowing on the left. Electronically Signed   By: Rise Mu M.D.   On: 01/01/2021 21:19   MR BRAIN W WO CONTRAST  Result Date: 01/02/2021 CLINICAL DATA:  Initial evaluation for neuro deficit, stroke suspected. EXAM: MRI HEAD WITHOUT AND WITH CONTRAST TECHNIQUE: Multiplanar, multiecho pulse sequences of the brain and surrounding structures were obtained without and with intravenous contrast. CONTRAST:  7.90mL GADAVIST GADOBUTROL 1 MMOL/ML IV SOLN COMPARISON:  Previous CT from 01/01/2021 as well as previous brain MRI from 09/16/2020 FINDINGS: Brain: Examination degraded by motion artifact. Generalized age-related cerebral atrophy. Extensive patchy and confluent T2/FLAIR hyperintensity within the periventricular and deep white matter both cerebral hemispheres most consistent with chronic small vessel ischemic disease, advanced in nature. Multiple remote lacunar infarcts present about the bilateral basal ganglia, thalami, and pons. Chronic hemosiderin staining again noted at the posterior left temporal region. Additional chronic microhemorrhage noted at the right occipital pole. 6 mm focus of diffusion abnormality seen involving the left basal ganglia/corona radiata, consistent with an acute to subacute ischemic infarct. Associated patchy post-contrast enhancement consistent with subacute ischemia. No associated hemorrhage or mass  effect. There is an additional suspected subtle punctate ischemic infarct involving the left dorsal pons (series 8, image 49). No associated hemorrhage or mass effect. No other evidence for acute or subacute ischemia. Gray-white matter differentiation otherwise maintained. No other areas of remote cortical infarction. No other evidence for acute or chronic intracranial hemorrhage. No mass lesion, midline shift or mass effect. Diffuse ventricular prominence related to global parenchymal volume loss without hydrocephalus. No extra-axial fluid collection. Pituitary gland suprasellar region within normal limits. Midline structures intact. No other abnormal enhancement. Vascular: Major intracranial vascular flow voids are preserved. Skull and upper cervical spine: Craniocervical junction within normal limits. Bone marrow signal intensity normal. No focal marrow replacing lesion. No scalp soft tissue abnormality. Sinuses/Orbits: Globes and orbital soft tissues within normal limits. Paranasal sinuses are largely clear. No significant mastoid effusion. Inner ear structures grossly normal. Other: None. IMPRESSION: 1. 6 mm acute to subacute ischemic infarct involving the left basal ganglia/corona radiata. No associated hemorrhage or mass effect. 2. Probable additional punctate acute to subacute ischemic infarct ischemic infarct involving the left dorsal pons. 3. Underlying age-related cerebral atrophy with advanced chronic microvascular ischemic disease with multiple remote lacunar infarcts involving the bilateral basal ganglia, thalami, and pons. Electronically Signed   By: Rise Mu M.D.   On: 01/02/2021 02:07   ECHOCARDIOGRAM COMPLETE  Result Date: 01/02/2021    ECHOCARDIOGRAM REPORT   Patient Name:   Lindsey Leon Date of Exam: 01/02/2021 Medical Rec #:  [id number]        Height:       66.0 in Accession #:    [id number]       Weight:       172.4 lb Date of Birth:  [birth year]        BSA:          1.877 m  Patient Age:    64 years         BP:           193/63 mmHg Patient Gender: F  HR:           72 bpm. Exam Location:  Inpatient Procedure: 2D Echo, Cardiac Doppler and Color Doppler Indications:    Stroke I63.9  History:        Patient has prior history of Echocardiogram examinations.                 Stroke; Risk Factors:Hypertension and Diabetes. HIV, Dementia,                 Chronic hepatitis C (HCC) (From Hx).  Sonographer:    Celesta GentileBernard White RCS Referring Phys: [id number] VASUNDHRA RATHORE IMPRESSIONS  1. Left ventricular ejection fraction, by estimation, is 60 to 65%. The left ventricle has normal function. The left ventricle has no regional wall motion abnormalities. There is moderate asymmetric left ventricular hypertrophy. Left ventricular diastolic parameters are consistent with Grade I diastolic dysfunction (impaired relaxation). Elevated left ventricular end-diastolic pressure.  2. Right ventricular systolic function is normal. The right ventricular size is normal. Tricuspid regurgitation signal is inadequate for assessing PA pressure.  3. The mitral valve is degenerative. Mild mitral valve regurgitation. No evidence of mitral stenosis.  4. The aortic valve is tricuspid. There is moderate calcification of the aortic valve. There is moderate thickening of the aortic valve. Aortic valve regurgitation is mild. No aortic stenosis is present. Aortic regurgitation PHT measures 552 msec. Aortic valve area, by VTI measures 1.75 cm. Aortic valve mean gradient measures 9.5 mmHg. Aortic valve Vmax measures 2.25 m/s.  5. The inferior vena cava is normal in size with greater than 50% respiratory variability, suggesting right atrial pressure of 3 mmHg. FINDINGS  Left Ventricle: Left ventricular ejection fraction, by estimation, is 60 to 65%. The left ventricle has normal function. The left ventricle has no regional wall motion abnormalities. The left ventricular internal cavity size was normal in size. There  is  moderate asymmetric left ventricular hypertrophy. Left ventricular diastolic parameters are consistent with Grade I diastolic dysfunction (impaired relaxation). Elevated left ventricular end-diastolic pressure. Right Ventricle: The right ventricular size is normal. No increase in right ventricular wall thickness. Right ventricular systolic function is normal. Tricuspid regurgitation signal is inadequate for assessing PA pressure. Left Atrium: Left atrial size was normal in size. Right Atrium: Right atrial size was normal in size. Pericardium: There is no evidence of pericardial effusion. Mitral Valve: The mitral valve is degenerative in appearance. There is moderate thickening of the mitral valve leaflet(s). Mild to moderate mitral annular calcification. Mild mitral valve regurgitation. No evidence of mitral valve stenosis. Tricuspid Valve: The tricuspid valve is normal in structure. Tricuspid valve regurgitation is trivial. No evidence of tricuspid stenosis. Aortic Valve: The aortic valve is tricuspid. There is moderate calcification of the aortic valve. There is moderate thickening of the aortic valve. Aortic valve regurgitation is mild. Aortic regurgitation PHT measures 552 msec. No aortic stenosis is present. Aortic valve mean gradient measures 9.5 mmHg. Aortic valve peak gradient measures 20.2 mmHg. Aortic valve area, by VTI measures 1.75 cm. Pulmonic Valve: The pulmonic valve was normal in structure. Pulmonic valve regurgitation is trivial. No evidence of pulmonic stenosis. Aorta: The aortic root is normal in size and structure. Venous: The inferior vena cava is normal in size with greater than 50% respiratory variability, suggesting right atrial pressure of 3 mmHg. IAS/Shunts: No atrial level shunt detected by color flow Doppler.  LEFT VENTRICLE PLAX 2D LVIDd:         4.60 cm  Diastology LVIDs:  3.10 cm  LV e' medial:    3.77 cm/s LV PW:         1.00 cm  LV E/e' medial:  24.1 LV IVS:        1.40  cm  LV e' lateral:   5.17 cm/s LVOT diam:     1.90 cm  LV E/e' lateral: 17.6 LV SV:         80 LV SV Index:   42 LVOT Area:     2.84 cm  RIGHT VENTRICLE RV S prime:     10.50 cm/s TAPSE (M-mode): 1.6 cm LEFT ATRIUM             Index       RIGHT ATRIUM           Index LA diam:        3.30 cm 1.76 cm/m  RA Area:     11.70 cm LA Vol (A2C):   40.2 ml 21.41 ml/m RA Volume:   24.40 ml  13.00 ml/m LA Vol (A4C):   45.0 ml 23.97 ml/m LA Biplane Vol: 45.2 ml 24.07 ml/m  AORTIC VALVE AV Area (Vmax):    1.41 cm AV Area (Vmean):   1.24 cm AV Area (VTI):     1.75 cm AV Vmax:           225.00 cm/s AV Vmean:          139.000 cm/s AV VTI:            0.454 m AV Peak Grad:      20.2 mmHg AV Mean Grad:      9.5 mmHg LVOT Vmax:         112.00 cm/s LVOT Vmean:        61.000 cm/s LVOT VTI:          0.281 m LVOT/AV VTI ratio: 0.62 AI PHT:            552 msec  AORTA Ao Root diam: 3.10 cm MITRAL VALVE MV Area (PHT): 2.29 cm     SHUNTS MV Decel Time: 331 msec     Systemic VTI:  0.28 m MV E velocity: 90.80 cm/s   Systemic Diam: 1.90 cm MV A velocity: 118.00 cm/s MV E/A ratio:  0.77 Armanda Magic MD Electronically signed by Armanda Magic MD Signature Date/Time: 01/02/2021/1:01:55 PM    Final     EKG EKG Interpretation  Date/Time:  Thursday January 13 2021 23:26:18 EDT Ventricular Rate:  80 PR Interval:  189 QRS Duration: 96 QT Interval:  414 QTC Calculation: 478 R Axis:   -38 Text Interpretation: Sinus rhythm Left ventricular hypertrophy Nonspecific T wave abnormality Confirmed by Cathren Laine ([id number]) on 01/13/2021 11:32:01 PM   Radiology CT HEAD WO CONTRAST  Result Date: 01/14/2021 CLINICAL DATA:  Altered mental status EXAM: CT HEAD WITHOUT CONTRAST TECHNIQUE: Contiguous axial images were obtained from the base of the skull through the vertex without intravenous contrast. COMPARISON:  None. FINDINGS: Brain: Normal anatomic configuration. Parenchymal volume loss is commensurate with the patient's age. Extensive  subcortical and periventricular white matter changes are present likely reflecting the sequela of small vessel ischemia. Remote infarcts are noted involving the thalami bilaterally, right basal ganglia, right pons. Benign calcifications are seen within the globus pallidum bilaterally. No abnormal intra or extra-axial mass lesion or fluid collection. No abnormal mass effect or midline shift. No evidence of acute intracranial hemorrhage or infarct. Ventricular size is normal given the degree of cerebral atrophy. Cerebellum  unremarkable. Vascular: No asymmetric hyperdense vasculature at the skull base. Skull: Intact Sinuses/Orbits: Paranasal sinuses are clear. Orbits are unremarkable. Other: Mastoid air cells and middle ear cavities are clear. IMPRESSION: No evidence of acute intracranial hemorrhage or infarct. Advanced senescent changes. Multiple remote infarcts. Electronically Signed   By: Helyn Numbers MD   On: 01/14/2021 00:45   DG Chest Port 1 View  Result Date: 01/14/2021 CLINICAL DATA:  Pain. EXAM: PORTABLE CHEST 1 VIEW COMPARISON:  05/24/2014 FINDINGS: Lung volumes are low. Post median sternotomy. Heart is normal in size. Aortic atherosclerosis. Minimal streaky retrocardiac opacity. No confluent consolidation. No pulmonary edema, large pleural effusion or pneumothorax. No acute osseous abnormalities are seen. IMPRESSION: Low lung volumes with minimal streaky retrocardiac opacity, favor atelectasis. Electronically Signed   By: Narda Rutherford M.D.   On: 01/14/2021 00:00    Procedures Procedures   Medications Ordered in ED Medications - No data to display  ED Course  I have reviewed the triage vital signs and the nursing notes.  Pertinent labs & imaging results that were available during my care of the patient were reviewed by me and considered in my medical decision making (see chart for details).    MDM Rules/Calculators/A&P                         Iv ns. Continuous pulse ox and cardiac  monitoring. Stat labs and imaging.   Reviewed nursing notes and prior charts for additional history.   CT reviewed/intepreted by me - no hem.  CXR reviewed/interpreted by me -  No pna.   Labs reviewed/interpreted by me - wbc normal. hgb normal. UA pending.   Po fluids.   Recheck, alert, content, no new c/o. Afebrile. Breathing comfortably. UA remains pending.   0700 signed out to oncoming EDP - check UA. If UA pos, treat. If UA neg, feel stable for d/c, would rec holding neurontin and close pcp f/u.       Final Clinical Impression(s) / ED Diagnoses Final diagnoses:  None    Rx / DC Orders ED Discharge Orders    None       Cathren Laine, MD 01/14/21 [phone]

## 2021-01-14 ENCOUNTER — Other Ambulatory Visit (HOSPITAL_COMMUNITY): Payer: Self-pay

## 2021-01-14 ENCOUNTER — Emergency Department (HOSPITAL_COMMUNITY): Payer: Medicaid Other

## 2021-01-14 LAB — COMPREHENSIVE METABOLIC PANEL
ALT: 21 U/L (ref 0–44)
AST: 21 U/L (ref 15–41)
Albumin: 4.3 g/dL (ref 3.5–5.0)
Alkaline Phosphatase: 57 U/L (ref 38–126)
Anion gap: 11 (ref 5–15)
BUN: 18 mg/dL (ref 8–23)
CO2: 21 mmol/L — ABNORMAL LOW (ref 22–32)
Calcium: 9.8 mg/dL (ref 8.9–10.3)
Chloride: 107 mmol/L (ref 98–111)
Creatinine, Ser: 1.08 mg/dL — ABNORMAL HIGH (ref 0.44–1.00)
GFR, Estimated: 57 mL/min — ABNORMAL LOW (ref >60)
Glucose, Bld: 107 mg/dL — ABNORMAL HIGH (ref 70–99)
Potassium: 3.5 mmol/L (ref 3.5–5.1)
Sodium: 139 mmol/L (ref 135–145)
Total Bilirubin: 0.7 mg/dL (ref 0.3–1.2)
Total Protein: 7.6 g/dL (ref 6.5–8.1)

## 2021-01-14 LAB — URINALYSIS, ROUTINE W REFLEX MICROSCOPIC
Bilirubin Urine: NEGATIVE
Glucose, UA: NEGATIVE mg/dL
Ketones, ur: NEGATIVE mg/dL
Nitrite: NEGATIVE
Protein, ur: 100 mg/dL — AB
Specific Gravity, Urine: 1.027 (ref 1.005–1.030)
pH: 5 (ref 5.0–8.0)

## 2021-01-14 LAB — CBC
HCT: 40 % (ref 36.0–46.0)
Hemoglobin: 13.3 g/dL (ref 12.0–15.0)
MCH: 31.9 pg (ref 26.0–34.0)
MCHC: 33.3 g/dL (ref 30.0–36.0)
MCV: 95.9 fL (ref 80.0–100.0)
Platelets: 282 10*3/uL (ref 150–400)
RBC: 4.17 MIL/uL (ref 3.87–5.11)
RDW: 14.6 % (ref 11.5–15.5)
WBC: 7 10*3/uL (ref 4.0–10.5)
nRBC: 0 % (ref 0.0–0.2)

## 2021-01-14 LAB — AMMONIA: Ammonia: 15 umol/L (ref 9–35)

## 2021-01-14 LAB — TROPONIN I (HIGH SENSITIVITY)
Troponin I (High Sensitivity): 18 ng/L — ABNORMAL HIGH (ref <18)
Troponin I (High Sensitivity): 20 ng/L — ABNORMAL HIGH (ref <18)

## 2021-01-14 MED ORDER — CEPHALEXIN 500 MG PO CAPS: 500.0000 mg | ORAL_CAPSULE | Freq: Four times a day (QID) | ORAL | 0 refills | Status: AC

## 2021-01-14 NOTE — ED Provider Notes (Signed)
Blood pressure (!) 162/78, pulse 73, temperature 97.9 F (36.6 C), temperature source Oral, resp. rate (!) 24, height 5\' 6"  (1.676 m), weight 70.8 kg, SpO2 96 %.  Assuming care from Dr. .  In short, Lindsey Leon is a 65 y.o. female with a chief complaint of Altered Mental Status (Difficulty following commands and ALOC) .  Refer to the original H&P for additional details.  The current plan of care is to follow up on UA and reassess.  10:12 AM  UA resulted.  The color of the urine at bedside is fairly turbid.  No gross hematuria.  There is some hemoglobin and rare bacteria with small leukocyte.  Plan to cover with Keflex but will send the urine for culture.  Patient is stable for discharge back to her facility for monitoring and continued treatment.     77, MD 01/14/21 1013

## 2021-01-14 NOTE — ED Notes (Signed)
Secretary assisting to call [facility] for transport back to [facility].

## 2021-01-14 NOTE — ED Notes (Signed)
Radiology at bedside

## 2021-01-14 NOTE — ED Notes (Signed)
Pt's AVS & Medical Necessity has been printed, report was called to facility, transport was called, no known ETA at this time.

## 2021-01-14 NOTE — Discharge Instructions (Addendum)
It was our pleasure to provide your ER care today - we hope that you feel better.  Follow up with your doctor in the coming week.  Hold your neurontin medication as sometimes it can affect your mental status and/or cause confusion.   I am starting an antibiotic for a possible urine infection. We will call in a few days if we need to change the antibiotic.   Return to ER right away if worse, new symptoms, fevers, new or severe pain, trouble breathing, change in speech or vision, one-sided numbness or weakness, fainting, or other concern.

## 2021-01-14 NOTE — ED Notes (Signed)
ujrine sample taken from [employer].

## 2021-01-15 LAB — URINE CULTURE: Culture: <10000 — AB

## 2021-01-21 ENCOUNTER — Other Ambulatory Visit (HOSPITAL_COMMUNITY): Payer: Self-pay

## 2021-01-21 MED FILL — Bictegravir-Emtricitabine-Tenofovir AF Tab 50-200-25 MG: ORAL | 30 days supply | Qty: 30 | Fill #0 | Status: AC

## 2021-01-24 ENCOUNTER — Other Ambulatory Visit (HOSPITAL_COMMUNITY): Payer: Self-pay

## 2021-02-11 ENCOUNTER — Emergency Department (HOSPITAL_COMMUNITY): Payer: Medicaid Other

## 2021-02-11 ENCOUNTER — Encounter (HOSPITAL_COMMUNITY): Payer: Self-pay

## 2021-02-11 ENCOUNTER — Emergency Department (HOSPITAL_COMMUNITY)
Admission: EM | Admit: 2021-02-11 | Discharge: 2021-02-12 | Disposition: A | Discharge status: Home / Self Care | Payer: Medicaid Other | Attending: Emergency Medicine | Admitting: Emergency Medicine

## 2021-02-11 ENCOUNTER — Other Ambulatory Visit: Payer: Self-pay

## 2021-02-11 DIAGNOSIS — Z7902 Long term (current) use of antithrombotics/antiplatelets: Secondary | ICD-10-CM | POA: Insufficient documentation

## 2021-02-11 DIAGNOSIS — Z794 Long term (current) use of insulin: Secondary | ICD-10-CM | POA: Diagnosis not present

## 2021-02-11 DIAGNOSIS — Z87891 Personal history of nicotine dependence: Secondary | ICD-10-CM | POA: Insufficient documentation

## 2021-02-11 DIAGNOSIS — E119 Type 2 diabetes mellitus without complications: Secondary | ICD-10-CM | POA: Diagnosis not present

## 2021-02-11 DIAGNOSIS — F0390 Unspecified dementia without behavioral disturbance: Secondary | ICD-10-CM | POA: Diagnosis not present

## 2021-02-11 DIAGNOSIS — Z79899 Other long term (current) drug therapy: Secondary | ICD-10-CM | POA: Insufficient documentation

## 2021-02-11 DIAGNOSIS — Z21 Asymptomatic human immunodeficiency virus [HIV] infection status: Secondary | ICD-10-CM | POA: Diagnosis not present

## 2021-02-11 DIAGNOSIS — I10 Essential (primary) hypertension: Secondary | ICD-10-CM | POA: Insufficient documentation

## 2021-02-11 DIAGNOSIS — R4182 Altered mental status, unspecified: Secondary | ICD-10-CM | POA: Diagnosis present

## 2021-02-11 LAB — URINALYSIS, ROUTINE W REFLEX MICROSCOPIC
Bacteria, UA: NONE SEEN
Bilirubin Urine: NEGATIVE
Glucose, UA: NEGATIVE mg/dL
Hgb urine dipstick: NEGATIVE
Ketones, ur: NEGATIVE mg/dL
Leukocytes,Ua: NEGATIVE
Nitrite: NEGATIVE
Protein, ur: 30 mg/dL — AB
Specific Gravity, Urine: 1.017 (ref 1.005–1.030)
pH: 5 (ref 5.0–8.0)

## 2021-02-11 LAB — CBC WITH DIFFERENTIAL/PLATELET
Abs Immature Granulocytes: 0.02 10*3/uL (ref 0.00–0.07)
Basophils Absolute: 0 10*3/uL (ref 0.0–0.1)
Basophils Relative: 1 %
Eosinophils Absolute: 0 10*3/uL (ref 0.0–0.5)
Eosinophils Relative: 1 %
HCT: 39.2 % (ref 36.0–46.0)
Hemoglobin: 13.1 g/dL (ref 12.0–15.0)
Immature Granulocytes: 0 %
Lymphocytes Relative: 17 %
Lymphs Abs: 1 10*3/uL (ref 0.7–4.0)
MCH: 32.1 pg (ref 26.0–34.0)
MCHC: 33.4 g/dL (ref 30.0–36.0)
MCV: 96.1 fL (ref 80.0–100.0)
Monocytes Absolute: 0.5 10*3/uL (ref 0.1–1.0)
Monocytes Relative: 7 %
Neutro Abs: 4.5 10*3/uL (ref 1.7–7.7)
Neutrophils Relative %: 74 %
Platelets: 221 10*3/uL (ref 150–400)
RBC: 4.08 MIL/uL (ref 3.87–5.11)
RDW: 13.7 % (ref 11.5–15.5)
WBC: 6.1 10*3/uL (ref 4.0–10.5)
nRBC: 0 % (ref 0.0–0.2)

## 2021-02-11 LAB — BASIC METABOLIC PANEL
Anion gap: 11 (ref 5–15)
BUN: 12 mg/dL (ref 8–23)
CO2: 22 mmol/L (ref 22–32)
Calcium: 9.3 mg/dL (ref 8.9–10.3)
Chloride: 101 mmol/L (ref 98–111)
Creatinine, Ser: 1.05 mg/dL — ABNORMAL HIGH (ref 0.44–1.00)
GFR, Estimated: 59 mL/min — ABNORMAL LOW (ref >60)
Glucose, Bld: 124 mg/dL — ABNORMAL HIGH (ref 70–99)
Potassium: 3.7 mmol/L (ref 3.5–5.1)
Sodium: 134 mmol/L — ABNORMAL LOW (ref 135–145)

## 2021-02-11 NOTE — ED Provider Notes (Signed)
[facility] EMERGENCY DEPARTMENT Provider Note   CSN: [id number] Arrival date & time: 02/11/21  1851     History Chief Complaint  Patient presents with  . Altered Mental Status    Lindsey Leon is a 65 y.o. female.  Patient with history of dementia presented to the ER for altered mental status.  Per facility she appeared more confused than normal.  Otherwise no reports of fevers or cough or vomiting or diarrhea.  No reports of pain.  Patient received a COVID booster today.  Staff states that she feels fine denies any pain and is not sure why she is here.        Past Medical History:  Diagnosis Date  . Chronic hepatitis C (HCC)    dx'ed in 12/2018  . Dementia (HCC)   . Diverticulitis   . HIV infection (HCC)    dx'ed in 12/2018  . Hypertension    not on medications  . Pancreatitis   . Stroke [facility])     Patient Active Problem List   Diagnosis Date Noted  . Diplopia 01/01/2021  . 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 HTN (hypertension) 01/01/2021  . Routine screening for STI (sexually transmitted infection) 12/01/2019  . Type 2 diabetes mellitus with hyperglycemia (HCC) 02/2019  . Dementia (HCC) 12/27/2018  . Human immunodeficiency virus (HIV) disease (HCC) 12/25/2018  . Carotid artery occlusion 12/25/2018  . CVA (cerebral vascular accident) (HCC) 12/25/2018  . Chronic hepatitis C without hepatic coma (HCC) 12/2018  . TIA (transient ischemic attack) 12/06/2018  . ICH (intracerebral hemorrhage) (HCC) 05/23/2014  . Diverticulitis of colon 02/24/2013  . Pancreatitis, Hx of 02/24/2013  . Alcohol abuse 02/24/2013    Past Surgical History:  Procedure Laterality Date  . FLEXIBLE SIGMOIDOSCOPY N/A 02/26/2013   Procedure: FLEXIBLE SIGMOIDOSCOPY;  Surgeon: Theda Belfast, MD;  Location: [facility] ENDOSCOPY;  Service: Endoscopy;  Laterality: N/A;     OB History   No obstetric history on file.     Family History  Problem Relation Age of Onset  .  Healthy Father   . Cancer Mother        passed away. unknown age  . Hypertension Sister   . Diabetes Mellitus II Sister     Social History   Tobacco Use  . Smoking status: Former Smoker    Packs/day: 0.50    Years: 30.00    Pack years: 15.00    Types: Cigarettes    Quit date: 05/22/2015    Years since quitting: 5.7  . Smokeless tobacco: Never Used  Vaping Use  . Vaping Use: Never used  Substance Use Topics  . Alcohol use: No    Alcohol/week: 0.0 standard drinks  . Drug use: Not Currently    Types: Marijuana    Home Medications Prior to Admission medications   Medication Sig Start Date End Date Taking? Authorizing Provider  acetaminophen (TYLENOL) 325 MG tablet Take 650 mg by mouth 2 (two) times daily as needed for moderate pain or mild pain.    [provider]  albuterol (VENTOLIN HFA) 108 (90 Base) MCG/ACT inhaler Inhale 2 puffs into the lungs 3 (three) times daily as needed for wheezing or shortness of breath.    [provider]  B Complex Vitamins (VITAMIN B COMPLEX) TABS Take 1 tablet by mouth daily with breakfast.    [provider]  bictegravir-emtricitabine-tenofovir AF (BIKTARVY) 50-200-25 MG TABS tablet TAKE 1 TABLET BY MOUTH DAILY. 07/30/20 07/30/21  Comer,  Belia Heman, MD  Calcium Carbonate Antacid (TUMS ULTRA 1000 PO) Take 2 tablets by mouth daily as needed (indigestion).    [provider]  Cholecalciferol (VITAMIN D) 50 MCG (2000 UT) tablet Take 2,000 Units by mouth daily.    [provider]  clopidogrel (PLAVIX) 75 MG tablet Take 1 tablet (75 mg total) by mouth daily. 02/03/21   Sherryll Burger, Pratik D, DO  famotidine (PEPCID) 20 MG tablet Take 20 mg by mouth daily.    [provider]  ferrous gluconate (FERGON) 324 MG tablet Take 324 mg by mouth daily with breakfast. Patient not taking: Reported on 01/14/2021    [provider]  gabapentin (NEURONTIN) 100 MG capsule Take 100 mg by mouth See admin instructions. Take  1 capsule by mouth twice daily at 4PM and bedtime. Take bedtime dose with a 800mg  capsule for a total dose of 900mg  at bedtime. Patient not taking: Reported on 01/14/2021    [provider]  gabapentin (NEURONTIN) 100 MG capsule Take 100 mg by mouth at bedtime.    [provider]  gabapentin (NEURONTIN) 300 MG capsule Take 300 mg by mouth in the morning.    [provider]  gabapentin (NEURONTIN) 800 MG tablet Take 800 mg by mouth at bedtime. Take with a 100mg  capsule to equal 100mg  at bedtime.    [provider]  hydrALAZINE (APRESOLINE) 10 MG tablet Take 10 mg by mouth daily as needed (SBP >70 OR DBP > 100).    [provider]  insulin glargine (LANTUS) 100 UNIT/ML injection Inject 13 Units into the skin at bedtime.    [provider]  Lidocaine 4 % PTCH Apply 1 patch topically daily. Patient not taking: Reported on 01/14/2021    [provider]  losartan (COZAAR) 50 MG tablet Take 75 mg by mouth daily at 12 noon.    [provider]  metoprolol succinate (TOPROL-XL) 50 MG 24 hr tablet Take 50 mg by mouth daily. Take with or immediately following a meal.    [provider]  polyethylene glycol powder (GLYCOLAX/MIRALAX) 17 GM/SCOOP powder Take 17 g by mouth daily as needed for mild constipation or moderate constipation. Mix in 4-6 oz of liquid and take by mouth    [provider]  sennosides-docusate sodium (SENOKOT-S) 8.6-50 MG tablet Take 2 tablets by mouth at bedtime.    [provider]    Allergies    Patient has no known allergies.  Review of Systems   Review of Systems  Unable to perform ROS: Dementia    Physical Exam Updated Vital Signs BP (!) 197/65   Pulse 79   Temp 99.4 F (37.4 C) (Oral)   Resp 19   SpO2 98%   Physical Exam Constitutional:      General: She is not in acute distress.    Appearance: Normal appearance.  HENT:     Head: Normocephalic.     Nose: Nose normal.   Eyes:     Extraocular Movements: Extraocular movements intact.  Cardiovascular:     Rate and Rhythm: Normal rate.  Pulmonary:     Effort: Pulmonary effort is normal.  Musculoskeletal:        General: Normal range of motion.     Cervical back: Normal range of motion.  Neurological:     General: No focal deficit present.     Mental Status: She is alert. Mental status is at baseline.     Comments: Patient appears comfortable is awake  and alert answers my questions and follows commands.     ED Results / Procedures / Treatments   Labs (all labs ordered are listed, but only abnormal results are displayed) Labs Reviewed  BASIC METABOLIC PANEL - Abnormal; Notable for the following components:      Result Value   Sodium 134 (*)    Glucose, Bld 124 (*)    Creatinine, Ser 1.05 (*)    GFR, Estimated 59 (*)    All other components within normal limits  URINALYSIS, ROUTINE W REFLEX MICROSCOPIC - Abnormal; Notable for the following components:   APPearance HAZY (*)    Protein, ur 30 (*)    All other components within normal limits  CBC WITH DIFFERENTIAL/PLATELET    EKG None  Radiology CT Head Wo Contrast  Result Date: 02/11/2021 CLINICAL DATA:  Confusion. EXAM: CT HEAD WITHOUT CONTRAST TECHNIQUE: Contiguous axial images were obtained from the base of the skull through the vertex without intravenous contrast. COMPARISON:  January 14, 2021 FINDINGS: Brain: There is moderate severity cerebral atrophy with widening of the extra-axial spaces and ventricular dilatation. There are areas of decreased attenuation within the white matter tracts of the supratentorial brain, consistent with microvascular disease changes. Stable bilateral basal ganglia calcification is seen. Small chronic bilateral basal ganglia and right pons infarcts are again noted. Vascular: No hyperdense vessels are identified. Skull: Normal. Negative for fracture or focal lesion. Sinuses/Orbits: No acute finding. Other: None.  IMPRESSION: 1. Generalized cerebral atrophy. 2. No acute intracranial abnormality. Electronically Signed   By: Aram Candela M.D.   On: 02/11/2021 21:48   DG Chest Port 1 View  Result Date: 02/11/2021 CLINICAL DATA:  Confusion EXAM: PORTABLE CHEST 1 VIEW COMPARISON:  01/13/2021 FINDINGS: Single frontal view of the chest demonstrates postsurgical changes from median sternotomy. The cardiac silhouette is stable. No acute airspace disease, effusion, or pneumothorax. No acute bony abnormalities. IMPRESSION: 1. No acute intrathoracic process. Electronically Signed   By: Sharlet Salina M.D.   On: 02/11/2021 19:45    Procedures Procedures   Medications Ordered in ED Medications - No data to display  ED Course  I have reviewed the triage vital signs and the nursing notes.  Pertinent labs & imaging results that were available during my care of the patient were reviewed by me and considered in my medical decision making (see chart for details).    MDM Rules/Calculators/A&P                          Labs unremarkable white count normal chemistry normal.  No evidence of urinary tract infection.  CT scan of the brain is unremarkable as well.  Patient had a mildly elevated temperature, perhaps due to her recent vaccination.  No evidence of other infectious etiologies found.  Patient discharged back to facility in stable condition.  Final Clinical Impression(s) / ED Diagnoses Final diagnoses:  Altered mental status, unspecified altered mental status type    Rx / DC Orders ED Discharge Orders    None       Cheryll Cockayne, MD 02/11/21 [phone]

## 2021-02-11 NOTE — ED Triage Notes (Signed)
Pt BIB [facility] from [facility] c/o of AMS. Pt has a hx of dementia but per staff has been confused today. Pt received COVID booster yesterday afternoon and has also been c/o of generalized weakness since. Pt denies any pain at this time. Per facility pt is normally alert and oriented. Pt is alert and oriented x2 currently.

## 2021-02-11 NOTE — Discharge Instructions (Addendum)
Call your primary care doctor or specialist as discussed in the next 2-3 days.   Return immediately back to the ER if:  Your symptoms worsen within the next 12-24 hours. You develop new symptoms such as new fevers, persistent vomiting, new pain, shortness of breath, or new weakness or numbness, or if you have any other concerns.  

## 2021-02-11 NOTE — ED Notes (Signed)
[facility] called  

## 2021-02-12 NOTE — ED Notes (Signed)
Pt brief changed. Peri care provided.  

## 2021-02-17 ENCOUNTER — Other Ambulatory Visit (HOSPITAL_COMMUNITY): Payer: Self-pay

## 2021-02-21 ENCOUNTER — Other Ambulatory Visit (HOSPITAL_COMMUNITY): Payer: Self-pay

## 2021-02-23 ENCOUNTER — Other Ambulatory Visit (HOSPITAL_COMMUNITY): Payer: Self-pay

## 2021-03-01 ENCOUNTER — Other Ambulatory Visit (HOSPITAL_COMMUNITY): Payer: Self-pay

## 2021-03-01 MED FILL — Bictegravir-Emtricitabine-Tenofovir AF Tab 50-200-25 MG: ORAL | 30 days supply | Qty: 30 | Fill #1 | Status: AC

## 2021-03-17 ENCOUNTER — Inpatient Hospital Stay: Payer: Self-pay | Admitting: Neurology

## 2021-03-28 ENCOUNTER — Ambulatory Visit (INDEPENDENT_AMBULATORY_CARE_PROVIDER_SITE_OTHER): Payer: Medicaid Other | Admitting: Neurology

## 2021-03-28 ENCOUNTER — Encounter: Payer: Self-pay | Admitting: Neurology

## 2021-03-28 VITALS — BP 141/65 | HR 56 | Ht 68.0 in | Wt 168.0 lb

## 2021-03-28 DIAGNOSIS — I6503 Occlusion and stenosis of bilateral vertebral arteries: Secondary | ICD-10-CM | POA: Diagnosis not present

## 2021-03-28 DIAGNOSIS — R413 Other amnesia: Secondary | ICD-10-CM

## 2021-03-28 DIAGNOSIS — G3184 Mild cognitive impairment, so stated: Secondary | ICD-10-CM

## 2021-03-28 DIAGNOSIS — Z8673 Personal history of transient ischemic attack (TIA), and cerebral infarction without residual deficits: Secondary | ICD-10-CM

## 2021-03-28 DIAGNOSIS — I6521 Occlusion and stenosis of right carotid artery: Secondary | ICD-10-CM

## 2021-03-28 NOTE — Progress Notes (Signed)
[facility] [address] [place]. [place] [address] [phone]       OFFICE CONSULT NOTE  [name] Date of Birth:  02-Oct-1956 Medical Record Number:  [id number]   Referring MD: Marvel Plan  Reason for Referral: Stroke  HPI: [name] is a 65 year old pleasant African-American lady seen today for initial office consultation visit stroke.  History is obtained from the patient and review of electronic medical records and I personally reviewed pertinent imaging films in PACS.  She has past medical history for diabetes, hypertension, hepatitis C, HIV positive, pancreatitis, prior left temporal hemorrhagic stroke and multifocal multivessel stenosis with known chronic right common carotid artery stenosis.  She presented on 01/01/2021 with sudden onset of double vision.  She woke up that morning saying the vision is funny.  It got worse during the course of the day prompting visit to the ER.  She denies any slurred speech, extremity weakness numbness gait or balance problems.  On exam she was found to have left internuclear ophthalmoplegia and MRI scan of the brain was obtained which showed a small dorsal pontine as well as left subcortical corona radiata lacunar infarcts.  CT angiogram showed chronic right common carotid artery occlusion with 80% right ICA stenosis and left greater than right carotid siphon stenosis and bilateral vertebral origin stenosis right more than left.  2D echo showed ejection fraction of 6065%.  LDL cholesterol was low at 20 mg percent and hemoglobin A1c was 6.9.  Patient was on aspirin 325 and Plavix 75 mg daily prior to admission this was changed to aspirin 81 mg and Brilinta 90 mg twice daily for 30 days and then Plavix alone.  Patient is currently in [facility] for the last 2 to 3 years.  She is getting therapies.  She states her diplopia resolved within a few days.  Gait and balance have improved.  Her main complaint today is  decreased short-term memory and cognitive difficulties following her stroke.  She is remains on Plavix which is tolerating well without bruising or bleeding.  She takes her blood pressure is under good control and today was 141/65.  She states her sugars are also doing quite good.  She remains on Lipitor restarting well without muscle aches and pains.  She is able to ambulate with a walker and can walk independently.  Has not had any work-up for reversible causes of memory loss.  She has prior history of TIA with transient speech difficulties on 12/07/2018.  In August 2015 she was admitted for left temporal infarct cranial hemorrhage in the time urine drug screen was positive for cannabis MRI showed bilateral chronic basal ganglia infarcts.  ROS:   14 system review of systems is positive for memory loss, imbalance, diplopia, gait difficulty, all other systems negative  PMH:  Past Medical History:  Diagnosis Date   Chronic hepatitis C (HCC)    dx'ed in 12/2018   Dementia (HCC)    Diverticulitis    HIV infection (HCC)    dx'ed in 12/2018   Hypertension    not on medications   Pancreatitis    Stroke [facility])     Social History:  Social History   Socioeconomic History   Marital status: Single    Spouse name: Not on file   Number of children: 1   Years of education: 12   Highest education level: Not on file  Occupational History   Not on file  Tobacco Use   Smoking status:  Former    Packs/day: 0.50    Years: 30.00    Pack years: 15.00    Types: Cigarettes    Quit date: 05/22/2015    Years since quitting: 5.8   Smokeless tobacco: Never  Vaping Use   Vaping Use: Never used  Substance and Sexual Activity   Alcohol use: No    Alcohol/week: 0.0 standard drinks   Drug use: Not Currently    Types: Marijuana   Sexual activity: Never  Other Topics Concern   Not on file  Social History Narrative   Lives att [facility] facility    Patient is right handed.   Drinks no caffeine daily    Social Determinants of Corporate investment banker Strain: Not on file  Food Insecurity: Not on file  Transportation Needs: Not on file  Physical Activity: Not on file  Stress: Not on file  Social Connections: Not on file  Intimate Partner Violence: Not on file    Medications:   Current Outpatient Medications on File Prior to Visit  Medication Sig Dispense Refill   acetaminophen (TYLENOL) 325 MG tablet Take 650 mg by mouth 2 (two) times daily as needed for moderate pain or mild pain.     albuterol (VENTOLIN HFA) 108 (90 Base) MCG/ACT inhaler Inhale 2 puffs into the lungs 3 (three) times daily as needed for wheezing or shortness of breath.     atorvastatin (LIPITOR) 10 MG tablet Take 10 mg by mouth daily.     B Complex Vitamins (VITAMIN B COMPLEX) TABS Take 1 tablet by mouth daily with breakfast.     bictegravir-emtricitabine-tenofovir AF (BIKTARVY) 50-200-25 MG TABS tablet TAKE 1 TABLET BY MOUTH DAILY. 30 tablet 6   Calcium Carbonate Antacid (TUMS ULTRA 1000 PO) Take 2 tablets by mouth daily as needed (indigestion).     Cholecalciferol (VITAMIN D) 50 MCG (2000 UT) tablet Take 2,000 Units by mouth daily.     clopidogrel (PLAVIX) 75 MG tablet Take 1 tablet (75 mg total) by mouth daily. 30 tablet 0   famotidine (PEPCID) 20 MG tablet Take 20 mg by mouth daily.     ferrous gluconate (FERGON) 324 MG tablet Take 324 mg by mouth daily with breakfast.     furosemide (LASIX) 20 MG tablet Take 20 mg by mouth.     gabapentin (NEURONTIN) 100 MG capsule Take 100 mg by mouth See admin instructions. Take 1 capsule by mouth twice daily at 4PM and bedtime. Take bedtime dose with a 800mg  capsule for a total dose of 900mg  at bedtime.     gabapentin (NEURONTIN) 100 MG capsule Take 100 mg by mouth at bedtime.     gabapentin (NEURONTIN) 300 MG capsule Take 300 mg by mouth in the morning.     gabapentin (NEURONTIN) 800 MG tablet Take 800 mg by mouth at bedtime. Take with a 100mg  capsule to equal 100mg  at  bedtime.     insulin glargine (LANTUS) 100 UNIT/ML injection Inject 13 Units into the skin at bedtime.     losartan (COZAAR) 50 MG tablet Take 75 mg by mouth daily at 12 noon.     metoprolol succinate (TOPROL-XL) 50 MG 24 hr tablet Take 50 mg by mouth daily. Take with or immediately following a meal.     polyethylene glycol powder (GLYCOLAX/MIRALAX) 17 GM/SCOOP powder Take 17 g by mouth daily as needed for mild constipation or moderate constipation. Mix in 4-6 oz of liquid and take by mouth     sennosides-docusate sodium (SENOKOT-S)  8.6-50 MG tablet Take 2 tablets by mouth at bedtime.     hydrALAZINE (APRESOLINE) 10 MG tablet Take 10 mg by mouth daily as needed (SBP >70 OR DBP > 100).     Lidocaine 4 % PTCH Apply 1 patch topically daily.     No current facility-administered medications on file prior to visit.    Allergies:  No Known Allergies  Physical Exam General: well developed, well nourished, middle-aged lady seated, in no evident distress Head: head normocephalic and atraumatic.   Neck: supple with no carotid or supraclavicular bruits Cardiovascular: regular rate and rhythm, no murmurs Musculoskeletal: no deformity Skin:  no rash/petichiae Vascular:  Normal pulses all extremities  Neurologic Exam Mental Status: Awake and fully alert. Oriented to place and time. Recent and remote memory intact. Attention span, concentration and fund of knowledge appropriate. Mood and affect appropriate.  Diminished recall 2/3.  Able to name only 8 animals which can walk on 4 legs.  Clock drawing 4/4. Cranial Nerves: Fundoscopic exam reveals sharp disc margins. Pupils equal, briskly reactive to light. Extraocular movements full without nystagmus with mild exotropia of the left eye.. Visual fields full to confrontation. Hearing intact. Facial sensation intact. Face, tongue, palate moves normally and symmetrically.  Motor: Normal bulk and tone. Normal strength in all tested extremity muscles. Sensory.:  intact to touch , pinprick , position and vibratory sensation.  Coordination: Rapid alternating movements normal in all extremities. Finger-to-nose and heel-to-shin performed accurately bilaterally. Gait and Station: Arises from chair without difficulty. Stance is broad-based.   . Gait demonstrates mild ataxia with turns.  Unable to do tandem walking Reflexes: 1+ and symmetric. Toes downgoing.   NIHSS  1 Modified Rankin  2   ASSESSMENT: 65 year old lady with pontine and left subcortical white matter infarcts in March 2022 likely from small vessel disease who also has significant known chronic Multivessel extracranial large vessel disease also.  Remote history of left temporal intracerebral hemorrhage in 2015 .vascular risk factors of diabetes, hypertension, hyperlipidemia, large vessel extracranial multivessel stenosis.  She is also complaining of memory loss and mild cognitive impairment poststroke     PLAN: I had a long discussion with the patient regarding her recent strokes and discuss results of imaging studies and strategies for stroke prevention and answered questions.  We also discussed complains of subjective memory loss and mild cognitive impairment and need for further work-up.  I recommend she stay on Plavix for stroke prevention and maintain aggressive risk factor modification with strict control of hypertension with blood pressure goal below 130/90, lipids with LDL cholesterol goal below 70 mg percent and diabetes with hemoglobin A1c goal below 6.5%.  She is also check lab work for reversible causes of memory loss and EEG.  I encouraged her to increase participation in cognitively challenging activities like solving crossword puzzles, playing bridge and sodoku.  We also discussed memory compensation strategies.  She will return for follow-up in the future in 2 months or call earlier if necessary.  Greater than 50% time during this 45-minute consultation visit was spent in counseling and  coordination of care about her stroke and Delia Heady, MD Note: This document was prepared with digital dictation and possible smart phrase technology. Any transcriptional errors that result from this process are unintentional.

## 2021-03-28 NOTE — Patient Instructions (Signed)
I had a long discussion with the patient regarding her recent strokes and discuss results of imaging studies and strategies for stroke prevention and answered questions.  We also discussed complains of subjective memory loss and mild cognitive impairment and need for further work-up.  I recommend she stay on Plavix for stroke prevention and maintain aggressive risk factor modification with strict control of hypertension with blood pressure goal below 130/90, lipids with LDL cholesterol goal below 70 mg percent and diabetes with hemoglobin A1c goal below 6.5%.  She is also check lab work for reversible causes of memory loss and EEG.  I encouraged her to increase participation in cognitively challenging activities like solving crossword puzzles, playing bridge and sodoku.  We also discussed memory compensation strategies.  She will return for follow-up in the future in 2 months or call earlier if necessary. Memory Compensation Strategies  Use "WARM" strategy.  W= write it down  A= associate it  R= repeat it  M= make a mental note  2.   You can keep a Glass blower/designer.  Use a 3-ring notebook with sections for the following: calendar, important names and phone numbers,  medications, doctors' names/phone numbers, lists/reminders, and a section to journal what you did  each day.   3.    Use a calendar to write appointments down.  4.    Write yourself a schedule for the day.  This can be placed on the calendar or in a separate section of the Memory Notebook.  Keeping a  regular schedule can help memory.  5.    Use medication organizer with sections for each day or morning/evening pills.  You may need help loading it  6.    Keep a basket, or pegboard by the door.  Place items that you need to take out with you in the basket or on the pegboard.  You may also want to  include a message board for reminders.  7.    Use sticky notes.  Place sticky notes with reminders in a place where the task is  performed.  For example: " turn off the  stove" placed by the stove, "lock the door" placed on the door at eye level, " take your medications" on  the bathroom mirror or by the place where you normally take your medications.  8.    Use alarms/timers.  Use while cooking to remind yourself to check on food or as a reminder to take your medicine, or as a  reminder to make a call, or as a reminder to perform another task, etc. Stroke Prevention Some medical conditions and behaviors are associated with a higher chance of having a stroke. You can help prevent a stroke by making nutrition, lifestyle,and other changes, including managing any medical conditions you may have. What nutrition changes can be made?  Eat healthy foods. You can do this by: Choosing foods high in fiber, such as fresh fruits and vegetables and whole grains. Eating at least 5 or more servings of fruits and vegetables a day. Try to fill half of your plate at each meal with fruits and vegetables. Choosing lean protein foods, such as lean cuts of meat, poultry without skin, fish, tofu, beans, and nuts. Eating low-fat dairy products. Avoiding foods that are high in salt (sodium). This can help lower blood pressure. Avoiding foods that have saturated fat, trans fat, and cholesterol. This can help prevent high cholesterol. Avoiding processed and premade foods. Follow your health care provider's specific guidelines for  losing weight, controlling high blood pressure (hypertension), lowering high cholesterol, and managing diabetes. These may include: Reducing your daily calorie intake. Limiting your daily sodium intake to 1,500 milligrams (mg). Using only healthy fats for cooking, such as olive oil, canola oil, or sunflower oil. Counting your daily carbohydrate intake. What lifestyle changes can be made? Maintain a healthy weight. Talk to your health care provider about your ideal weight. Get at least 30 minutes of moderate physical  activity at least 5 days a week. Moderate activity includes brisk walking, biking, and swimming. Do not use any products that contain nicotine or tobacco, such as cigarettes and e-cigarettes. If you need help quitting, ask your health care provider. It may also be helpful to avoid exposure to secondhand smoke. Limit alcohol intake to no more than 1 drink a day for nonpregnant women and 2 drinks a day for men. One drink equals 12 oz of beer, 5 oz of wine, or 1 oz of hard liquor. Stop any illegal drug use. Avoid taking birth control pills. Talk to your health care provider about the risks of taking birth control pills if: You are over 50 years old. You smoke. You get migraines. You have ever had a blood clot. What other changes can be made? Manage your cholesterol levels. Eating a healthy diet is important for preventing high cholesterol. If cholesterol cannot be managed through diet alone, you may also need to take medicines. Take any prescribed medicines to control your cholesterol as told by your health care provider. Manage your diabetes. Eating a healthy diet and exercising regularly are important parts of managing your blood sugar. If your blood sugar cannot be managed through diet and exercise, you may need to take medicines. Take any prescribed medicines to control your diabetes as told by your health care provider. Control your hypertension. To reduce your risk of stroke, try to keep your blood pressure below 130/80. Eating a healthy diet and exercising regularly are an important part of controlling your blood pressure. If your blood pressure cannot be managed through diet and exercise, you may need to take medicines. Take any prescribed medicines to control hypertension as told by your health care provider. Ask your health care provider if you should monitor your blood pressure at home. Have your blood pressure checked every year, even if your blood pressure is normal. Blood pressure  increases with age and some medical conditions. Get evaluated for sleep disorders (sleep apnea). Talk to your health care provider about getting a sleep evaluation if you snore a lot or have excessive sleepiness. Take over-the-counter and prescription medicines only as told by your health care provider. Aspirin or blood thinners (antiplatelets or anticoagulants) may be recommended to reduce your risk of forming blood clots that can lead to stroke. Make sure that any other medical conditions you have, such as atrial fibrillation or atherosclerosis, are managed. What are the warning signs of a stroke? The warning signs of a stroke can be easily remembered as BEFAST. B is for balance. Signs include: Dizziness. Loss of balance or coordination. Sudden trouble walking. E is for eyes. Signs include: A sudden change in vision. Trouble seeing. F is for face. Signs include: Sudden weakness or numbness of the face. The face or eyelid drooping to one side. A is for arms. Signs include: Sudden weakness or numbness of the arm, usually on one side of the body. S is for speech. Signs include: Trouble speaking (aphasia). Trouble understanding. T is for time. These symptoms may  represent a serious problem that is an emergency. Do not wait to see if the symptoms will go away. Get medical help right away. Call your local emergency services (911 in the [place] Do not drive yourself to the hospital. Other signs of stroke may include: A sudden, severe headache with no known cause. Nausea or vomiting. Seizure. Where to find more information For more information, visit: American Stroke Association: www.strokeassociation.org National Stroke Association: www.stroke.org Summary You can prevent a stroke by eating healthy, exercising, not smoking, limiting alcohol intake, and managing any medical conditions you may have. Do not use any products that contain nicotine or tobacco, such as cigarettes and e-cigarettes.  If you need help quitting, ask your health care provider. It may also be helpful to avoid exposure to secondhand smoke. Remember BEFAST for warning signs of stroke. Get help right away if you or a loved one has any of these signs. This information is not intended to replace advice given to you by your health care provider. Make sure you discuss any questions you have with your healthcare provider. Document Revised: 09/14/2017 Document Reviewed: 11/07/2016 Elsevier Patient Education  2021 [employer].

## 2021-03-29 ENCOUNTER — Other Ambulatory Visit (HOSPITAL_COMMUNITY): Payer: Self-pay

## 2021-03-29 LAB — DEMENTIA PANEL
Homocysteine: 11.2 umol/L (ref 0.0–17.2)
RPR Ser Ql: NONREACTIVE
TSH: 1.18 u[IU]/mL (ref 0.450–4.500)
Vitamin B-12: 568 pg/mL (ref 232–1245)

## 2021-03-31 ENCOUNTER — Other Ambulatory Visit (HOSPITAL_COMMUNITY): Payer: Self-pay

## 2021-04-02 NOTE — Progress Notes (Signed)
Kindly inform the patient that lab work for reversible causes of memory loss was all normal

## 2021-04-04 ENCOUNTER — Other Ambulatory Visit: Payer: Self-pay

## 2021-04-04 ENCOUNTER — Other Ambulatory Visit: Payer: Self-pay | Admitting: Internal Medicine

## 2021-04-04 ENCOUNTER — Other Ambulatory Visit (HOSPITAL_COMMUNITY): Payer: Self-pay

## 2021-04-04 ENCOUNTER — Ambulatory Visit: Payer: Medicaid Other | Admitting: Neurology

## 2021-04-04 DIAGNOSIS — R410 Disorientation, unspecified: Secondary | ICD-10-CM | POA: Diagnosis not present

## 2021-04-04 DIAGNOSIS — B20 Human immunodeficiency virus [HIV] disease: Secondary | ICD-10-CM

## 2021-04-04 DIAGNOSIS — R413 Other amnesia: Secondary | ICD-10-CM

## 2021-04-05 ENCOUNTER — Telehealth: Payer: Self-pay | Admitting: Emergency Medicine

## 2021-04-05 ENCOUNTER — Other Ambulatory Visit (HOSPITAL_COMMUNITY): Payer: Self-pay

## 2021-04-05 ENCOUNTER — Other Ambulatory Visit: Payer: Self-pay | Admitting: Internal Medicine

## 2021-04-05 DIAGNOSIS — B20 Human immunodeficiency virus [HIV] disease: Secondary | ICD-10-CM

## 2021-04-05 MED ORDER — BIKTARVY 50-200-25 MG PO TABS
1.0000 | ORAL_TABLET | Freq: Every day | ORAL | 2 refills | Status: DC
  Filled 2021-04-05: qty 30, 30d supply, fill #0
  Filled 2021-04-28: qty 30, 30d supply, fill #1

## 2021-04-05 NOTE — Telephone Encounter (Signed)
-----   Message from [name], MD sent at 04/02/2021  5:10 PM EDT ----- Joneen Roach inform the patient that lab work for reversible causes of memory loss was all normal

## 2021-04-11 ENCOUNTER — Telehealth: Payer: Self-pay | Admitting: *Deleted

## 2021-04-11 NOTE — Progress Notes (Signed)
Kindly inform the patient that EEG study was normal

## 2021-04-11 NOTE — Telephone Encounter (Signed)
Called and spoke w/ pt about EEG results per Dr. Marlis Edelson note. Pt verbalized understanding.

## 2021-04-11 NOTE — Telephone Encounter (Signed)
-----   Message from Pramod S Sethi, MD sent at 04/11/2021  5:07 PM EDT ----- Kindly inform the patient that EEG study was normal 

## 2021-04-28 ENCOUNTER — Other Ambulatory Visit (HOSPITAL_COMMUNITY): Payer: Self-pay

## 2021-05-09 ENCOUNTER — Other Ambulatory Visit (HOSPITAL_COMMUNITY): Payer: Self-pay

## 2021-05-17 ENCOUNTER — Telehealth: Payer: Self-pay

## 2021-05-17 ENCOUNTER — Other Ambulatory Visit (HOSPITAL_COMMUNITY): Payer: Self-pay

## 2021-05-17 DIAGNOSIS — B20 Human immunodeficiency virus [HIV] disease: Secondary | ICD-10-CM

## 2021-05-17 MED ORDER — BIKTARVY 50-200-25 MG PO TABS: 1.0000 | ORAL_TABLET | Freq: Every day | ORAL | 1 refills | Status: DC

## 2021-05-17 NOTE — Telephone Encounter (Signed)
-----   Message from Bobette Mo, CPhT sent at 05/17/2021  1:53 PM EDT ----- Regarding: Lindsey Leon ,  Would you be able to send this patients Biktarvy to [facility] on [address] please.    Thank You,  Clearance Coots, CPhT [facility] Patient [facility] Phone: [phone] Fax: [phone]

## 2021-05-17 NOTE — Telephone Encounter (Signed)
Called [facility] to cancel Biktarvy and resent to [facility] on [address].   Sandie Ano, RN

## 2021-05-30 ENCOUNTER — Other Ambulatory Visit (HOSPITAL_COMMUNITY): Payer: Self-pay

## 2021-06-14 ENCOUNTER — Other Ambulatory Visit (HOSPITAL_COMMUNITY): Payer: Self-pay

## 2021-06-16 ENCOUNTER — Other Ambulatory Visit (HOSPITAL_COMMUNITY): Payer: Self-pay

## 2021-06-29 ENCOUNTER — Encounter: Payer: Self-pay | Admitting: Internal Medicine

## 2021-06-29 ENCOUNTER — Ambulatory Visit (INDEPENDENT_AMBULATORY_CARE_PROVIDER_SITE_OTHER): Payer: Medicare Other | Admitting: Internal Medicine

## 2021-06-29 ENCOUNTER — Other Ambulatory Visit (HOSPITAL_COMMUNITY): Payer: Self-pay

## 2021-06-29 ENCOUNTER — Other Ambulatory Visit: Payer: Self-pay

## 2021-06-29 VITALS — BP 138/78 | HR 61 | Temp 97.2°F | Resp 16 | Ht 68.0 in | Wt 168.0 lb

## 2021-06-29 DIAGNOSIS — B20 Human immunodeficiency virus [HIV] disease: Secondary | ICD-10-CM | POA: Diagnosis present

## 2021-06-29 DIAGNOSIS — Z113 Encounter for screening for infections with a predominantly sexual mode of transmission: Secondary | ICD-10-CM | POA: Diagnosis not present

## 2021-06-29 DIAGNOSIS — I6521 Occlusion and stenosis of right carotid artery: Secondary | ICD-10-CM | POA: Diagnosis not present

## 2021-06-29 NOTE — Assessment & Plan Note (Signed)
She is doing well and no new concerns.  Continue with Biktarvy and she can rtc in 6 months.

## 2021-06-29 NOTE — Assessment & Plan Note (Signed)
Will screen 

## 2021-06-29 NOTE — Progress Notes (Signed)
   Subjective:    Patient ID: Lindsey Leon, female    DOB: 1956/10/10, 65 y.o.   MRN: [id number]  HPI Here for follow up of HIV She had labs done at her facility and her viral load was minimally elevated at about 400 (results in the scan queue so not available at this time.  Otherwise no concerns. No issues getting her medications at her facility.     Review of Systems  Constitutional:  Negative for fatigue.  Gastrointestinal:  Negative for diarrhea and nausea.  Skin:  Negative for rash.      Objective:   Physical Exam Eyes:     General: No scleral icterus. Pulmonary:     Effort: Pulmonary effort is normal.  Neurological:     Mental Status: She is alert.  Psychiatric:        Mood and Affect: Mood normal.          Assessment & Plan:

## 2021-06-30 LAB — T-HELPER CELL (CD4) - (RCID CLINIC ONLY)
CD4 % Helper T Cell: 28 % — ABNORMAL LOW (ref 33–65)
CD4 T Cell Abs: 681 /uL (ref 400–1790)

## 2021-07-01 LAB — RPR: RPR Ser Ql: NONREACTIVE

## 2021-07-01 LAB — HIV-1 RNA QUANT-NO REFLEX-BLD
HIV 1 RNA Quant: 101 Copies/mL — ABNORMAL HIGH
HIV-1 RNA Quant, Log: 2 Log cps/mL — ABNORMAL HIGH

## 2021-07-06 ENCOUNTER — Ambulatory Visit (INDEPENDENT_AMBULATORY_CARE_PROVIDER_SITE_OTHER): Payer: Medicare Other | Admitting: Neurology

## 2021-07-06 ENCOUNTER — Encounter: Payer: Self-pay | Admitting: Neurology

## 2021-07-06 VITALS — BP 159/64 | Ht 66.0 in | Wt 171.5 lb

## 2021-07-06 DIAGNOSIS — Z8673 Personal history of transient ischemic attack (TIA), and cerebral infarction without residual deficits: Secondary | ICD-10-CM | POA: Diagnosis not present

## 2021-07-06 DIAGNOSIS — R413 Other amnesia: Secondary | ICD-10-CM | POA: Diagnosis not present

## 2021-07-06 DIAGNOSIS — I6521 Occlusion and stenosis of right carotid artery: Secondary | ICD-10-CM

## 2021-07-06 DIAGNOSIS — G3184 Mild cognitive impairment, so stated: Secondary | ICD-10-CM

## 2021-07-06 MED ORDER — DONEPEZIL HCL 10 MG PO TABS: 10.0000 mg | ORAL_TABLET | Freq: Every day | ORAL | 3 refills | Status: DC

## 2021-07-06 NOTE — Progress Notes (Signed)
[facility] [address] [place]. [place] [address] [phone]       OFFICE CONSULT NOTE  [name] Date of Birth:  07/14/1956 Medical Record Number:  [id number]   Referring MD: Marvel Plan  Reason for Referral: Stroke  HPI: Ms. Kalmar is a 65 year old pleasant African-American lady seen today for initial office consultation visit stroke.  History is obtained from the patient and review of electronic medical records and I personally reviewed pertinent imaging films in PACS.  She has past medical history for diabetes, hypertension, hepatitis C, HIV positive, pancreatitis, prior left temporal hemorrhagic stroke and multifocal multivessel stenosis with known chronic right common carotid artery stenosis.  She presented on 01/01/2021 with sudden onset of double vision.  She woke up that morning saying the vision is funny.  It got worse during the course of the day prompting visit to the ER.  She denies any slurred speech, extremity weakness numbness gait or balance problems.  On exam she was found to have left internuclear ophthalmoplegia and MRI scan of the brain was obtained which showed a small dorsal pontine as well as left subcortical corona radiata lacunar infarcts.  CT angiogram showed chronic right common carotid artery occlusion with 80% right ICA stenosis and left greater than right carotid siphon stenosis and bilateral vertebral origin stenosis right more than left.  2D echo showed ejection fraction of 6065%.  LDL cholesterol was low at 20 mg percent and hemoglobin A1c was 6.9.  Patient was on aspirin 325 and Plavix 75 mg daily prior to admission this was changed to aspirin 81 mg and Brilinta 90 mg twice daily for 30 days and then Plavix alone.  Patient is currently in [facility] nursing home for the last 2 to 3 years.  She is getting therapies.  She states her diplopia resolved within a few days.  Gait and balance have improved.  Her main complaint today is  decreased short-term memory and cognitive difficulties following her stroke.  She is remains on Plavix which is tolerating well without bruising or bleeding.  She takes her blood pressure is under good control and today was 141/65.  She states her sugars are also doing quite good.  She remains on Lipitor restarting well without muscle aches and pains.  She is able to ambulate with a walker and can walk independently.  Has not had any work-up for reversible causes of memory loss.  She has prior history of TIA with transient speech difficulties on 12/07/2018.  In August 2015 she was admitted for left temporal infarct cranial hemorrhage in the time urine drug screen was positive for cannabis MRI showed bilateral chronic basal ganglia infarcts. Update 07/06/2021 ; She returns for follow-up after last visit 3 months ago.  She states she continues to have memory difficulties and trouble remembering recent information and calculations.  She had lab work done at last visit and vitamin B12, TSH and RPR were negative.  EEG done on 04/11/2021 was normal.  Patient continues to live in a skilled nursing facility.  She ambulates using a wheeled walker.  She has had no falls or injuries.  She remains on Plavix for stroke prevention which is tolerating well without bruising or bleeding.  She has had no recurrent stroke or TIA symptoms.  States her blood pressure under good control.  She is willing to try Aricept for memory loss. ROS:   14 system review of systems is positive for memory loss, difficulty with calculation, imbalance, diplopia, gait  difficulty, all other systems negative  PMH:  Past Medical History:  Diagnosis Date   Chronic hepatitis C (HCC)    dx'ed in 12/2018   Dementia (HCC)    Diverticulitis    HIV infection (HCC)    dx'ed in 12/2018   Hypertension    not on medications   Pancreatitis    Stroke [facility])     Social History:  Social History   Socioeconomic History   Marital status: Single    Spouse  name: Not on file   Number of children: 1   Years of education: 12   Highest education level: Not on file  Occupational History   Not on file  Tobacco Use   Smoking status: Former    Packs/day: 0.50    Years: 30.00    Pack years: 15.00    Types: Cigarettes    Quit date: 05/22/2015    Years since quitting: 6.1   Smokeless tobacco: Never  Vaping Use   Vaping Use: Never used  Substance and Sexual Activity   Alcohol use: No    Alcohol/week: 0.0 standard drinks   Drug use: Not Currently    Types: Marijuana   Sexual activity: Never  Other Topics Concern   Not on file  Social History Narrative   Lives att [place] [facility] facility    Patient is right handed.   Drinks no caffeine daily   Social Determinants of Corporate investment banker Strain: Not on file  Food Insecurity: Not on file  Transportation Needs: Not on file  Physical Activity: Not on file  Stress: Not on file  Social Connections: Not on file  Intimate Partner Violence: Not on file    Medications:   Current Outpatient Medications on File Prior to Visit  Medication Sig Dispense Refill   acetaminophen (TYLENOL) 325 MG tablet Take 650 mg by mouth 2 (two) times daily as needed for moderate pain or mild pain.     albuterol (VENTOLIN HFA) 108 (90 Base) MCG/ACT inhaler Inhale 2 puffs into the lungs 3 (three) times daily as needed for wheezing or shortness of breath.     atorvastatin (LIPITOR) 10 MG tablet Take 1 tablet by mouth daily.     B Complex Vitamins (VITAMIN B COMPLEX) TABS Take 1 tablet by mouth daily with breakfast.     bictegravir-emtricitabine-tenofovir AF (BIKTARVY) 50-200-25 MG TABS tablet TAKE 1 TABLET BY MOUTH DAILY. 30 tablet 1   Calcium Carbonate Antacid (TUMS ULTRA 1000 PO) Take 2 tablets by mouth daily as needed (indigestion).     chlorthalidone (HYGROTON) 25 MG tablet Take 25 mg by mouth daily.     Cholecalciferol (VITAMIN D) 50 MCG (2000 UT) tablet Take 2,000 Units by mouth daily.     clopidogrel  (PLAVIX) 75 MG tablet Take 1 tablet (75 mg total) by mouth daily. 30 tablet 0   famotidine (PEPCID) 20 MG tablet Take 20 mg by mouth daily.     furosemide (LASIX) 20 MG tablet Take 20 mg by mouth.     gabapentin (NEURONTIN) 100 MG capsule Take 100 mg by mouth at bedtime.     gabapentin (NEURONTIN) 300 MG capsule Take 300 mg by mouth daily.     gabapentin (NEURONTIN) 800 MG tablet Take 800 mg by mouth at bedtime. Take with a 100mg  capsule to equal 900mg  at bedtime.     insulin glargine (LANTUS) 100 UNIT/ML injection Inject 13 Units into the skin at bedtime.     losartan (COZAAR) 100 MG tablet  Take 100 mg by mouth daily.     Menthol, Topical Analgesic, (ICY HOT BACK EX) Apply topically.     Menthol, Topical Analgesic, 5 % GEL Apply topically.     metoprolol succinate (TOPROL-XL) 50 MG 24 hr tablet Take 50 mg by mouth daily. Take with or immediately following a meal.     Omega-3 Fatty Acids (FISH OIL) 1000 MG CAPS Take by mouth.     polyethylene glycol powder (GLYCOLAX/MIRALAX) 17 GM/SCOOP powder Take 17 g by mouth daily as needed for mild constipation or moderate constipation. Mix in 4-6 oz of liquid and take by mouth     potassium chloride SA (KLOR-CON) 20 MEQ tablet Take by mouth.     sennosides-docusate sodium (SENOKOT-S) 8.6-50 MG tablet Take 2 tablets by mouth at bedtime.     No current facility-administered medications on file prior to visit.    Allergies:  No Known Allergies  Physical Exam General: well developed, well nourished, middle-aged African-American lady middle-aged lady seated, in no evident distress Head: head normocephalic and atraumatic.   Neck: supple with no carotid or supraclavicular bruits Cardiovascular: regular rate and rhythm, no murmurs Musculoskeletal: no deformity Skin:  no rash/petichiae Vascular:  Normal pulses all extremities  Neurologic Exam Mental Status: Awake and fully alert. Oriented to place and time. Recent and remote memory intact. Attention span,  concentration and fund of knowledge appropriate. Mood and affect appropriate.  Diminished recall 2/3.  Able to name only 8 animals which can walk on 4 legs.  Clock drawing 4/4.  Mini-Mental status exam score 20/30 with deficits in orientation recall and reading.  Geriatric depression scale scored 7.  Able to name only 6 animals which can walk and forelegs.  Clock drawing 3/4. Cranial Nerves: Fundoscopic exam not done. Pupils equal, briskly reactive to light. Extraocular movements full without nystagmus with mild exotropia of the left eye.. Visual fields full to confrontation. Hearing intact. Facial sensation intact. Face, tongue, palate moves normally and symmetrically.  Motor: Normal bulk and tone. Normal strength in all tested extremity muscles. Sensory.: intact to touch , pinprick , position and vibratory sensation.  Coordination: Rapid alternating movements normal in all extremities. Finger-to-nose and heel-to-shin performed accurately bilaterally. Gait and Station: Arises from chair without difficulty. Stance is broad-based.   . Gait demonstrates mild ataxia with turns.  Uses a wheeled walker unable to do tandem walking Reflexes: 1+ and symmetric. Toes downgoing.   NIHSS  1 Modified Rankin  2   ASSESSMENT: 65 year old lady with pontine and left subcortical white matter infarcts in March 2022 likely from small vessel disease who also has significant known chronic Multivessel extracranial large vessel disease also.  Remote history of left temporal intracerebral hemorrhage in 2015 .vascular risk factors of diabetes, hypertension, hyperlipidemia, large vessel extracranial multivessel stenosis.  She is also complaining of memory loss and mild cognitive impairment poststroke     PLAN: I had a long discussion with the patient regarding her recent strokes and memory loss discussed results of lab results and EEG and strategies for stroke prevention and answered questions.  We also discussed complains  of subjective memory loss and mild cognitive impairment and recommend a trial of Aricept 5 mg daily for a month to be increase to 10 mg daily if tolerated without side effects.  I also encouraged her to increase participation in cognitively challenging activities like solving crossword puzzles, playing bridge and sodoku.  I recommend she stay on Plavix for stroke prevention and maintain aggressive risk factor  modification with strict control of hypertension with blood pressure goal below 130/90, lipids with LDL cholesterol goal below 70 mg percent and diabetes with hemoglobin A1c goal below 6.5%.  She is also check lab work for reversible causes of memory loss and EEG.  .  We also discussed memory compensation strategies.  She will return for follow-up in 3 months with Shanda Bumps my nurse practitioner on call earlier if necessary.Greater than 50% time during this 35-minute consultation visit was spent in counseling and coordination of care about her stroke and memory loss and answering questions Delia Heady, MD Note: This document was prepared with digital dictation and possible smart phrase technology. Any transcriptional errors that result from this process are unintentional.

## 2021-07-06 NOTE — Patient Instructions (Signed)
I had a long discussion with the patient regarding her recent strokes and memory loss discussed results of lab results and EEG and strategies for stroke prevention and answered questions.  We also discussed complains of subjective memory loss and mild cognitive impairment and recommend a trial of Aricept 5 mg daily for a month to be increase to 10 mg daily if tolerated without side effects.  I also encouraged her to increase participation in cognitively challenging activities like solving crossword puzzles, playing bridge and sodoku.  I recommend she stay on Plavix for stroke prevention and maintain aggressive risk factor modification with strict control of hypertension with blood pressure goal below 130/90, lipids with LDL cholesterol goal below 70 mg percent and diabetes with hemoglobin A1c goal below 6.5%.  She is also check lab work for reversible causes of memory loss and EEG.  .  We also discussed memory compensation strategies.  She will return for follow-up in 3 months with Shanda Bumps my nurse practitioner on call earlier if necessary. Memory Compensation Strategies  Use "WARM" strategy.  W= write it down  A= associate it  R= repeat it  M= make a mental note  2.   You can keep a Glass blower/designer.  Use a 3-ring notebook with sections for the following: calendar, important names and phone numbers,  medications, doctors' names/phone numbers, lists/reminders, and a section to journal what you did  each day.   3.    Use a calendar to write appointments down.  4.    Write yourself a schedule for the day.  This can be placed on the calendar or in a separate section of the Memory Notebook.  Keeping a  regular schedule can help memory.  5.    Use medication organizer with sections for each day or morning/evening pills.  You may need help loading it  6.    Keep a basket, or pegboard by the door.  Place items that you need to take out with you in the basket or on the pegboard.  You may also want to   include a message board for reminders.  7.    Use sticky notes.  Place sticky notes with reminders in a place where the task is performed.  For example: " turn off the  stove" placed by the stove, "lock the door" placed on the door at eye level, " take your medications" on  the bathroom mirror or by the place where you normally take your medications.  8.    Use alarms/timers.  Use while cooking to remind yourself to check on food or as a reminder to take your medicine, or as a  reminder to make a call, or as a reminder to perform another task, etc.

## 2021-07-22 ENCOUNTER — Emergency Department (HOSPITAL_COMMUNITY): Payer: Medicare Other

## 2021-07-22 ENCOUNTER — Observation Stay (HOSPITAL_COMMUNITY): Payer: Medicare Other

## 2021-07-22 ENCOUNTER — Other Ambulatory Visit: Payer: Self-pay

## 2021-07-22 ENCOUNTER — Inpatient Hospital Stay (HOSPITAL_COMMUNITY)
Admission: EM | Admit: 2021-07-22 | Discharge: 2021-07-26 | DRG: 065 | Disposition: A | Discharge status: Skilled Nursing Facility | Payer: Medicare Other | Source: Skilled Nursing Facility | Attending: Internal Medicine | Admitting: Internal Medicine

## 2021-07-22 DIAGNOSIS — F03A0 Unspecified dementia, mild, without behavioral disturbance, psychotic disturbance, mood disturbance, and anxiety: Secondary | ICD-10-CM

## 2021-07-22 DIAGNOSIS — I6523 Occlusion and stenosis of bilateral carotid arteries: Secondary | ICD-10-CM | POA: Diagnosis present

## 2021-07-22 DIAGNOSIS — I6521 Occlusion and stenosis of right carotid artery: Secondary | ICD-10-CM

## 2021-07-22 DIAGNOSIS — R29818 Other symptoms and signs involving the nervous system: Secondary | ICD-10-CM | POA: Diagnosis present

## 2021-07-22 DIAGNOSIS — Z7902 Long term (current) use of antithrombotics/antiplatelets: Secondary | ICD-10-CM

## 2021-07-22 DIAGNOSIS — K118 Other diseases of salivary glands: Secondary | ICD-10-CM | POA: Diagnosis present

## 2021-07-22 DIAGNOSIS — Z20822 Contact with and (suspected) exposure to covid-19: Secondary | ICD-10-CM | POA: Diagnosis present

## 2021-07-22 DIAGNOSIS — I6302 Cerebral infarction due to thrombosis of basilar artery: Secondary | ICD-10-CM

## 2021-07-22 DIAGNOSIS — I6529 Occlusion and stenosis of unspecified carotid artery: Secondary | ICD-10-CM | POA: Diagnosis present

## 2021-07-22 DIAGNOSIS — Z833 Family history of diabetes mellitus: Secondary | ICD-10-CM

## 2021-07-22 DIAGNOSIS — G8191 Hemiplegia, unspecified affecting right dominant side: Secondary | ICD-10-CM | POA: Diagnosis present

## 2021-07-22 DIAGNOSIS — F0390 Unspecified dementia without behavioral disturbance: Secondary | ICD-10-CM | POA: Diagnosis present

## 2021-07-22 DIAGNOSIS — R531 Weakness: Secondary | ICD-10-CM

## 2021-07-22 DIAGNOSIS — R29702 NIHSS score 2: Secondary | ICD-10-CM | POA: Diagnosis present

## 2021-07-22 DIAGNOSIS — I639 Cerebral infarction, unspecified: Secondary | ICD-10-CM | POA: Diagnosis present

## 2021-07-22 DIAGNOSIS — Z8249 Family history of ischemic heart disease and other diseases of the circulatory system: Secondary | ICD-10-CM

## 2021-07-22 DIAGNOSIS — I6389 Other cerebral infarction: Principal | ICD-10-CM | POA: Diagnosis present

## 2021-07-22 DIAGNOSIS — I2510 Atherosclerotic heart disease of native coronary artery without angina pectoris: Secondary | ICD-10-CM | POA: Diagnosis present

## 2021-07-22 DIAGNOSIS — E785 Hyperlipidemia, unspecified: Secondary | ICD-10-CM | POA: Diagnosis present

## 2021-07-22 DIAGNOSIS — I10 Essential (primary) hypertension: Secondary | ICD-10-CM | POA: Diagnosis present

## 2021-07-22 DIAGNOSIS — B182 Chronic viral hepatitis C: Secondary | ICD-10-CM | POA: Diagnosis not present

## 2021-07-22 DIAGNOSIS — Z87891 Personal history of nicotine dependence: Secondary | ICD-10-CM

## 2021-07-22 DIAGNOSIS — Z79899 Other long term (current) drug therapy: Secondary | ICD-10-CM

## 2021-07-22 DIAGNOSIS — F1010 Alcohol abuse, uncomplicated: Secondary | ICD-10-CM

## 2021-07-22 DIAGNOSIS — B20 Human immunodeficiency virus [HIV] disease: Secondary | ICD-10-CM | POA: Diagnosis present

## 2021-07-22 DIAGNOSIS — E1165 Type 2 diabetes mellitus with hyperglycemia: Secondary | ICD-10-CM | POA: Diagnosis present

## 2021-07-22 DIAGNOSIS — I69351 Hemiplegia and hemiparesis following cerebral infarction affecting right dominant side: Secondary | ICD-10-CM

## 2021-07-22 DIAGNOSIS — Z951 Presence of aortocoronary bypass graft: Secondary | ICD-10-CM

## 2021-07-22 DIAGNOSIS — Z794 Long term (current) use of insulin: Secondary | ICD-10-CM

## 2021-07-22 LAB — DIFFERENTIAL
Abs Immature Granulocytes: 0.01 10*3/uL (ref 0.00–0.07)
Basophils Absolute: 0 10*3/uL (ref 0.0–0.1)
Basophils Relative: 1 %
Eosinophils Absolute: 0.1 10*3/uL (ref 0.0–0.5)
Eosinophils Relative: 1 %
Immature Granulocytes: 0 %
Lymphocytes Relative: 35 %
Lymphs Abs: 2.6 10*3/uL (ref 0.7–4.0)
Monocytes Absolute: 0.6 10*3/uL (ref 0.1–1.0)
Monocytes Relative: 8 %
Neutro Abs: 4.1 10*3/uL (ref 1.7–7.7)
Neutrophils Relative %: 55 %

## 2021-07-22 LAB — I-STAT CHEM 8, ED
BUN: 19 mg/dL (ref 8–23)
Calcium, Ion: 1.17 mmol/L (ref 1.15–1.40)
Chloride: 105 mmol/L (ref 98–111)
Creatinine, Ser: 1 mg/dL (ref 0.44–1.00)
Glucose, Bld: 113 mg/dL — ABNORMAL HIGH (ref 70–99)
HCT: 35 % — ABNORMAL LOW (ref 36.0–46.0)
Hemoglobin: 11.9 g/dL — ABNORMAL LOW (ref 12.0–15.0)
Potassium: 3.6 mmol/L (ref 3.5–5.1)
Sodium: 142 mmol/L (ref 135–145)
TCO2: 25 mmol/L (ref 22–32)

## 2021-07-22 LAB — CBC
HCT: 36.8 % (ref 36.0–46.0)
Hemoglobin: 12.4 g/dL (ref 12.0–15.0)
MCH: 33.3 pg (ref 26.0–34.0)
MCHC: 33.7 g/dL (ref 30.0–36.0)
MCV: 98.9 fL (ref 80.0–100.0)
Platelets: 238 10*3/uL (ref 150–400)
RBC: 3.72 MIL/uL — ABNORMAL LOW (ref 3.87–5.11)
RDW: 13.1 % (ref 11.5–15.5)
WBC: 7.5 10*3/uL (ref 4.0–10.5)
nRBC: 0 % (ref 0.0–0.2)

## 2021-07-22 LAB — RESP PANEL BY RT-PCR (FLU A&B, COVID) ARPGX2
Influenza A by PCR: NEGATIVE
Influenza B by PCR: NEGATIVE
SARS Coronavirus 2 by RT PCR: NEGATIVE

## 2021-07-22 LAB — COMPREHENSIVE METABOLIC PANEL
ALT: 17 U/L (ref 0–44)
AST: 20 U/L (ref 15–41)
Albumin: 4.2 g/dL (ref 3.5–5.0)
Alkaline Phosphatase: 42 U/L (ref 38–126)
Anion gap: 11 (ref 5–15)
BUN: 18 mg/dL (ref 8–23)
CO2: 25 mmol/L (ref 22–32)
Calcium: 9.7 mg/dL (ref 8.9–10.3)
Chloride: 105 mmol/L (ref 98–111)
Creatinine, Ser: 1.15 mg/dL — ABNORMAL HIGH (ref 0.44–1.00)
GFR, Estimated: 53 mL/min — ABNORMAL LOW (ref >60)
Glucose, Bld: 113 mg/dL — ABNORMAL HIGH (ref 70–99)
Potassium: 3.6 mmol/L (ref 3.5–5.1)
Sodium: 141 mmol/L (ref 135–145)
Total Bilirubin: 0.6 mg/dL (ref 0.3–1.2)
Total Protein: 7.2 g/dL (ref 6.5–8.1)

## 2021-07-22 LAB — URINALYSIS, ROUTINE W REFLEX MICROSCOPIC
Bilirubin Urine: NEGATIVE
Glucose, UA: NEGATIVE mg/dL
Hgb urine dipstick: NEGATIVE
Ketones, ur: NEGATIVE mg/dL
Leukocytes,Ua: NEGATIVE
Nitrite: NEGATIVE
Protein, ur: NEGATIVE mg/dL
Specific Gravity, Urine: >1.046 — ABNORMAL HIGH (ref 1.005–1.030)
pH: 5 (ref 5.0–8.0)

## 2021-07-22 LAB — CBG MONITORING, ED: Glucose-Capillary: 105 mg/dL — ABNORMAL HIGH (ref 70–99)

## 2021-07-22 LAB — PROTIME-INR
INR: 1.1 (ref 0.8–1.2)
Prothrombin Time: 14.1 seconds (ref 11.4–15.2)

## 2021-07-22 LAB — RAPID URINE DRUG SCREEN, HOSP PERFORMED
Amphetamines: NOT DETECTED
Barbiturates: NOT DETECTED
Benzodiazepines: NOT DETECTED
Cocaine: NOT DETECTED
Opiates: NOT DETECTED
Tetrahydrocannabinol: NOT DETECTED

## 2021-07-22 LAB — APTT: aPTT: 32 seconds (ref 24–36)

## 2021-07-22 LAB — ETHANOL: Alcohol, Ethyl (B): <10 mg/dL (ref <10)

## 2021-07-22 MED ORDER — ASPIRIN EC 81 MG PO TBEC
81.0000 mg | DELAYED_RELEASE_TABLET | Freq: Every day | ORAL | Status: DC
  Administered 2021-07-23 – 2021-07-26 (×4): 81 mg via ORAL
  Filled 2021-07-22 (×4): qty 1

## 2021-07-22 MED ORDER — ATORVASTATIN CALCIUM 10 MG PO TABS
10.0000 mg | ORAL_TABLET | Freq: Every morning | ORAL | Status: DC
  Administered 2021-07-23 – 2021-07-26 (×4): 10 mg via ORAL
  Filled 2021-07-22 (×4): qty 1

## 2021-07-22 MED ORDER — STROKE: EARLY STAGES OF RECOVERY BOOK
Freq: Once | Status: AC
  Filled 2021-07-22: qty 1

## 2021-07-22 MED ORDER — INSULIN ASPART 100 UNIT/ML IJ SOLN
0.0000 [IU] | Freq: Three times a day (TID) | INTRAMUSCULAR | Status: DC
  Administered 2021-07-23: 3 [IU] via SUBCUTANEOUS
  Administered 2021-07-24 – 2021-07-26 (×7): 2 [IU] via SUBCUTANEOUS

## 2021-07-22 MED ORDER — ALBUTEROL SULFATE (2.5 MG/3ML) 0.083% IN NEBU: 3.0000 mL | INHALATION_SOLUTION | Freq: Three times a day (TID) | RESPIRATORY_TRACT | Status: DC | PRN

## 2021-07-22 MED ORDER — ACETAMINOPHEN 325 MG PO TABS: 650.0000 mg | ORAL_TABLET | ORAL | Status: DC | PRN

## 2021-07-22 MED ORDER — ASPIRIN 325 MG PO TABS
325.0000 mg | ORAL_TABLET | Freq: Once | ORAL | Status: AC
  Administered 2021-07-22: 325 mg via ORAL
  Filled 2021-07-22: qty 1

## 2021-07-22 MED ORDER — GABAPENTIN 300 MG PO CAPS
900.0000 mg | ORAL_CAPSULE | Freq: Every day | ORAL | Status: DC
  Administered 2021-07-22 – 2021-07-25 (×4): 900 mg via ORAL
  Filled 2021-07-22 (×4): qty 3

## 2021-07-22 MED ORDER — POLYETHYLENE GLYCOL 3350 17 G PO PACK: 17.0000 g | PACK | Freq: Every day | ORAL | Status: DC | PRN

## 2021-07-22 MED ORDER — DONEPEZIL HCL 10 MG PO TABS
5.0000 mg | ORAL_TABLET | Freq: Every day | ORAL | Status: DC
  Administered 2021-07-22 – 2021-07-25 (×4): 5 mg via ORAL
  Filled 2021-07-22 (×5): qty 1

## 2021-07-22 MED ORDER — CLOPIDOGREL BISULFATE 75 MG PO TABS
75.0000 mg | ORAL_TABLET | Freq: Every day | ORAL | Status: DC
  Administered 2021-07-23 – 2021-07-25 (×3): 75 mg via ORAL
  Filled 2021-07-22 (×3): qty 1

## 2021-07-22 MED ORDER — GABAPENTIN 100 MG PO CAPS: 100.0000 mg | ORAL_CAPSULE | Freq: Every day | ORAL | Status: DC

## 2021-07-22 MED ORDER — INSULIN GLARGINE-YFGN 100 UNIT/ML ~~LOC~~ SOLN
8.0000 [IU] | Freq: Every day | SUBCUTANEOUS | Status: DC
  Administered 2021-07-22 – 2021-07-25 (×3): 8 [IU] via SUBCUTANEOUS
  Filled 2021-07-22 (×5): qty 0.08

## 2021-07-22 MED ORDER — IOHEXOL 350 MG/ML SOLN
100.0000 mL | Freq: Once | INTRAVENOUS | Status: AC | PRN
  Administered 2021-07-22: 100 mL via INTRAVENOUS

## 2021-07-22 MED ORDER — ACETAMINOPHEN 160 MG/5ML PO SOLN: 650.0000 mg | ORAL | Status: DC | PRN

## 2021-07-22 MED ORDER — GABAPENTIN 300 MG PO CAPS: 900.0000 mg | ORAL_CAPSULE | Freq: Every day | ORAL | Status: DC

## 2021-07-22 MED ORDER — METOPROLOL SUCCINATE ER 25 MG PO TB24: 50.0000 mg | ORAL_TABLET | Freq: Every morning | ORAL | Status: DC

## 2021-07-22 MED ORDER — ACETAMINOPHEN 650 MG RE SUPP: 650.0000 mg | RECTAL | Status: DC | PRN

## 2021-07-22 MED ORDER — CALCIUM CARBONATE ANTACID 500 MG PO CHEW: 2.0000 | CHEWABLE_TABLET | Freq: Every day | ORAL | Status: DC | PRN

## 2021-07-22 MED ORDER — GABAPENTIN 800 MG PO TABS: 800.0000 mg | ORAL_TABLET | Freq: Every day | ORAL | Status: DC

## 2021-07-22 MED ORDER — BICTEGRAVIR-EMTRICITAB-TENOFOV 50-200-25 MG PO TABS
1.0000 | ORAL_TABLET | Freq: Every morning | ORAL | Status: DC
  Administered 2021-07-23 – 2021-07-26 (×4): 1 via ORAL
  Filled 2021-07-22 (×4): qty 1

## 2021-07-22 MED ORDER — FAMOTIDINE 20 MG PO TABS
20.0000 mg | ORAL_TABLET | Freq: Every morning | ORAL | Status: DC
  Administered 2021-07-23 – 2021-07-26 (×4): 20 mg via ORAL
  Filled 2021-07-22 (×4): qty 1

## 2021-07-22 MED ORDER — GABAPENTIN 300 MG PO CAPS
300.0000 mg | ORAL_CAPSULE | Freq: Every morning | ORAL | Status: DC
  Administered 2021-07-23 – 2021-07-26 (×4): 300 mg via ORAL
  Filled 2021-07-22 (×4): qty 1

## 2021-07-22 NOTE — ED Notes (Signed)
Patient transported to MRI 

## 2021-07-22 NOTE — ED Provider Notes (Addendum)
[facility] EMERGENCY DEPARTMENT Provider Note   CSN: [id number] Arrival date & time: 07/22/21  1455     History No chief complaint on file.   Lindsey Leon is a 65 y.o. female.  HPI   65 year old female with a hx of diabetes, hypertension, hepatitis C, HIV positive, pancreatitis, prior left temporal hemorrhagic stroke and multifocal multivessel stenosis with known chronic right common carotid artery stenosis who presents to the ED with concern for new stroke. Patient was last normal yesterday around noon. She states that she had previous right sided deficits from her old stroke but had been ambulatory was assistance from a walker. The patient now states that she cannot walk due to worsening right hemibody weakness. She is concerned she has had a new stroke. No facial droop, dysarthria, dysphagia.   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   HTN (hypertension) 01/01/2021   Routine screening for STI (sexually transmitted infection) 12/01/2019   Type 2 diabetes mellitus with hyperglycemia (HCC) 02/2019   Dementia (HCC) 12/27/2018   Human immunodeficiency virus (HIV) disease (HCC) 12/25/2018   Carotid artery occlusion 12/25/2018   CVA (cerebral vascular accident) (HCC) 12/25/2018   Chronic hepatitis C without hepatic coma (HCC) 12/2018   TIA (transient ischemic attack) 12/06/2018   ICH (intracerebral hemorrhage) (HCC) 05/23/2014   Diverticulitis of colon 02/24/2013   Pancreatitis, Hx of 02/24/2013   Alcohol abuse 02/24/2013    Past Surgical History:  Procedure Laterality Date   FLEXIBLE SIGMOIDOSCOPY N/A 02/26/2013   Procedure: FLEXIBLE  SIGMOIDOSCOPY;  Surgeon: Theda Belfast, MD;  Location: [facility] ENDOSCOPY;  Service: Endoscopy;  Laterality: N/A;     OB History   No obstetric history on file.     Family History  Problem Relation Age of Onset   Healthy Father    Cancer Mother        passed away. unknown age   Hypertension Sister    Diabetes Mellitus II Sister     Social History   Tobacco Use   Smoking status: Former    Packs/day: 0.50    Years: 30.00    Pack years: 15.00    Types: Cigarettes    Quit date: 05/22/2015    Years since quitting: 6.1   Smokeless tobacco: Never  Vaping Use   Vaping Use: Never used  Substance Use Topics   Alcohol use: No    Alcohol/week: 0.0 standard drinks   Drug use: Not Currently    Types: Marijuana    Home Medications Prior to Admission medications   Medication Sig Start Date End Date Taking? Authorizing Provider  acetaminophen (TYLENOL) 325 MG tablet Take 650 mg by mouth 2 (two) times daily as needed for moderate pain or mild pain.    [provider]  albuterol (VENTOLIN HFA) 108 (90 Base) MCG/ACT inhaler Inhale 2 puffs into the lungs 3 (three) times daily as needed for wheezing or shortness of breath.    [provider]  atorvastatin (LIPITOR) 10 MG tablet Take 1 tablet by mouth daily. 01/24/21 01/24/22  [provider]  B Complex Vitamins (VITAMIN B COMPLEX) TABS Take 1 tablet by mouth daily with breakfast.    [provider]  bictegravir-emtricitabine-tenofovir AF (BIKTARVY) 50-200-25 MG TABS tablet TAKE 1 TABLET BY MOUTH DAILY. 05/17/21 05/17/22  Gardiner Barefoot, MD  Calcium Carbonate Antacid (TUMS ULTRA 1000 PO) Take 2 tablets by mouth daily as needed (indigestion).    [provider]  chlorthalidone (HYGROTON) 25 MG tablet Take 25 mg by mouth daily.    [provider]  Cholecalciferol (VITAMIN D) 50 MCG (2000 UT) tablet Take 2,000 Units by mouth daily.    [provider]  clopidogrel (PLAVIX) 75 MG tablet Take 1  tablet (75 mg total) by mouth daily. 02/03/21   Sherryll Burger, Pratik D, DO  donepezil (ARICEPT) 10 MG tablet Take 1 tablet (10 mg total) by mouth at bedtime. Start half tablet daily for 4 weeks and then increase if tolerated without side effects to 1 tablet daily 07/06/21   [name], MD  famotidine (PEPCID) 20 MG tablet Take 20 mg by mouth daily.    [provider]  furosemide (LASIX) 20 MG tablet Take 20 mg by mouth.    [provider]  gabapentin (NEURONTIN) 100 MG capsule Take 100 mg by mouth at bedtime.    [provider]  gabapentin (NEURONTIN) 300 MG capsule Take 300 mg by mouth daily.    [provider]  gabapentin (NEURONTIN) 800 MG tablet Take 800 mg by mouth at bedtime. Take with a 100mg  capsule to equal 900mg  at bedtime.    [provider]  insulin glargine (LANTUS) 100 UNIT/ML injection Inject 13 Units into the skin at bedtime.    [provider]  losartan (COZAAR) 100 MG tablet Take 100 mg by mouth daily.    [provider]  Menthol, Topical Analgesic, (ICY HOT BACK EX) Apply topically.    [provider]  Menthol, Topical Analgesic, 5 % GEL Apply topically.    [provider]  metoprolol succinate (TOPROL-XL) 50 MG 24 hr tablet Take 50 mg by mouth daily. Take with or immediately following a meal.    [provider]  Omega-3 Fatty Acids (FISH OIL) 1000 MG CAPS Take by mouth.    [provider]  polyethylene glycol powder (GLYCOLAX/MIRALAX) 17 GM/SCOOP powder Take 17 g by mouth daily as needed for mild constipation or moderate constipation. Mix in 4-6 oz of liquid and take by mouth    [provider]  potassium chloride SA (KLOR-CON) 20 MEQ tablet Take by mouth. 05/17/21   [provider]  sennosides-docusate sodium (SENOKOT-S) 8.6-50 MG tablet Take 2 tablets by mouth at bedtime.    [provider]    Allergies    Patient has no known allergies.  Review of Systems    Review of Systems  Constitutional:  Negative for chills and fever.  HENT:  Negative for ear pain and sore throat.   Eyes:  Negative for pain and visual disturbance.  Respiratory:  Negative for cough and shortness of breath.   Cardiovascular:  Negative for chest pain and palpitations.  Gastrointestinal:  Negative for abdominal pain and vomiting.  Genitourinary:  Negative for dysuria and hematuria.  Musculoskeletal:  Positive for gait problem. Negative for arthralgias and back pain.  Skin:  Negative for color change and rash.  Neurological:  Positive for weakness. Negative for seizures, syncope, facial asymmetry, numbness and headaches.  All other systems reviewed and are negative.  Physical Exam Updated  Vital Signs There were no vitals taken for this visit.  Physical Exam Vitals and nursing note reviewed.  Constitutional:      General: She is not in acute distress.    Appearance: She is well-developed.  HENT:     Head: Normocephalic and atraumatic.  Eyes:     Conjunctiva/sclera: Conjunctivae normal.     Pupils: Pupils are equal, round, and reactive to light.  Cardiovascular:     Rate and Rhythm: Normal rate and regular rhythm.     Heart sounds: No murmur heard. Pulmonary:     Effort: Pulmonary effort is normal. No respiratory distress.     Breath sounds: Normal breath sounds.  Abdominal:     General: There is no distension.     Palpations: Abdomen is soft.     Tenderness: There is no abdominal tenderness. There is no guarding.  Musculoskeletal:        General: No deformity or signs of injury.     Cervical back: Normal range of motion and neck supple.  Skin:    General: Skin is warm and dry.     Findings: No lesion or rash.  Neurological:     Mental Status: She is alert.     Comments: MENTAL STATUS EXAM:    Orientation: Alert and oriented to person, place and time. Memory: Cooperative, follows commands well.  Language: Speech is clear and language is normal.    CRANIAL NERVES:    CN 2 (Optic): Visual fields intact to confrontation.  CN 3,4,6 (EOM): Pupils equal and reactive to light. Full extraocular eye movement without nystagmus.  CN 5 (Trigeminal): Facial sensation is normal, no weakness of masticatory muscles.  CN 7 (Facial): No facial weakness or asymmetry.  CN 8 (Auditory): Auditory acuity grossly normal.  CN 9,10 (Glossophar): The uvula is midline, the palate elevates symmetrically.  CN 11 (spinal access): Normal sternocleidomastoid and trapezius strength.  CN 12 (Hypoglossal): The tongue is midline. No atrophy or fasciculations.Marland Kitchen   MOTOR:  Muscle Strength: 4/5RUE, 5/5LUE, 3/5RLE, 5/5LLE.   COORDINATION:   Intact finger-to-nose, no tremor, no pronator drift.   SENSATION:   Intact to light touch all four extremities.  GAIT: Gait not assessed.     ED Results / Procedures / Treatments   Labs (all labs ordered are listed, but only abnormal results are displayed) Labs Reviewed - No data to display  EKG None  Radiology No results found.  Procedures Procedures   Medications Ordered in ED Medications - No data to display  ED Course  I have reviewed the triage vital signs and the nursing notes.  Pertinent labs & imaging results that were available during my care of the patient were reviewed by me and considered in my medical decision making (see chart for details).    MDM Rules/Calculators/A&P                           65 year old female with a hx of diabetes, hypertension, hepatitis C, HIV positive, pancreatitis, prior left temporal hemorrhagic stroke and multifocal multivessel stenosis with known chronic right common carotid artery stenosis who presents to the ED with concern for new stroke. Patient was last normal yesterday around noon. She states that she had previous right sided deficits from her old stroke but had been ambulatory with assistance from a walker. The patient now states that she cannot walk due to  worsening right hemibody weakness. She is concerned she has had  a new stroke. No facial droop, dysarthria, dysphagia.   On arrival, patient with worsening right hemibody deficit on exam compared to reported baseline. Outside the window for tPA or thrombectomy. Afebrile, HDS, reassuring vitals. Will obtain CT/CTA imaging to evaluate further. Screening labs obtained. Plan to follow-up CT imaging and consult neuro as needed. Signout given to Dr. Rodena Medin at [phone].  Final Clinical Impression(s) / ED Diagnoses Final diagnoses:  None    Rx / DC Orders ED Discharge Orders     None        Ernie Avena, MD 07/22/21 1916    Ernie Avena, MD 07/23/21 [phone]

## 2021-07-22 NOTE — ED Provider Notes (Signed)
Seen after prior EDP.  Patient with complaint of worsening right-sided weakness which is interfering with her ability to ambulate.  We will plan for admission for further work-up and treatment.  Neurology Otelia Limes) is aware of case and recommends MRI.  Neurology happy to officially consult if MRI shows acute pathology.  Medicine services aware of case and will  evaluate for admission.     Wynetta Fines, MD 07/22/21 2140

## 2021-07-22 NOTE — ED Triage Notes (Signed)
Pt came from [facility] after new right sided deficits that started yesterday at 1300. Pt has energy deficit, not feeling herself. She has hx of multiple TIA's and EMS reports right sided weakness and unable to bare weight. Staff reports baseline mild dementia

## 2021-07-22 NOTE — H&P (Signed)
History and Physical   Lindsey Leon SWH:[id number] DOB: Aug 19, 1956 DOA: 07/22/2021  PCP: Default, Provider, MD   Patient coming from: [facility]  Chief Complaint: Weakness  HPI: Lindsey Leon is a 65 y.o. female with medical history significant of alcohol use, carotid artery disease, chronic hep C, history of CVA, dementia, diverticulosis, hypertension, HIV, diabetes, CAD status post CABG presenting with focal neurologic deficit.  Patient has known history of prior stroke and significant vascular disease.  Previous stroke had some right-sided deficits which have improved from that stroke.  Noticed started having some right-sided weakness starting yesterday afternoon around 1 PM.  Now interfering with ability to walk and bear weight.  Also reports feeling tired.  Has a baseline mild dementia per staff reports at facility.  She denies fevers, chills, chest pain, shortness breath, dumping, constipation, diarrhea, nausea, vomiting.  ED Course: Vital signs in the ED were stable.  Lab work-up showed CMP with creatinine of 1.15 which is near baseline of 0.9-1.  CBC within normal limits.  PT, PTT, INR within normal limits.  Respiratory panel negative for flu or COVID.  Ethanol level normal, urinalysis normal, urine tox screen pending.  Chest x-ray showed no acute normality.  CT a of the head and neck showed negative for new large occlusion, chronic carotid artery disease, cerebrovascular disease, vertebral stenosis, upper lobe groundglass opacity favoring pulmonary edema.  Also noted was a parotid lesion with recommendation for ENT follow-up.  ED provider states he will consult neurology.  Review of Systems: As per HPI otherwise all other systems reviewed and are negative.  Past Medical History:  Diagnosis Date   Chronic hepatitis C (HCC)    dx'ed in 12/2018   Dementia [facility])    Diverticulitis    HIV infection (HCC)    dx'ed in 12/2018   Hypertension    not on medications    Pancreatitis    Stroke [facility])     Past Surgical History:  Procedure Laterality Date   FLEXIBLE SIGMOIDOSCOPY N/A 02/26/2013   Procedure: FLEXIBLE SIGMOIDOSCOPY;  Surgeon: Theda Belfast, MD;  Location: [facility] ENDOSCOPY;  Service: Endoscopy;  Laterality: N/A;    Social History  reports that she quit smoking about 6 years ago. Her smoking use included cigarettes. She has a 15.00 pack-year smoking history. She has never used smokeless tobacco. She reports that she does not currently use drugs after having used the following drugs: Marijuana. She reports that she does not drink alcohol.  No Known Allergies  Family History  Problem Relation Age of Onset   Healthy Father    Cancer Mother        passed away. unknown age   Hypertension Sister    Diabetes Mellitus II Sister   Reviewed on admission  Prior to Admission medications   Medication Sig Start Date End Date Taking? Authorizing Provider  acetaminophen (TYLENOL) 325 MG tablet Take 650 mg by mouth every 12 (twelve) hours as needed (pain).   Yes [provider]  albuterol (VENTOLIN HFA) 108 (90 Base) MCG/ACT inhaler Inhale 2 puffs into the lungs every 8 (eight) hours as needed for shortness of breath.   Yes [provider]  atorvastatin (LIPITOR) 10 MG tablet Take 10 mg by mouth every morning. 01/24/21 01/24/22 Yes [provider]  B Complex Vitamins (VITAMIN B COMPLEX) TABS Take 1 tablet by mouth every morning.   Yes [provider]  bictegravir-emtricitabine-tenofovir AF (BIKTARVY) 50-200-25 MG TABS tablet TAKE 1 TABLET BY MOUTH  DAILY. Patient taking differently: Take 1 tablet by mouth every morning. 05/17/21 05/17/22 Yes Comer, Belia Heman, MD  Calcium Carbonate Antacid (TUMS ULTRA 1000 PO) Take 2 tablets by mouth daily as needed (indigestion).   Yes [provider]  chlorthalidone (HYGROTON) 25 MG tablet Take 25 mg by mouth every morning.   Yes [provider]  Cholecalciferol (VITAMIN D) 50 MCG  (2000 UT) tablet Take 2,000 Units by mouth every morning.   Yes [provider]  clopidogrel (PLAVIX) 75 MG tablet Take 1 tablet (75 mg total) by mouth daily. 02/03/21  Yes Shah, Pratik D, DO  donepezil (ARICEPT) 5 MG tablet Take 5 mg by mouth at bedtime.   Yes [provider]  famotidine (PEPCID) 20 MG tablet Take 20 mg by mouth every morning.   Yes [provider]  furosemide (LASIX) 20 MG tablet Take 20 mg by mouth daily as needed (shortness of breath).   Yes [provider]  gabapentin (NEURONTIN) 100 MG capsule Take 100 mg by mouth at bedtime. Take with an 800mg  tablet to equal 900mg  at bedtime.   Yes [provider]  gabapentin (NEURONTIN) 300 MG capsule Take 300 mg by mouth every morning.   Yes [provider]  gabapentin (NEURONTIN) 800 MG tablet Take 800 mg by mouth at bedtime. Take with a 100mg  capsule to equal 900mg  at bedtime.   Yes [provider]  insulin glargine (LANTUS) 100 UNIT/ML injection Inject 13 Units into the skin at bedtime.   Yes [provider]  Lidocaine-Menthol (ICY HOT MAX) 4-1 % AERO Apply 1 application topically daily as needed (pain).   Yes [provider]  losartan (COZAAR) 100 MG tablet Take 100 mg by mouth daily at 12 noon.   Yes [provider]  Menthol, Topical Analgesic, 5 % GEL Apply 1 application topically every 12 (twelve) hours as needed (right foot and heel pain).   Yes [provider]  metoprolol succinate (TOPROL-XL) 50 MG 24 hr tablet Take 50 mg by mouth every morning. Take with or immediately following a meal.   Yes [provider]  Omega-3 Fatty Acids (FISH OIL) 1000 MG CAPS Take 1,000 mg by mouth every morning.   Yes [provider]  polyethylene glycol powder (GLYCOLAX/MIRALAX) 17 GM/SCOOP powder Take 17 g by mouth daily as needed (constipation).   Yes [provider]  potassium chloride SA (KLOR-CON) 20 MEQ tablet Take 20 mEq  by mouth every Monday, Wednesday, and Friday. 05/17/21  Yes [provider]  sennosides-docusate sodium (SENOKOT-S) 8.6-50 MG tablet Take 2 tablets by mouth at bedtime.   Yes [provider]  donepezil (ARICEPT) 10 MG tablet Take 1 tablet (10 mg total) by mouth at bedtime. Start half tablet daily for 4 weeks and then increase if tolerated without side effects to 1 tablet daily Patient not taking: Reported on 07/22/2021 07/06/21   07/17/21, MD    Physical Exam: Vitals:   07/22/21 1845 07/22/21 1900 07/22/21 1930 07/22/21 2000  BP: (!) 128/113 (!) 122/49 (!) 140/53 (!) 129/53  Pulse: 63 (!) 59 62 (!) 58  Resp: 16 11 13 15   Temp:      TempSrc:      SpO2: 98% 99% 99% 97%  Weight:      Height:       Physical Exam Constitutional:      General: She is not in acute distress.    Appearance: Normal appearance.  HENT:  Head: Normocephalic and atraumatic.     Mouth/Throat:     Mouth: Mucous membranes are moist.     Pharynx: Oropharynx is clear.  Eyes:     Extraocular Movements: Extraocular movements intact.     Pupils: Pupils are equal, round, and reactive to light.  Cardiovascular:     Rate and Rhythm: Normal rate and regular rhythm.     Pulses: Normal pulses.     Heart sounds: Normal heart sounds.  Pulmonary:     Effort: Pulmonary effort is normal. No respiratory distress.     Breath sounds: Normal breath sounds.  Abdominal:     General: Bowel sounds are normal. There is no distension.     Palpations: Abdomen is soft.     Tenderness: There is no abdominal tenderness.  Musculoskeletal:        General: No swelling or deformity.  Skin:    General: Skin is warm and dry.  Neurological:     Comments: Mental Status: Patient is awake, alert, oriented to person and place, responed with "1922 to year instead of 2022 No signs of aphasia or neglect Cranial Nerves: II: Pupils equal, round, and reactive to light.   III,IV, VI: EOMI without ptosis or diploplia.  V:  Facial sensation is symmetric to light touch. VII: Facial movement is symmetric.  VIII: hearing is intact to voice X: Uvula elevates symmetrically XI: Shoulder shrug is symmetric. XII: tongue is midline without atrophy or fasciculations.  Motor: Good effort thorughout, at Least 4/5 RLE, 4/5 RUE, 5/5 LUE and LLE  Sensory: Sensation is grossly intact  bilateral UEs & LEs Cerebellar: Finger-Nose intact bilat, slower on right     Labs on Admission: I have personally reviewed following labs and imaging studies  CBC: Recent Labs  Lab 07/22/21 1656 07/22/21 1718  WBC 7.5  --   NEUTROABS 4.1  --   HGB 12.4 11.9*  HCT 36.8 35.0*  MCV 98.9  --   PLT 238  --     Basic Metabolic Panel: Recent Labs  Lab 07/22/21 1656 07/22/21 1718  NA 141 142  K 3.6 3.6  CL 105 105  CO2 25  --   GLUCOSE 113* 113*  BUN 18 19  CREATININE 1.15* 1.00  CALCIUM 9.7  --     GFR: Estimated Creatinine Clearance: 58.8 mL/min (by C-G formula based on SCr of 1 mg/dL).  Liver Function Tests: Recent Labs  Lab 07/22/21 1656  AST 20  ALT 17  ALKPHOS 42  BILITOT 0.6  PROT 7.2  ALBUMIN 4.2    Urine analysis:    Component Value Date/Time   COLORURINE YELLOW 07/22/2021 1530   APPEARANCEUR HAZY (A) 07/22/2021 1530   LABSPEC >1.046 (H) 07/22/2021 1530   PHURINE 5.0 07/22/2021 1530   GLUCOSEU NEGATIVE 07/22/2021 1530   HGBUR NEGATIVE 07/22/2021 1530   BILIRUBINUR NEGATIVE 07/22/2021 1530   KETONESUR NEGATIVE 07/22/2021 1530   PROTEINUR NEGATIVE 07/22/2021 1530   UROBILINOGEN 0.2 08/11/2014 0046   NITRITE NEGATIVE 07/22/2021 1530   LEUKOCYTESUR NEGATIVE 07/22/2021 1530    Radiological Exams on Admission: CT ANGIO HEAD NECK W WO CM  Result Date: 07/22/2021 CLINICAL DATA:  Initial evaluation for neuro deficit, stroke suspected. EXAM: CT ANGIOGRAPHY HEAD AND NECK TECHNIQUE: Multidetector CT imaging of the head and neck was performed using the standard protocol during bolus administration of  intravenous contrast. Multiplanar CT image reconstructions and MIPs were obtained to evaluate the vascular anatomy. Carotid stenosis measurements (when applicable) are obtained utilizing  NASCET criteria, using the distal internal carotid diameter as the denominator. CONTRAST:  OMNIPAQUE IOHEXOL 350 MG/ML SOLN COMPARISON:  CT from 02/11/2021. FINDINGS: CT HEAD FINDINGS Brain: Moderately advanced cerebral atrophy with chronic small vessel ischemic disease. Multiple scattered remote lacunar infarcts present about the bilateral basal ganglia and thalami. Superimposed mineralization at the deep gray nuclei. No acute intracranial hemorrhage. No acute large vessel territory infarct. No mass lesion or midline shift. Ventricular prominence related to global parenchymal volume loss of hydrocephalus. No extra-axial fluid collection. Vascular: No hyperdense vessel. Calcified atherosclerosis present at the skull base. Skull: Scalp soft tissues within normal limits.  Calvarium intact. Sinuses: Visualized paranasal sinuses and mastoid air cells are clear. Orbits: Globes and orbital soft tissues demonstrate no acute finding. Review of the MIP images confirms the above findings CTA NECK FINDINGS Aortic arch: Visualized aortic arch normal in caliber with normal branch pattern. Moderate to advanced atheromatous change about the arch and origin of the great vessels without high-grade stenosis. Right carotid system: Right common carotid artery occluded at its origin. Distal reconstitution at the right carotid bifurcation via collateral flow from the right external carotid artery. Bulky calcified plaque at the right carotid bulb and origin of the right ICA with severe near occlusive stenosis (series 9, image 228). Right ICA attenuated but patent distally to the skull base. Left carotid system: Scattered atheromatous plaque throughout the left CCA without high-grade stenosis. Eccentric calcified plaque at the proximal left ICA with  associated stenosis of up to approximately 50% by NASCET criteria. Left ICA tortuous but patent distally without stenosis. Vertebral arteries: Both vertebral arteries arise from subclavian arteries. 50% stenosis noted involving the proximal left subclavian artery prior to the takeoff of the left vertebral artery. Proximal right subclavian artery not well assessed due to adjacent venous contamination. Atheromatous change at the origins of both vertebral arteries with severe ostial stenoses. Additional atheromatous irregularity within the proximal left V1 and V2 segments with associated mild to moderate multifocal narrowing. Vertebral arteries otherwise patent to the skull base without stenosis or other acute abnormality. Skeleton: No discrete or worrisome osseous lesions. Moderate spondylosis at C5-6 and C6-7. Median sternotomy wires noted. Other neck: No other acute soft tissue abnormality within the neck. 1.7 cm soft tissue lesion present at the inferior right parotid gland (series 10, image 114), indeterminate. Upper chest: Scattered ground-glass opacity seen within the partially visualized lungs, favored to reflect pulmonary edema, although multifocal infection could be considered. Review of the MIP images confirms the above findings CTA HEAD FINDINGS Anterior circulation: /petrous segments patent bilaterally. Atheromatous change throughout the carotid siphons with associated severe stenoses at the supraclinoid segments bilaterally. A1 segments patent bilaterally. Right A1 slightly hypoplastic. Normal anterior communicating artery complex. Anterior cerebral arteries patent to their distal aspects without stenosis. No M1 stenosis or occlusion. No proximal MCA branch occlusion. Distal MCA branches perfused and grossly symmetric. Posterior circulation: Both V4 segments patent to the vertebrobasilar junction without stenosis. Neither PICA origin well visualized on this exam. Basilar patent to its distal aspect  without stenosis. Superior cerebral arteries patent bilaterally. Both PCAs primarily supplied via the basilar. Atheromatous change within the PCAs bilaterally, left greater than right, with associated moderate bilateral P2 stenoses. PCAs remain patent to their distal aspects. Venous sinuses: Not well assessed due to timing the contrast bolus. Anatomic variants: None significant. Review of the MIP images confirms the above findings IMPRESSION: 1. Negative CTA for emergent large vessel occlusion. 2. Chronic occlusion of the right common  carotid artery at its origin with distal reconstitution at the right carotid bifurcation. Additional severe calcified stenosis at the right bifurcation. 3. Atheromatous change throughout the carotid siphons with associated severe bilateral supraclinoid stenoses. 4. Severe ostial stenoses at the origins of both vertebral arteries. 5. 1.7 cm soft tissue lesion at the inferior right parotid gland, indeterminate. Outpatient ENT referral for further workup and consultation suggested. 6. Scattered ground-glass opacity within the partially visualized lungs, favored to reflect pulmonary edema, although multifocal infection could be considered in the correct clinical setting. Electronically Signed   By: Rise Mu M.D.   On: 07/22/2021 19:38   DG Chest Port 1 View  Result Date: 07/22/2021 CLINICAL DATA:  Weakness EXAM: PORTABLE CHEST 1 VIEW COMPARISON:  Portable exam at 2007 hrs compared to 02/11/2021 FINDINGS: Normal heart size post median sternotomy and CABG. Atherosclerotic calcification aorta. Mediastinal contours and pulmonary vascularity normal. Lungs clear. No infiltrate, pleural effusion, or pneumothorax. Bones demineralized. IMPRESSION: Post CABG. No acute abnormalities. Aortic Atherosclerosis (ICD10-I70.0). Electronically Signed   By: Ulyses Southward M.D.   On: 07/22/2021 20:14    EKG: Independently reviewed.  Sinus rhythm at 60 bpm.  QTC prolonged at  507.  Assessment/Plan Principal Problem:   Acute focal neurological deficit Active Problems:   Human immunodeficiency virus (HIV) disease (HCC)   Carotid artery occlusion   CVA (cerebral vascular accident) (HCC)   Dementia (HCC)   Chronic hepatitis C without hepatic coma (HCC)   Type 2 diabetes mellitus with hyperglycemia (HCC)   HTN (hypertension)  Focal neurologic deficit History of CVA Carotid stenosis > History of prior CVA and known vascular disease with carotid artery disease.  Presenting with right-sided weakness similar to previous stroke.  No other evidence of exacerbation that could indicate unmasking of prior symptoms. > CTA head and neck in the ED showed no new large occlusion to showed known carotid disease as well as vertebral stenosis and cerebrovascular atherosclerosis. > No low blood pressures to indicate decrease in perfusion at this time.  Does have a lower heart rate in the 50s to 60s this may have contributed to her being more prone to additional ischemic insults considering her vascular disease. > ED provider agrees to consult neurology. - Neurology consult - Allow for permissive HTN (systolic < 220 and diastolic < 120) - ASA  - Statin  - Continue home Plavix - Echocardiogram  - MR brain - A1C  - Lipid panel  - Tele monitoring  - SLP eval - PT/OT  HIV - Continue home Biktarvy  Diabetes > On 13 units Lantus nightly at facility - Lantus 8 units nightly, SSI  Dementia - Continue home donepezil  Hypertension - Holding home antihypertensives in the setting of permissive hypertension  CAD status post CABG - Continuing with home Plavix - ASA as above - Holding antihypertensives as above  Chronic hepatitis C - LFTs stable  Prolonged QTC - Avoid QTC prolonging medications - EKG in a.m.  Parotid lesion > Incidental parotid lesion finding on CT head. - Recommendation for ENT follow-up on discharge.  DVT prophylaxis: SCDs, while awaiting MRI to  evaluate possible stroke and signs of possible stroke  Code Status:   Full  Family Communication:  Sister updated by phone. She states he has Parkinson's and will be unable to come in to visit her sister. Disposition Plan:   Patient is from:  [place] [facility]  Anticipated DC to:  Same as above  Anticipated DC date:  1 to 2 days  Anticipated DC barriers: None  Consults called:  None, ED provider has agreed to consult neurology, no note at the time of admission.   Admission status:  Observation, telemetry   Severity of Illness: The appropriate patient status for this patient is OBSERVATION. Observation status is judged to be reasonable and necessary in order to provide the required intensity of service to ensure the patient's safety. The patient's presenting symptoms, physical exam findings, and initial radiographic and laboratory data in the context of their medical condition is felt to place them at decreased risk for further clinical deterioration. Furthermore, it is anticipated that the patient will be medically stable for discharge from the hospital within 2 midnights of admission. The following factors support the patient status of observation.   " The patient's presenting symptoms include acute right-sided deficits. " The physical exam findings include right-sided weakness. " The initial radiographic and laboratory data are Lab work-up showed CMP with creatinine of 1.15 which is near baseline of 0.9-1.  CBC within normal limits.  PT, PTT, INR within normal limits.  Respiratory panel negative for flu or COVID.  Ethanol level normal, urinalysis normal, urine tox screen pending.  Chest x-ray showed no acute normality.  CT a of the head and neck showed negative for new large occlusion, chronic carotid artery disease, cerebrovascular disease, vertebral stenosis, upper lobe groundglass opacity favoring pulmonary edema.   Synetta Fail MD [facility]  How to contact the [facility]  Attending or Consulting provider 7A - 7P or covering provider during after hours 7P -7A, for this patient?   Check the care team in [facility] and look for a) attending/consulting [facility] provider listed and b) the [facility] team listed Log into [email or web address] and use [facility]'s universal password to access. If you do not have the password, please contact the hospital operator. Locate the [facility] provider you are looking for under [facility] and page to a number that you can be directly reached. If you still have difficulty reaching the provider, please page the [facility] (Director on Call) for the Hospitalists listed on amion for assistance.  07/22/2021, 9:24 PM

## 2021-07-22 NOTE — ED Notes (Signed)
Patient transported to CT 

## 2021-07-23 ENCOUNTER — Other Ambulatory Visit (HOSPITAL_COMMUNITY): Payer: Medicare Other

## 2021-07-23 ENCOUNTER — Observation Stay (HOSPITAL_COMMUNITY): Payer: Medicare Other

## 2021-07-23 DIAGNOSIS — I6389 Other cerebral infarction: Secondary | ICD-10-CM

## 2021-07-23 DIAGNOSIS — I639 Cerebral infarction, unspecified: Secondary | ICD-10-CM | POA: Diagnosis present

## 2021-07-23 DIAGNOSIS — Z951 Presence of aortocoronary bypass graft: Secondary | ICD-10-CM | POA: Diagnosis not present

## 2021-07-23 DIAGNOSIS — I6302 Cerebral infarction due to thrombosis of basilar artery: Secondary | ICD-10-CM | POA: Diagnosis not present

## 2021-07-23 DIAGNOSIS — R29702 NIHSS score 2: Secondary | ICD-10-CM | POA: Diagnosis present

## 2021-07-23 DIAGNOSIS — Z79899 Other long term (current) drug therapy: Secondary | ICD-10-CM | POA: Diagnosis not present

## 2021-07-23 DIAGNOSIS — R29818 Other symptoms and signs involving the nervous system: Secondary | ICD-10-CM | POA: Diagnosis not present

## 2021-07-23 DIAGNOSIS — Z794 Long term (current) use of insulin: Secondary | ICD-10-CM | POA: Diagnosis not present

## 2021-07-23 DIAGNOSIS — F0390 Unspecified dementia without behavioral disturbance: Secondary | ICD-10-CM | POA: Diagnosis present

## 2021-07-23 DIAGNOSIS — R531 Weakness: Secondary | ICD-10-CM | POA: Diagnosis present

## 2021-07-23 DIAGNOSIS — B20 Human immunodeficiency virus [HIV] disease: Secondary | ICD-10-CM | POA: Diagnosis present

## 2021-07-23 DIAGNOSIS — I2510 Atherosclerotic heart disease of native coronary artery without angina pectoris: Secondary | ICD-10-CM | POA: Diagnosis present

## 2021-07-23 DIAGNOSIS — I672 Cerebral atherosclerosis: Secondary | ICD-10-CM | POA: Diagnosis not present

## 2021-07-23 DIAGNOSIS — I6521 Occlusion and stenosis of right carotid artery: Secondary | ICD-10-CM | POA: Diagnosis not present

## 2021-07-23 DIAGNOSIS — Z87891 Personal history of nicotine dependence: Secondary | ICD-10-CM | POA: Diagnosis not present

## 2021-07-23 DIAGNOSIS — I6523 Occlusion and stenosis of bilateral carotid arteries: Secondary | ICD-10-CM | POA: Diagnosis present

## 2021-07-23 DIAGNOSIS — E1165 Type 2 diabetes mellitus with hyperglycemia: Secondary | ICD-10-CM | POA: Diagnosis present

## 2021-07-23 DIAGNOSIS — E785 Hyperlipidemia, unspecified: Secondary | ICD-10-CM | POA: Diagnosis present

## 2021-07-23 DIAGNOSIS — Z7902 Long term (current) use of antithrombotics/antiplatelets: Secondary | ICD-10-CM | POA: Diagnosis not present

## 2021-07-23 DIAGNOSIS — K118 Other diseases of salivary glands: Secondary | ICD-10-CM | POA: Diagnosis present

## 2021-07-23 DIAGNOSIS — Z833 Family history of diabetes mellitus: Secondary | ICD-10-CM | POA: Diagnosis not present

## 2021-07-23 DIAGNOSIS — Z8249 Family history of ischemic heart disease and other diseases of the circulatory system: Secondary | ICD-10-CM | POA: Diagnosis not present

## 2021-07-23 DIAGNOSIS — G8191 Hemiplegia, unspecified affecting right dominant side: Secondary | ICD-10-CM | POA: Diagnosis present

## 2021-07-23 DIAGNOSIS — B182 Chronic viral hepatitis C: Secondary | ICD-10-CM | POA: Diagnosis present

## 2021-07-23 DIAGNOSIS — Z20822 Contact with and (suspected) exposure to covid-19: Secondary | ICD-10-CM | POA: Diagnosis present

## 2021-07-23 DIAGNOSIS — I69351 Hemiplegia and hemiparesis following cerebral infarction affecting right dominant side: Secondary | ICD-10-CM | POA: Diagnosis not present

## 2021-07-23 DIAGNOSIS — I10 Essential (primary) hypertension: Secondary | ICD-10-CM | POA: Diagnosis present

## 2021-07-23 LAB — ECHOCARDIOGRAM COMPLETE
AR max vel: 2.4 cm2
AV Area VTI: 2.49 cm2
AV Area mean vel: 2.22 cm2
AV Mean grad: 8.5 mmHg
AV Peak grad: 16.4 mmHg
Ao pk vel: 2.03 m/s
Area-P 1/2: 2.87 cm2
Height: 66 in
MV M vel: 5.81 m/s
MV Peak grad: 135 mmHg
P 1/2 time: 523 msec
S' Lateral: 2.9 cm
Weight: 2720 oz

## 2021-07-23 LAB — LIPID PANEL
Cholesterol: 103 mg/dL (ref 0–200)
HDL: 33 mg/dL — ABNORMAL LOW (ref >40)
LDL Cholesterol: 39 mg/dL (ref 0–99)
Total CHOL/HDL Ratio: 3.1 RATIO
Triglycerides: 153 mg/dL — ABNORMAL HIGH (ref <150)
VLDL: 31 mg/dL (ref 0–40)

## 2021-07-23 LAB — CBG MONITORING, ED
Glucose-Capillary: 171 mg/dL — ABNORMAL HIGH (ref 70–99)
Glucose-Capillary: 111 mg/dL — ABNORMAL HIGH (ref 70–99)
Glucose-Capillary: 104 mg/dL — ABNORMAL HIGH (ref 70–99)

## 2021-07-23 LAB — HEMOGLOBIN A1C
Hgb A1c MFr Bld: 6.3 % — ABNORMAL HIGH (ref 4.8–5.6)
Mean Plasma Glucose: 134.11 mg/dL

## 2021-07-23 LAB — GLUCOSE, CAPILLARY: Glucose-Capillary: 103 mg/dL — ABNORMAL HIGH (ref 70–99)

## 2021-07-23 NOTE — Evaluation (Signed)
Speech Language Pathology Evaluation Patient Details Name: Lindsey Leon MRN: [id number] DOB: January 04, 1956 Today's Date: 07/23/2021 Time: 2620-3559 SLP Time Calculation (min) (ACUTE ONLY): 30 min  Problem List:  Patient Active Problem List   Diagnosis Date Noted   Acute focal neurological deficit 07/22/2021   Diplopia 01/01/2021   Cranial nerve III palsy, 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   HTN (hypertension) 01/01/2021   Routine screening for STI (sexually transmitted infection) 12/01/2019   Type 2 diabetes mellitus with hyperglycemia (HCC) 02/2019   Dementia (HCC) 12/27/2018   Human immunodeficiency virus (HIV) disease (HCC) 12/25/2018   Carotid artery occlusion 12/25/2018   CVA (cerebral vascular accident) (HCC) 12/25/2018   Chronic hepatitis C without hepatic coma (HCC) 12/2018   TIA (transient ischemic attack) 12/06/2018   ICH (intracerebral hemorrhage) (HCC) 05/23/2014   Diverticulitis of colon 02/24/2013   Pancreatitis, Hx of 02/24/2013   Alcohol abuse 02/24/2013   Past Medical History:  Past Medical History:  Diagnosis Date   Chronic hepatitis C (HCC)    dx'ed in 12/2018   Dementia (HCC)    Diverticulitis    HIV infection (HCC)    dx'ed in 12/2018   Hypertension    not on medications   Pancreatitis    Stroke [facility])    Past Surgical History:  Past Surgical History:  Procedure Laterality Date   FLEXIBLE SIGMOIDOSCOPY N/A 02/26/2013   Procedure: FLEXIBLE SIGMOIDOSCOPY;  Surgeon: Theda Belfast, MD;  Location: [facility] ENDOSCOPY;  Service: Endoscopy;  Laterality: N/A;   HPI:  Lindsey Leon is a 65 y.o. female with medical history significant of alcohol use, carotid artery disease, chronic hep C, history of CVA, dementia, diverticulosis, hypertension, HIV, diabetes, CAD status post CABG presenting with focal neurologic deficit.    Patient has known history of prior stroke and significant vascular disease.  Previous stroke had some right-sided deficits which have improved from that stroke.   Noticed started having some right-sided weakness starting yesterday afternoon around 1 PM.  Now interfering with ability to walk and bear weight.  Also reports feeling tired.  Has a baseline mild dementia per staff reports at facility; 07/22/21 MRI head revealed 1 cm acute ischemic nonhemorrhagic periventricular white matter  infarct involving the posterior left frontal corona radiata/centrum  semi ovale.  2. Additional 6 mm focus of mild diffusion abnormality involving the  right splenium, likely a focus of evolving subacute ischemia. No  associated hemorrhage or mass effect.  3. Underlying advanced chronic microvascular ischemic disease with  multiple remote lacunar infarcts involving the hemispheric cerebral  white matter, basal ganglia, thalami, and pons; passed [facility] swallow screen 07/22/21; SLE generated   Assessment / Plan / Recommendation Clinical Impression  Pt administered the SLUMS ([employer] [employer] Mental Status Examination) with a score of 13/26 obtained d/t eliminating portion of writing assessment subtest d/t dominant hand affected during recent CVA.  Pt with limited awareness of deficits and exhibited emotional lability intermittently during assessment, but was easily redirected.  SLUMS indicated deficits in the areas of memory, attention and orientation. Pt was able to recall objects after a time delay with categorization cue, but could not recall any items without min verbal cueing provided by SLP. Pt noted to have mild dementia per facility report prior to this hospitalization, but this appears to have been exacerbated by recent CVA with further cog/linguistic deficits identified as listed above.  Pt with decreased convergent/divergent naming ability observed during session as well with assistance/cueing required for simple naming tasks, but confrontational naming appeared  within functional limits for simple environmental objects.  Oriented x3.  Pt scored 100% with paragraph retention  questions.  ST will continue to f/u while in acute setting for cognitive/linguistic deficits.  Thank you for this consult.    SLP Assessment  SLP Recommendation/Assessment: Patient needs continued Speech Language Pathology Services SLP Visit Diagnosis: Attention and concentration deficit;Cognitive communication deficit (R41.841) Attention and concentration deficit following: Cerebral infarction    Recommendations for follow up therapy are one component of a multi-disciplinary discharge planning process, led by the attending physician.  Recommendations may be updated based on patient status, additional functional criteria and insurance authorization.    Follow Up Recommendations  Skilled Nursing facility    Frequency and Duration min 2x/week  1 week      SLP Evaluation Cognition  Overall Cognitive Status: No family/caregiver present to determine baseline cognitive functioning Arousal/Alertness: Awake/alert Orientation Level: Oriented to person;Oriented to place;Oriented to situation;Disoriented to time Year: Other (Comment) (2012) Day of Week: Incorrect Attention: Sustained Sustained Attention: Impaired Sustained Attention Impairment: Verbal basic;Functional basic Memory: Impaired Memory Impairment: Retrieval deficit;Decreased short term memory;Decreased recall of new information Decreased Short Term Memory: Verbal basic;Functional basic Immediate Memory Recall: Sock;Blue;Bed Memory Recall Sock: Without Cue Memory Recall Blue: With Cue Memory Recall Bed: With Cue Awareness: Appears intact Behaviors: Lability       Comprehension  Auditory Comprehension Overall Auditory Comprehension: Appears within functional limits for tasks assessed Yes/No Questions: Within Functional Limits Commands: Within Functional Limits Conversation: Simple Interfering Components: Attention;Working Radio broadcast assistant: Sports administrator: Not tested Reading Comprehension Reading Status: Not tested    Expression Expression Primary Mode of Expression: Verbal Verbal Expression Overall Verbal Expression: Impaired Level of Generative/Spontaneous Verbalization: Conversation Repetition: No impairment Naming: Impairment Responsive: 76-100% accurate Confrontation: Within functional limits Convergent: 25-49% accurate Divergent: 25-49% accurate Verbal Errors: Perseveration Non-Verbal Means of Communication: Not applicable Written Expression Dominant Hand: Right Written Expression: Not tested (dominant hand affected for graphic expression)   Oral / Motor  Oral Motor/Sensory Function Overall Oral Motor/Sensory Function: Mild impairment Facial Symmetry: Abnormal symmetry right (slight at rest) Lingual Symmetry: Abnormal symmetry right (slight deviation) Motor Speech Overall Motor Speech: Appears within functional limits for tasks assessed Respiration: Within functional limits Phonation: Low vocal intensity Resonance: Within functional limits Articulation: Within functional limitis Intelligibility: Intelligible Motor Planning: Witnin functional limits Motor Speech Errors: Not applicable                      Tressie Stalker, M.S., CCC-SLP 07/23/2021, 1:28 PM

## 2021-07-23 NOTE — Progress Notes (Signed)
PROGRESS NOTE    Lindsey Leon  CVE:[id number] DOB: 10-13-1956 DOA: 07/22/2021 PCP: Karna Dupes, MD  Outpatient Specialists:   Brief Narrative:  Patient is a 65 year old African-American female past medical history significant for prior CVA, significant alcohol use, carotid artery disease, chronic hep C, dementia, diverticulosis, hypertension, HIV, diabetes mellitus, coronary artery disease status post CABG.  Patient was admitted with history of right-sided weakness and, concerns for possible recurrent CVA.  Patient is not a particularly good historian.  Work-up done so far is nonrevealing.  Awaiting neurology input.  Patient is on Plavix 75 Mg p.o. once daily and aspirin 81 Mg p.o. once daily.     Assessment & Plan:   Principal Problem:   Acute focal neurological deficit Active Problems:   Human immunodeficiency virus (HIV) disease (HCC)   Carotid artery occlusion   CVA (cerebral vascular accident) (HCC)   Dementia (HCC)   Chronic hepatitis C without hepatic coma (HCC)   Type 2 diabetes mellitus with hyperglycemia (HCC)   HTN (hypertension)  Focal neurologic deficit History of CVA Carotid stenosis > History of prior CVA and known vascular disease with carotid artery disease.  Presenting with right-sided weakness similar to previous stroke.  No other evidence of exacerbation that could indicate unmasking of prior symptoms. > CTA head and neck in the ED showed no new large occlusion to showed known carotid disease as well as vertebral stenosis and cerebrovascular atherosclerosis. > No low blood pressures to indicate decrease in perfusion at this time.  Does have a lower heart rate in the 50s to 60s this may have contributed to her being more prone to additional ischemic insults considering her vascular disease. > Awaiting neurology input. -Continue to allow for permissive HTN (systolic < 220 and diastolic < 120) - Statin  -Patient is currently on aspirin and Plavix -  Echocardiogram result is pending. - MR brain revealed: 1. 1 cm acute ischemic nonhemorrhagic periventricular white matter infarct involving the posterior left frontal corona radiata/centrum semi ovale. 2. Additional 6 mm focus of mild diffusion abnormality involving the right splenium, likely a focus of evolving subacute ischemia. No associated hemorrhage or mass effect. 3. Underlying advanced chronic microvascular ischemic disease with multiple remote lacunar infarcts involving the hemispheric cerebral white matter, basal ganglia, thalami, and pons.   - A1C was 6.3% - Lipid panel revealed LDL of 39, total cholesterol of 103, HDL of 33 and triglyceride of 153.  Continue statins. - Tele monitoring  - SLP eval - PT/OT input is highly appreciated.  Skilled nursing facility recommended.   HIV - Continue home Biktarvy  Diabetes > On 13 units Lantus nightly at facility - Lantus 8 units nightly, SSI   Dementia - Continue home donepezil  Hypertension - Holding home antihypertensives in the setting of permissive hypertension  CAD status post CABG - Continuing with home Plavix - ASA as above - Holding antihypertensives as above   Chronic hepatitis C - LFTs stable   Prolonged QTC - Avoid QTC prolonging medications - EKG in a.m.   Parotid lesion > Incidental parotid lesion finding on CT head. - Recommendation for ENT follow-up on discharge.     DVT prophylaxis: SCD. Code Status: Full code Family Communication:  Disposition Plan: Likely skilled nursing facility   Consultants:  Awaiting neurology input  Procedures:  Echo result is pending  Antimicrobials:  None   Subjective: Poor historian. Reports right lower extremity cramp.  Objective: Vitals:   07/23/21 1121 07/23/21 1300 07/23/21 1500 07/23/21  1602  BP: (!) 139/49 136/60 (!) 121/52 (!) 150/55  Pulse: (!) 59 70 62 (!) 59  Resp: 12 20 13 12   Temp:      TempSrc:      SpO2: 97% 98% 97% 99%  Weight:       Height:       No intake or output data in the 24 hours ending 07/23/21 1622 Filed Weights   07/22/21 1518  Weight: 77.1 kg    Examination:  General exam: Appears calm and comfortable.  Patient is not in any obvious distress. Respiratory system: Clear to auscultation.  Cardiovascular system: S1 & S2  Gastrointestinal system: Abdomen is nondistended, soft and nontender. No organomegaly or masses felt. Normal bowel sounds heard. Central nervous system: Awake and alert.  Patient moves all extremities.  Patient will not comply with detailed physical examination.   Extremities: No leg edema  Data Reviewed: I have personally reviewed following labs and imaging studies  CBC: Recent Labs  Lab 07/22/21 1656 07/22/21 1718  WBC 7.5  --   NEUTROABS 4.1  --   HGB 12.4 11.9*  HCT 36.8 35.0*  MCV 98.9  --   PLT 238  --    Basic Metabolic Panel: Recent Labs  Lab 07/22/21 1656 07/22/21 1718  NA 141 142  K 3.6 3.6  CL 105 105  CO2 25  --   GLUCOSE 113* 113*  BUN 18 19  CREATININE 1.15* 1.00  CALCIUM 9.7  --    GFR: Estimated Creatinine Clearance: 58.8 mL/min (by C-G formula based on SCr of 1 mg/dL). Liver Function Tests: Recent Labs  Lab 07/22/21 1656  AST 20  ALT 17  ALKPHOS 42  BILITOT 0.6  PROT 7.2  ALBUMIN 4.2   No results for input(s): LIPASE, AMYLASE in the last 168 hours. No results for input(s): AMMONIA in the last 168 hours. Coagulation Profile: Recent Labs  Lab 07/22/21 1656  INR 1.1   Cardiac Enzymes: No results for input(s): CKTOTAL, CKMB, CKMBINDEX, TROPONINI in the last 168 hours. BNP (last 3 results) No results for input(s): PROBNP in the last 8760 hours. HbA1C: Recent Labs    07/23/21 0336  HGBA1C 6.3*   CBG: Recent Labs  Lab 07/22/21 2152 07/23/21 0939 07/23/21 1210  GLUCAP 105* 171* 111*   Lipid Profile: Recent Labs    07/23/21 0337  CHOL 103  HDL 33*  LDLCALC 39  TRIG 09/22/21*  CHOLHDL 3.1   Thyroid Function Tests: No  results for input(s): TSH, T4TOTAL, FREET4, T3FREE, THYROIDAB in the last 72 hours. Anemia Panel: No results for input(s): VITAMINB12, FOLATE, FERRITIN, TIBC, IRON, RETICCTPCT in the last 72 hours. Urine analysis:    Component Value Date/Time   COLORURINE YELLOW 07/22/2021 1530   APPEARANCEUR HAZY (A) 07/22/2021 1530   LABSPEC >1.046 (H) 07/22/2021 1530   PHURINE 5.0 07/22/2021 1530   GLUCOSEU NEGATIVE 07/22/2021 1530   HGBUR NEGATIVE 07/22/2021 1530   BILIRUBINUR NEGATIVE 07/22/2021 1530   KETONESUR NEGATIVE 07/22/2021 1530   PROTEINUR NEGATIVE 07/22/2021 1530   UROBILINOGEN 0.2 08/11/2014 0046   NITRITE NEGATIVE 07/22/2021 1530   LEUKOCYTESUR NEGATIVE 07/22/2021 1530   Sepsis Labs: @LABRCNTIP (procalcitonin:4,lacticidven:4)  ) Recent Results (from the past 240 hour(s))  Resp Panel by RT-PCR (Flu A&B, Covid) Nasopharyngeal Swab     Status: None   Collection Time: 07/22/21  3:46 PM   Specimen: Nasopharyngeal Swab; Nasopharyngeal(NP) swabs in vial transport medium  Result Value Ref Range Status   SARS Coronavirus 2 by  RT PCR NEGATIVE NEGATIVE Final    Comment: (NOTE) SARS-CoV-2 target nucleic acids are NOT DETECTED.  The SARS-CoV-2 RNA is generally detectable in upper respiratory specimens during the acute phase of infection. The lowest concentration of SARS-CoV-2 viral copies this assay can detect is 138 copies/mL. A negative result does not preclude SARS-Cov-2 infection and should not be used as the sole basis for treatment or other patient management decisions. A negative result may occur with  improper specimen collection/handling, submission of specimen other than nasopharyngeal swab, presence of viral mutation(s) within the areas targeted by this assay, and inadequate number of viral copies(<138 copies/mL). A negative result must be combined with clinical observations, patient history, and epidemiological information. The expected result is Negative.  Fact Sheet for  Patients:  BloggerCourse.com  Fact Sheet for Healthcare Providers:  SeriousBroker.it  This test is no t yet approved or cleared by the [place] FDA and  has been authorized for detection and/or diagnosis of SARS-CoV-2 by FDA under an Emergency Use Authorization (EUA). This EUA will remain  in effect (meaning this test can be used) for the duration of the COVID-19 declaration under Section 564(b)(1) of the Act, 21 U.S.C.section 360bbb-3(b)(1), unless the authorization is terminated  or revoked sooner.       Influenza A by PCR NEGATIVE NEGATIVE Final   Influenza B by PCR NEGATIVE NEGATIVE Final    Comment: (NOTE) The Xpert Xpress SARS-CoV-2/FLU/RSV plus assay is intended as an aid in the diagnosis of influenza from Nasopharyngeal swab specimens and should not be used as a sole basis for treatment. Nasal washings and aspirates are unacceptable for Xpert Xpress SARS-CoV-2/FLU/RSV testing.  Fact Sheet for Patients: BloggerCourse.com  Fact Sheet for Healthcare Providers: SeriousBroker.it  This test is not yet approved or cleared by the [place] FDA and has been authorized for detection and/or diagnosis of SARS-CoV-2 by FDA under an Emergency Use Authorization (EUA). This EUA will remain in effect (meaning this test can be used) for the duration of the COVID-19 declaration under Section 564(b)(1) of the Act, 21 U.S.C. section 360bbb-3(b)(1), unless the authorization is terminated or revoked.  Performed at [facility], [address] [place], [place] [address]          Radiology Studies: CT ANGIO HEAD NECK W WO CM  Result Date: 07/22/2021 CLINICAL DATA:  Initial evaluation for neuro deficit, stroke suspected. EXAM: CT ANGIOGRAPHY HEAD AND NECK TECHNIQUE: Multidetector CT imaging of the head and neck was performed using the standard protocol during bolus  administration of intravenous contrast. Multiplanar CT image reconstructions and MIPs were obtained to evaluate the vascular anatomy. Carotid stenosis measurements (when applicable) are obtained utilizing NASCET criteria, using the distal internal carotid diameter as the denominator. CONTRAST:  OMNIPAQUE IOHEXOL 350 MG/ML SOLN COMPARISON:  CT from 02/11/2021. FINDINGS: CT HEAD FINDINGS Brain: Moderately advanced cerebral atrophy with chronic small vessel ischemic disease. Multiple scattered remote lacunar infarcts present about the bilateral basal ganglia and thalami. Superimposed mineralization at the deep gray nuclei. No acute intracranial hemorrhage. No acute large vessel territory infarct. No mass lesion or midline shift. Ventricular prominence related to global parenchymal volume loss of hydrocephalus. No extra-axial fluid collection. Vascular: No hyperdense vessel. Calcified atherosclerosis present at the skull base. Skull: Scalp soft tissues within normal limits.  Calvarium intact. Sinuses: Visualized paranasal sinuses and mastoid air cells are clear. Orbits: Globes and orbital soft tissues demonstrate no acute finding. Review of the MIP images confirms the above findings CTA NECK  FINDINGS Aortic arch: Visualized aortic arch normal in caliber with normal branch pattern. Moderate to advanced atheromatous change about the arch and origin of the great vessels without high-grade stenosis. Right carotid system: Right common carotid artery occluded at its origin. Distal reconstitution at the right carotid bifurcation via collateral flow from the right external carotid artery. Bulky calcified plaque at the right carotid bulb and origin of the right ICA with severe near occlusive stenosis (series 9, image 228). Right ICA attenuated but patent distally to the skull base. Left carotid system: Scattered atheromatous plaque throughout the left CCA without high-grade stenosis. Eccentric calcified plaque at the  proximal left ICA with associated stenosis of up to approximately 50% by NASCET criteria. Left ICA tortuous but patent distally without stenosis. Vertebral arteries: Both vertebral arteries arise from subclavian arteries. 50% stenosis noted involving the proximal left subclavian artery prior to the takeoff of the left vertebral artery. Proximal right subclavian artery not well assessed due to adjacent venous contamination. Atheromatous change at the origins of both vertebral arteries with severe ostial stenoses. Additional atheromatous irregularity within the proximal left V1 and V2 segments with associated mild to moderate multifocal narrowing. Vertebral arteries otherwise patent to the skull base without stenosis or other acute abnormality. Skeleton: No discrete or worrisome osseous lesions. Moderate spondylosis at C5-6 and C6-7. Median sternotomy wires noted. Other neck: No other acute soft tissue abnormality within the neck. 1.7 cm soft tissue lesion present at the inferior right parotid gland (series 10, image 114), indeterminate. Upper chest: Scattered ground-glass opacity seen within the partially visualized lungs, favored to reflect pulmonary edema, although multifocal infection could be considered. Review of the MIP images confirms the above findings CTA HEAD FINDINGS Anterior circulation: /petrous segments patent bilaterally. Atheromatous change throughout the carotid siphons with associated severe stenoses at the supraclinoid segments bilaterally. A1 segments patent bilaterally. Right A1 slightly hypoplastic. Normal anterior communicating artery complex. Anterior cerebral arteries patent to their distal aspects without stenosis. No M1 stenosis or occlusion. No proximal MCA branch occlusion. Distal MCA branches perfused and grossly symmetric. Posterior circulation: Both V4 segments patent to the vertebrobasilar junction without stenosis. Neither PICA origin well visualized on this exam. Basilar patent to  its distal aspect without stenosis. Superior cerebral arteries patent bilaterally. Both PCAs primarily supplied via the basilar. Atheromatous change within the PCAs bilaterally, left greater than right, with associated moderate bilateral P2 stenoses. PCAs remain patent to their distal aspects. Venous sinuses: Not well assessed due to timing the contrast bolus. Anatomic variants: None significant. Review of the MIP images confirms the above findings IMPRESSION: 1. Negative CTA for emergent large vessel occlusion. 2. Chronic occlusion of the right common carotid artery at its origin with distal reconstitution at the right carotid bifurcation. Additional severe calcified stenosis at the right bifurcation. 3. Atheromatous change throughout the carotid siphons with associated severe bilateral supraclinoid stenoses. 4. Severe ostial stenoses at the origins of both vertebral arteries. 5. 1.7 cm soft tissue lesion at the inferior right parotid gland, indeterminate. Outpatient ENT referral for further workup and consultation suggested. 6. Scattered ground-glass opacity within the partially visualized lungs, favored to reflect pulmonary edema, although multifocal infection could be considered in the correct clinical setting. Electronically Signed   By: Rise Mu M.D.   On: 07/22/2021 19:38   MR BRAIN WO CONTRAST  Result Date: 07/22/2021 CLINICAL DATA:  Initial evaluation for neuro deficit, stroke suspected. EXAM: MRI HEAD WITHOUT CONTRAST TECHNIQUE: Multiplanar, multiecho pulse sequences of the brain and surrounding  structures were obtained without intravenous contrast. COMPARISON:  Prior study from earlier the same day. FINDINGS: Brain: Diffuse prominence of the CSF containing spaces compatible with generalized cerebral atrophy. Extensive T2/FLAIR hyperintensity throughout the periventricular and deep white matter both cerebral hemispheres with involvement of the deep gray nuclei and brainstem, consistent with  advanced chronic microvascular ischemic disease. Multiple scattered remote lacunar infarcts present about the hemispheric cerebral white matter, basal ganglia, thalami, and pons. 1 cm acute ischemic infarcts seen involving the periventricular white matter of the posterior left frontal corona radiata/centrum semi ovale (series 5, image 88). No associated hemorrhage or mass effect. Additional 6 mm focus of mild diffusion abnormality seen involving the right splenium (series 5, image 78), likely a focus of evolving subacute ischemia. No associated hemorrhage or mass effect. No other evidence for acute or subacute ischemia. Gray-white matter differentiation otherwise maintained. No acute intracranial hemorrhage. Chronic hemosiderin staining at the posterior left frontotemporal region again noted. Few additional chronic micro hemorrhages noted, likely small vessel related. No mass lesion, midline shift or mass effect. No hydrocephalus or extra-axial fluid collection. Pituitary gland suprasellar region within normal limits. Midline structures intact. Vascular: Major intracranial vascular flow voids are maintained. Skull and upper cervical spine: Craniocervical junction within normal limits. Bone marrow signal intensity normal. No scalp soft tissue abnormality. Sinuses/Orbits: Globes and orbital soft tissues demonstrate no acute finding. Paranasal sinuses are largely clear. No mastoid effusion. Inner ear structures grossly normal. Other: None. IMPRESSION: 1. 1 cm acute ischemic nonhemorrhagic periventricular white matter infarct involving the posterior left frontal corona radiata/centrum semi ovale. 2. Additional 6 mm focus of mild diffusion abnormality involving the right splenium, likely a focus of evolving subacute ischemia. No associated hemorrhage or mass effect. 3. Underlying advanced chronic microvascular ischemic disease with multiple remote lacunar infarcts involving the hemispheric cerebral white matter, basal  ganglia, thalami, and pons. Electronically Signed   By: Rise Mu M.D.   On: 07/22/2021 23:37   DG Chest Port 1 View  Result Date: 07/22/2021 CLINICAL DATA:  Weakness EXAM: PORTABLE CHEST 1 VIEW COMPARISON:  Portable exam at 2007 hrs compared to 02/11/2021 FINDINGS: Normal heart size post median sternotomy and CABG. Atherosclerotic calcification aorta. Mediastinal contours and pulmonary vascularity normal. Lungs clear. No infiltrate, pleural effusion, or pneumothorax. Bones demineralized. IMPRESSION: Post CABG. No acute abnormalities. Aortic Atherosclerosis (ICD10-I70.0). Electronically Signed   By: Ulyses Southward M.D.   On: 07/22/2021 20:14   ECHOCARDIOGRAM COMPLETE  Result Date: 07/23/2021    ECHOCARDIOGRAM REPORT   Patient Name:   Lindsey Leon Date of Exam: 07/23/2021 Medical Rec #:  [id number]        Height:       66.0 in Accession #:    [id number]       Weight:       170.0 lb Date of Birth:  [birth year]        BSA:          1.866 m Patient Age:    65 years         BP:           117/58 mmHg Patient Gender: F                HR:           58 bpm. Exam Location:  Inpatient Procedure: 2D Echo Indications:    stroke  History:        Patient has prior history of Echocardiogram examinations, most  recent 01/02/2021. Prior CABG, HIV; Risk Factors:Hypertension and                 Diabetes.  Sonographer:    Delcie Roch RDCS Referring Phys: [id number] Cecille Po MELVIN IMPRESSIONS  1. Left ventricular ejection fraction, by estimation, is 60 to 65%. The left ventricle has normal function. The left ventricle has no regional wall motion abnormalities. Left ventricular diastolic parameters are consistent with Grade I diastolic dysfunction (impaired relaxation). Elevated left atrial pressure.  2. Right ventricular systolic function is normal. The right ventricular size is normal.  3. The mitral valve is normal in structure. Mild to moderate mitral valve regurgitation. Moderate mitral annular  calcification.  4. The aortic valve is tricuspid. There is mild calcification of the aortic valve. There is moderate thickening of the aortic valve. Aortic valve regurgitation is mild to moderate. Mild to moderate aortic valve sclerosis/calcification is present, without any evidence of aortic stenosis. Comparison(s): No significant change from prior study. Prior images reviewed side by side. FINDINGS  Left Ventricle: Left ventricular ejection fraction, by estimation, is 60 to 65%. The left ventricle has normal function. The left ventricle has no regional wall motion abnormalities. The left ventricular internal cavity size was normal in size. There is  no left ventricular hypertrophy. Left ventricular diastolic parameters are consistent with Grade I diastolic dysfunction (impaired relaxation). Elevated left atrial pressure. Right Ventricle: The right ventricular size is normal. No increase in right ventricular wall thickness. Right ventricular systolic function is normal. Left Atrium: Left atrial size was normal in size. Right Atrium: Right atrial size was normal in size. Pericardium: There is no evidence of pericardial effusion. Mitral Valve: The mitral valve is normal in structure. Moderate mitral annular calcification. Mild to moderate mitral valve regurgitation. Tricuspid Valve: The tricuspid valve is normal in structure. Tricuspid valve regurgitation is mild. Aortic Valve: The aortic valve is tricuspid. There is mild calcification of the aortic valve. There is moderate thickening of the aortic valve. Aortic valve regurgitation is mild to moderate. Aortic regurgitation PHT measures 523 msec. Mild to moderate aortic valve sclerosis/calcification is present, without any evidence of aortic stenosis. Aortic valve mean gradient measures 8.5 mmHg. Aortic valve peak gradient measures 16.4 mmHg. Aortic valve area, by VTI measures 2.49 cm. Pulmonic Valve: The pulmonic valve was grossly normal. Pulmonic valve  regurgitation is not visualized. Aorta: The aortic root and ascending aorta are structurally normal, with no evidence of dilitation. IAS/Shunts: No atrial level shunt detected by color flow Doppler.  LEFT VENTRICLE PLAX 2D LVIDd:         4.30 cm   Diastology LVIDs:         2.90 cm   LV e' medial:    5.44 cm/s LV PW:         1.00 cm   LV E/e' medial:  18.8 LV IVS:        0.80 cm   LV e' lateral:   6.64 cm/s LVOT diam:     2.04 cm   LV E/e' lateral: 15.4 LV SV:         106 LV SV Index:   57 LVOT Area:     3.27 cm  RIGHT VENTRICLE            IVC RV S prime:     9.14 cm/s  IVC diam: 1.70 cm TAPSE (M-mode): 1.4 cm LEFT ATRIUM             Index  RIGHT ATRIUM          Index LA diam:        3.50 cm 1.88 cm/m   RA Area:     9.64 cm LA Vol (A2C):   36.9 ml 19.77 ml/m  RA Volume:   18.10 ml 9.70 ml/m LA Vol (A4C):   37.0 ml 19.82 ml/m LA Biplane Vol: 37.3 ml 19.99 ml/m  AORTIC VALVE AV Area (Vmax):    2.40 cm AV Area (Vmean):   2.22 cm AV Area (VTI):     2.49 cm AV Vmax:           202.50 cm/s AV Vmean:          132.000 cm/s AV VTI:            0.425 m AV Peak Grad:      16.4 mmHg AV Mean Grad:      8.5 mmHg LVOT Vmax:         149.00 cm/s LVOT Vmean:        89.600 cm/s LVOT VTI:          0.324 m LVOT/AV VTI ratio: 0.76 AI PHT:            523 msec  AORTA Ao Root diam: 2.70 cm Ao Asc diam:  3.20 cm MITRAL VALVE                TRICUSPID VALVE MV Area (PHT): 2.87 cm     TR Peak grad:   33.4 mmHg MV Decel Time: 264 msec     TR Vmax:        289.00 cm/s MR Peak grad: 135.0 mmHg MR Mean grad: 96.0 mmHg     SHUNTS MR Vmax:      581.00 cm/s   Systemic VTI:  0.32 m MR Vmean:     476.0 cm/s    Systemic Diam: 2.04 cm MV E velocity: 102.00 cm/s MV A velocity: 130.00 cm/s MV E/A ratio:  0.78 Mihai Croitoru MD Electronically signed by Thurmon Fair MD Signature Date/Time: 07/23/2021/12:20:35 PM    Final         Scheduled Meds:  aspirin EC  81 mg Oral Daily   atorvastatin  10 mg Oral q morning    bictegravir-emtricitabine-tenofovir AF  1 tablet Oral q morning   clopidogrel  75 mg Oral Daily   donepezil  5 mg Oral QHS   famotidine  20 mg Oral q morning   gabapentin  300 mg Oral q morning   gabapentin  900 mg Oral QHS   insulin aspart  0-15 Units Subcutaneous TID WC   insulin glargine-yfgn  8 Units Subcutaneous QHS   Continuous Infusions:   LOS: 0 days    Time spent: 35 minutes    Berton Mount, MD  [facility] Pager #: [phone] 7PM-7AM contact night coverage as above

## 2021-07-23 NOTE — ED Notes (Signed)
Patient transported to echo via stretcher

## 2021-07-23 NOTE — Progress Notes (Signed)
  Echocardiogram 2D Echocardiogram has been performed.  Lindsey Leon 07/23/2021, 8:42 AM

## 2021-07-23 NOTE — Evaluation (Signed)
Occupational Therapy Evaluation Patient Details Name: Lindsey Leon MRN: [id number] DOB: 19-Mar-1956 Today's Date: 07/23/2021   History of Present Illness Pt is a 65 y.o. female who presented 07/22/21 with increased R sided weakness. Outside window for tPA. MRI of brain revealed acute infarct in posterior L frontal corona radiata/centrum semi ovale and additional abnormality involving the R splenium, likely a focus of evolving subacute ischemia and multiple remote lacunar infarcts involving the hemispheric cerebral white matter, basal ganglia, thalami, and pons. PMH: CAD status post CABG, HOCM, well-controlled HIV, chronic hep C, hypertension, dementia, pancreatitis, insulin-dependent diabetes, stroke.   Clinical Impression   Pt admitted with above. She demonstrates the below listed deficits and will benefit from continued OT to maximize safety and independence with BADLs.  Pt presents to OT with generalized weakness, impaired balance, impaired cognition, Rt UE weakness.  She currently requires set up - mod A for UB ADLs and max - total A for LB ADLs.  PTA, pt resided at SNF where she reports she was mod I with ADLs and functional mobility.   Recommend SNF level rehab at discahrge as she has had a significant change in status.        Recommendations for follow up therapy are one component of a multi-disciplinary discharge planning process, led by the attending physician.  Recommendations may be updated based on patient status, additional functional criteria and insurance authorization.   Follow Up Recommendations  SNF    Equipment Recommendations  None recommended by OT    Recommendations for Other Services       Precautions / Restrictions Precautions Precautions: Fall Precaution Comments: R hemiparesis      Mobility Bed Mobility Overal bed mobility: Needs Assistance Bed Mobility: Supine to Sit;Sit to Supine     Supine to sit: Mod assist;HOB elevated Sit to supine: Max assist    General bed mobility comments: Cues provided to bring legs off R edge of stretcher, pt having difficulty with managing R leg. ModA to pivot buttocks and ascend trunk. MaxA to manage legs and trunk back to supine.    Transfers                      Balance Overall balance assessment: Needs assistance Sitting-balance support: Single extremity supported;No upper extremity supported;Feet supported Sitting balance-Leahy Scale: Fair                                     ADL either performed or assessed with clinical judgement   ADL Overall ADL's : Needs assistance/impaired Eating/Feeding: Modified independent;Bed level   Grooming: Wash/dry hands;Wash/dry face;Oral care;Brushing hair;Bed level   Upper Body Bathing: Minimal assistance;Sitting;Bed level   Lower Body Bathing: Maximal assistance;Bed level;Sit to/from stand   Upper Body Dressing : Maximal assistance;Sitting;Bed level   Lower Body Dressing: Total assistance;Bed level;Sit to/from stand   Toilet Transfer: Maximal assistance;Stand-pivot;BSC           Functional mobility during ADLs: Maximal assistance       Vision Baseline Vision/History: 0 No visual deficits Vision Assessment?: Yes Eye Alignment: Within Functional Limits Ocular Range of Motion: Within Functional Limits Alignment/Gaze Preference: Within Defined Limits Tracking/Visual Pursuits: Able to track stimulus in all quads without difficulty Visual Fields: No apparent deficits     Perception Perception Perception Tested?: Yes   Praxis Praxis Praxis tested?: Deficits Deficits: Initiation    Pertinent Vitals/Pain Pain Assessment: No/denies pain  Hand Dominance Right   Extremity/Trunk Assessment Upper Extremity Assessment Upper Extremity Assessment: RUE deficits/detail RUE Deficits / Details: shoulder 3-/5 - fatigues quickly.  elbow distally 3/5 RUE Coordination: decreased fine motor;decreased gross motor   Lower Extremity  Assessment Lower Extremity Assessment: Defer to PT evaluation   Cervical / Trunk Assessment Cervical / Trunk Assessment: Kyphotic   Communication Communication Communication: No difficulties   Cognition Arousal/Alertness: Awake/alert Behavior During Therapy: WFL for tasks assessed/performed Overall Cognitive Status: History of cognitive impairments - at baseline                                 General Comments: Pt with documented h/o memory loss.  She was oriented to year and to month with one cue.  She is slow to respond to questions   General Comments       Exercises Exercises: Other exercises Other Exercises Other Exercises: pt performed 5 reps shoulder flexion with mod cues   Shoulder Instructions      Home Living Family/patient expects to be discharged to:: Skilled nursing facility   Available Help at Discharge: Skilled Nursing Facility Type of Home: Skilled Nursing Facility                           Additional Comments: Pt reports she has resided at [facility] for ~2 years      Prior Functioning/Environment Level of Independence: Needs assistance  Gait / Transfers Assistance Needed: Pt reports being mod I with all functional mobility using a rollator. ADL's / Homemaking Assistance Needed: Pt reports staff take her to the showers but she can clean and dress herself. Staff cook and clean.            OT Problem List: Decreased strength;Decreased activity tolerance;Impaired balance (sitting and/or standing);Decreased coordination;Decreased cognition;Decreased knowledge of use of DME or AE;Impaired UE functional use      OT Treatment/Interventions: Self-care/ADL training;Neuromuscular education;DME and/or AE instruction;Therapeutic activities;Therapeutic exercise;Cognitive remediation/compensation;Patient/family education;Balance training    OT Goals(Current goals can be found in the care plan section) Acute Rehab OT Goals Patient Stated  Goal: to get back to normal OT Goal Formulation: With patient Time For Goal Achievement: 08/06/21 Potential to Achieve Goals: Good ADL Goals Pt Will Perform Grooming: with min assist;standing Pt Will Perform Upper Body Bathing: with set-up;sitting Pt Will Perform Lower Body Bathing: with min assist;sit to/from stand Pt Will Perform Upper Body Dressing: with set-up;with supervision;sitting Pt Will Perform Lower Body Dressing: with min assist;sit to/from stand Pt/caregiver will Perform Home Exercise Program: Increased strength;Right Upper extremity;With Supervision;With written HEP provided  OT Frequency: Min 2X/week   Barriers to D/C: Decreased caregiver support  resides in SNF       Co-evaluation              AM-PAC OT "6 Clicks" Daily Activity     Outcome Measure Help from another person eating meals?: None Help from another person taking care of personal grooming?: A Little Help from another person toileting, which includes using toliet, bedpan, or urinal?: A Lot Help from another person bathing (including washing, rinsing, drying)?: A Lot Help from another person to put on and taking off regular upper body clothing?: A Lot Help from another person to put on and taking off regular lower body clothing?: Total 6 Click Score: 14   End of Session Nurse Communication: Mobility status  Activity Tolerance: Patient tolerated  treatment well Patient left: in bed;with call bell/phone within reach  OT Visit Diagnosis: Unsteadiness on feet (R26.81);Muscle weakness (generalized) (M62.81);Cognitive communication deficit (R41.841) Symptoms and signs involving cognitive functions: Cerebral infarction                Time: 1610-9604 OT Time Calculation (min): 11 min Charges:  OT General Charges $OT Visit: 1 Visit OT Evaluation $OT Eval Moderate Complexity: 1 Mod  Eber Jones., OTR/L Acute Rehabilitation Services Pager [phone] Office [phone]   Jeani Hawking M 07/23/2021,  3:10 PM

## 2021-07-23 NOTE — Progress Notes (Signed)
Pt arrived on the unit, Alert, oriented x3. VS stable, MD notified. NIH insignificant in comparison. Bed in low position, alarms are on, call bell in reach

## 2021-07-23 NOTE — Progress Notes (Signed)
We were trying to to put pt on the tele box, however it was impossible. We changed 3 boxes, 3 wires of leads, put her on monitor at the bedside, and still no connection. Charge nurse notified. She will address it as soon as possible

## 2021-07-23 NOTE — Evaluation (Signed)
Physical Therapy Evaluation Patient Details Name: Lindsey Leon MRN: [id number] DOB: Apr 22, 1956 Today's Date: 07/23/2021  History of Present Illness  Pt is a 65 y.o. female who presented 07/22/21 with increased R sided weakness. Outside window for tPA. MRI of brain revealed acute infarct in posterior L frontal corona radiata/centrum semi ovale and additional abnormality involving the R splenium, likely a focus of evolving subacute ischemia and multiple remote lacunar infarcts involving the hemispheric cerebral white matter, basal ganglia, thalami, and pons. PMH: CAD status post CABG, HOCM, well-controlled HIV, chronic hep C, hypertension, dementia, pancreatitis, insulin-dependent diabetes, stroke.   Clinical Impression  Pt presents with condition above and deficits mentioned below, see PT Problem List. PTA, she was living at a SNF, ambulating with mod I using a rollator. Currently, pt displays significantly increased R UE and R lower extremity weakness, impacting her ability to grip the RW and extend her hip and knee to stand safely. Pt also with deficits in proprioception on her R side, placing her at risk for injuries. She displays deficits in balance and activity tolerance as well. She required mod-maxA for bed mobility, modA to transfer to stand, and maxA to take a couple steps anterior <> posterior in place in front of the stretcher with a RW this date. She is at high risk for falls. Recommending pt return to SNF with skilled PT services to address her deficits and maximize her safety and independence with all functional mobility. Will continue to follow acutely.       Recommendations for follow up therapy are one component of a multi-disciplinary discharge planning process, led by the attending physician.  Recommendations may be updated based on patient status, additional functional criteria and insurance authorization.  Follow Up Recommendations SNF;Supervision/Assistance - 24 hour     Equipment Recommendations  Other (comment) (defer to next venue of care)    Recommendations for Other Services       Precautions / Restrictions Precautions Precautions: Fall Precaution Comments: R hemiparesis Restrictions Weight Bearing Restrictions: No      Mobility  Bed Mobility Overal bed mobility: Needs Assistance Bed Mobility: Supine to Sit;Sit to Supine     Supine to sit: Mod assist;HOB elevated Sit to supine: Max assist   General bed mobility comments: Cues provided to bring legs off R edge of stretcher, pt having difficulty with managing R leg. ModA to pivot buttocks and ascend trunk. MaxA to manage legs and trunk back to supine.    Transfers Overall transfer level: Needs assistance Equipment used: Rolling walker (2 wheeled) Transfers: Sit to/from Stand Sit to Stand: Mod assist         General transfer comment: Provided R knee block and boost under buttocks to extend hips, heavy modA to come to stand from edge of stretcher. cues provided for hand placement.  Ambulation/Gait Ambulation/Gait assistance: Max assist Gait Distance (Feet): 1 Feet Assistive device: Rolling walker (2 wheeled) Gait Pattern/deviations: Step-to pattern;Decreased stride length;Decreased stance time - right;Decreased weight shift to right;Decreased weight shift to left;Decreased dorsiflexion - right;Decreased step length - right;Decreased step length - left;Trunk flexed Gait velocity: reduced Gait velocity interpretation: <1.31 ft/sec, indicative of household ambulator General Gait Details: Pt with poor placement of R foot often, needing assistance to reposition as she would place it far posterior almost under the stretcher intermittently. Tactile and verbal cues provided for lateral weight shifting bil. Knee block provided at R knee for safety during R stance. Assistance required to maintain balance, extend hips, shift weight, and advance  R leg. Performed a few steps anterior <> posterior at  edge of stretcher with each leg, maxA.  Stairs            Wheelchair Mobility    Modified Rankin (Stroke Patients Only) Modified Rankin (Stroke Patients Only) Pre-Morbid Rankin Score: Moderate disability Modified Rankin: Moderately severe disability     Balance Overall balance assessment: Needs assistance Sitting-balance support: Single extremity supported;No upper extremity supported;Feet supported Sitting balance-Leahy Scale: Fair Sitting balance - Comments: Pt with L lateral lean, needing min guard-minA to maintain static sitting balance edge of stretcher. Postural control: Left lateral lean;Posterior lean Standing balance support: Bilateral upper extremity supported Standing balance-Leahy Scale: Poor Standing balance comment: Mod-maxA and bil UE support for static and dynamic standing.                             Pertinent Vitals/Pain Pain Assessment: Faces Faces Pain Scale: Hurts a little bit Pain Location: toes Pain Descriptors / Indicators: Discomfort;Grimacing;Guarding Pain Intervention(s): Limited activity within patient's tolerance;Monitored during session;Repositioned    Home Living Family/patient expects to be discharged to:: Skilled nursing facility                 Additional Comments: Facility has various types of equipment available to pts. Pt has a rollator and hospital bed that she uses there.    Prior Function Level of Independence: Needs assistance   Gait / Transfers Assistance Needed: Pt reports being mod I with all functional mobility using a rollator.  ADL's / Homemaking Assistance Needed: Pt reports staff take her to the showers but she can clean and dress herself. Staff cook and clean.        Hand Dominance   Dominant Hand: Right    Extremity/Trunk Assessment   Upper Extremity Assessment Upper Extremity Assessment: Defer to OT evaluation    Lower Extremity Assessment Lower Extremity Assessment: RLE  deficits/detail RLE Deficits / Details: Decreased strength with MMT scores of 2+ hip flexion, 4- knee extension, 3- ankle dorsiflexion; able to detect light touch, but displays deficits in proprioception functionally; gross incoordination RLE Sensation: decreased proprioception RLE Coordination: decreased fine motor;decreased gross motor    Cervical / Trunk Assessment Cervical / Trunk Assessment: Kyphotic  Communication   Communication: No difficulties  Cognition Arousal/Alertness: Awake/alert Behavior During Therapy: WFL for tasks assessed/performed Overall Cognitive Status: History of cognitive impairments - at baseline                                 General Comments: Pt disoriented to time and slow to process and respond to questions. Poor spatial and body awareness and sequencing of cues. benefits from simple single-step multi-modal commands.      General Comments General comments (skin integrity, edema, etc.): Educated pt on performing quad sets and gripping rolled towel with R hand    Exercises     Assessment/Plan    PT Assessment Patient needs continued PT services  PT Problem List Decreased strength;Decreased range of motion;Decreased activity tolerance;Decreased balance;Decreased mobility;Decreased coordination;Decreased cognition;Decreased knowledge of use of DME;Decreased safety awareness;Impaired sensation       PT Treatment Interventions DME instruction;Gait training;Functional mobility training;Therapeutic activities;Therapeutic exercise;Balance training;Neuromuscular re-education;Cognitive remediation;Patient/family education;Wheelchair mobility training    PT Goals (Current goals can be found in the Care Plan section)  Acute Rehab PT Goals Patient Stated Goal: to get her R side stronger PT Goal  Formulation: With patient Time For Goal Achievement: 08/06/21 Potential to Achieve Goals: Fair    Frequency Min 3X/week   Barriers to discharge         Co-evaluation               AM-PAC PT "6 Clicks" Mobility  Outcome Measure Help needed turning from your back to your side while in a flat bed without using bedrails?: A Lot Help needed moving from lying on your back to sitting on the side of a flat bed without using bedrails?: A Lot Help needed moving to and from a bed to a chair (including a wheelchair)?: A Lot Help needed standing up from a chair using your arms (e.g., wheelchair or bedside chair)?: A Lot Help needed to walk in hospital room?: A Lot Help needed climbing 3-5 steps with a railing? : Total 6 Click Score: 11    End of Session Equipment Utilized During Treatment: Gait belt Activity Tolerance: Patient tolerated treatment well Patient left: in bed;with call bell/phone within reach Nurse Communication: Mobility status PT Visit Diagnosis: Unsteadiness on feet (R26.81);Other abnormalities of gait and mobility (R26.89);Muscle weakness (generalized) (M62.81);Difficulty in walking, not elsewhere classified (R26.2);Other symptoms and signs involving the nervous system (R29.898);Hemiplegia and hemiparesis Hemiplegia - Right/Left: Right Hemiplegia - dominant/non-dominant: Dominant Hemiplegia - caused by: Cerebral infarction    Time: 0347-4259 PT Time Calculation (min) (ACUTE ONLY): 28 min   Charges:   PT Evaluation $PT Eval Moderate Complexity: 1 Mod PT Treatments $Gait Training: 8-22 mins        Raymond Gurney, PT, DPT Acute Rehabilitation Services  Pager: [phone] Office: [phone]   Jewel Baize 07/23/2021, 10:35 AM

## 2021-07-24 DIAGNOSIS — I6302 Cerebral infarction due to thrombosis of basilar artery: Secondary | ICD-10-CM | POA: Diagnosis not present

## 2021-07-24 DIAGNOSIS — I10 Essential (primary) hypertension: Secondary | ICD-10-CM | POA: Diagnosis not present

## 2021-07-24 LAB — GLUCOSE, CAPILLARY
Glucose-Capillary: 132 mg/dL — ABNORMAL HIGH (ref 70–99)
Glucose-Capillary: 138 mg/dL — ABNORMAL HIGH (ref 70–99)
Glucose-Capillary: 127 mg/dL — ABNORMAL HIGH (ref 70–99)

## 2021-07-24 NOTE — TOC Initial Note (Signed)
Transition of Care [facility]) - Initial/Assessment Note    Patient Details  Name: Lindsey Leon MRN: [id number] Date of Birth: 03/15/1956  [facility] CM/SW Contact:    Delilah Shan, LCSWA Phone Number: 07/24/2021, 1:57 PM  Clinical Narrative:                  CSW received consult for possible SNF placement at time of discharge. CSW spoke with patients sister [name] regarding PT recommendation of SNF placement at time of discharge. Patients sister [name] reports patient comes from [place] long term.  Patients sister expressed understanding of PT recommendation and is agreeable to patient returning to [place] and is agreeable to patient  receiving short term rehab there.[name] reports patient has received both COVID vaccines.  No further questions reported at this time. CSW spoke with Malena Peer at [facility] who confimed patient is long term and can return when medically ready.CSW to continue to follow and assist with discharge planning needs.   Expected Discharge Plan: Skilled Nursing Facility Barriers to Discharge: Continued Medical Work up   Patient Goals and CMS Choice Patient states their goals for this hospitalization and ongoing recovery are:: SNF (spoke with patients sister [name]) CMS Medicare.gov Compare Post Acute Care list provided to:: Patient Represenative (must comment) (CSW spoke with patients sister [name]) Choice offered to / list presented to : Sibling (Patients sister [name])  Expected Discharge Plan and Services Expected Discharge Plan: Skilled Nursing Facility In-house Referral: Clinical Social Work                                            Prior Living Arrangements/Services   Lives with:: Facility Resident (Patients sister reports patient comes from [place]) Patient language and need for interpreter reviewed:: Yes Do you feel safe going back to the place where you live?: No (From [place] Long term plan to go to rehab at  [place])   SNF  Need for Family Participation in Patient Care: Yes (Comment) Care giver support system in place?: Yes (comment)   Criminal Activity/Legal Involvement Pertinent to Current Situation/Hospitalization: No - Comment as needed  Activities of Daily Living      Permission Sought/Granted Permission sought to share information with : Case Manager, Family Supports, Magazine features editor Permission granted to share information with : No  Share Information with NAME: Patient only oriented to self and place spoke with patients sister [name]  Permission granted to share info w AGENCY: Patient only oriented to self and place spoke with patients sister Theresa/SNF  Permission granted to share info w Relationship: Patient only oriented to self and place spoke with patients sister [name]  Permission granted to share info w Contact Information: Patient only oriented to self and place spoke with patients sister [name] [phone]  Emotional Assessment       Orientation: : Oriented to Self, Oriented to Place Alcohol / Substance Use: Not Applicable Psych Involvement: No (comment)  Admission diagnosis:  Weakness [R53.1] Acute focal neurological deficit [R29.818] Acute CVA (cerebrovascular accident) [facility]) [I63.9] Patient Active Problem List   Diagnosis Date Noted   Acute CVA (cerebrovascular accident) (HCC) 07/23/2021   Acute focal neurological deficit 07/22/2021   Diplopia 01/01/2021   Cranial nerve III palsy, 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   HTN (hypertension) 01/01/2021   Routine screening for STI (sexually transmitted infection) 12/01/2019   Type 2 diabetes mellitus with hyperglycemia (HCC) 02/2019  Dementia (HCC) 12/27/2018   Human immunodeficiency virus (HIV) disease (HCC) 12/25/2018   Carotid artery occlusion 12/25/2018   CVA (cerebral vascular accident) (HCC) 12/25/2018   Chronic hepatitis C without hepatic coma (HCC) 12/2018   TIA (transient ischemic attack) 12/06/2018    ICH (intracerebral hemorrhage) (HCC) 05/23/2014   Diverticulitis of colon 02/24/2013   Pancreatitis, Hx of 02/24/2013   Alcohol abuse 02/24/2013   PCP:  Karna Dupes, MD Pharmacy:  No Pharmacies Listed    Social Determinants of Health (SDOH) Interventions    Readmission Risk Interventions No flowsheet data found.

## 2021-07-24 NOTE — [place]2 (Signed)
[place] MEDICAID [place]2 LEVEL OF CARE SCREENING TOOL     IDENTIFICATION  Patient Name: Lindsey Leon Birthdate: [birth year] Sex: female Admission Date (Current Location): 07/22/2021  [facility] and [place] Number:  Producer, television/film/video and Address:  [facility] [place], [address] [place], [place] [address]      Provider Number: [id number]  Attending Physician Name and Address:  Barnetta Chapel, MD  Relative Name and Phone Number:  [name] [phone]    Current Level of Care: Hospital Recommended Level of Care: Skilled Nursing Facility Prior Approval Number:    Date Approved/Denied:   PASRR Number: [id number]  Discharge Plan: SNF    Current Diagnoses: Patient Active Problem List   Diagnosis Date Noted   Acute CVA (cerebrovascular accident) (HCC) 07/23/2021   Acute focal neurological deficit 07/22/2021   Diplopia 01/01/2021   Cranial nerve III palsy, 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   HTN (hypertension) 01/01/2021   Routine screening for STI (sexually transmitted infection) 12/01/2019   Type 2 diabetes mellitus with hyperglycemia (HCC) 02/2019   Dementia (HCC) 12/27/2018   Human immunodeficiency virus (HIV) disease (HCC) 12/25/2018   Carotid artery occlusion 12/25/2018   CVA (cerebral vascular accident) (HCC) 12/25/2018   Chronic hepatitis C without hepatic coma (HCC) 12/2018   TIA (transient ischemic attack) 12/06/2018   ICH (intracerebral hemorrhage) (HCC) 05/23/2014   Diverticulitis of colon 02/24/2013   Pancreatitis, Hx of 02/24/2013   Alcohol abuse 02/24/2013    Orientation RESPIRATION BLADDER Height & Weight     Self, Place  Normal Continent, External catheter (External Urinary Catheter) Weight: 170 lb (77.1 kg) Height:  5\' 6"  (167.6 cm)  BEHAVIORAL SYMPTOMS/MOOD NEUROLOGICAL BOWEL NUTRITION STATUS      Continent (WDL) Diet (Please see discharge summary)  AMBULATORY STATUS COMMUNICATION OF NEEDS Skin   Extensive Assist Verbally Normal (WDL)                        Personal Care Assistance Level of Assistance  Bathing, Feeding, Dressing Bathing Assistance: Limited assistance Feeding assistance: Independent (able to feed self) Dressing Assistance: Limited assistance     Functional Limitations Info  Sight, Hearing, Speech   Hearing Info: Adequate Speech Info: Adequate    SPECIAL CARE FACTORS FREQUENCY  PT (By licensed PT), OT (By licensed OT)     PT Frequency: 5x min weekly OT Frequency: 5x min weekly            Contractures Contractures Info: Not present    Additional Factors Info  Code Status, Insulin Sliding Scale, Allergies Code Status Info: FULL Allergies Info: No Known Allergies   Insulin Sliding Scale Info: insulin aspart (novoLOG) injection 0-15 Units 3 times daily with meals,insulin glargine-yfgn (SEMGLEE) injection 8 Units daily at bedtime       Current Medications (07/24/2021):  This is the current hospital active medication list Current Facility-Administered Medications  Medication Dose Route Frequency Provider Last Rate Last Admin   acetaminophen (TYLENOL) tablet 650 mg  650 mg Oral Q4H PRN 09/23/2021, MD       Or   acetaminophen (TYLENOL) 160 MG/5ML solution 650 mg  650 mg Per Tube Q4H PRN Synetta Fail, MD       Or   acetaminophen (TYLENOL) suppository 650 mg  650 mg Rectal Q4H PRN Synetta Fail, MD       albuterol (PROVENTIL) (2.5 MG/3ML) 0.083% nebulizer solution 3 mL  3 mL Inhalation Q8H PRN Synetta Fail,  MD       aspirin EC tablet 81 mg  81 mg Oral Daily Synetta Fail, MD   81 mg at 07/24/21 0915   atorvastatin (LIPITOR) tablet 10 mg  10 mg Oral q morning Synetta Fail, MD   10 mg at 07/24/21 0915   bictegravir-emtricitabine-tenofovir AF (BIKTARVY) 50-200-25 MG per tablet 1 tablet  1 tablet Oral q morning Synetta Fail, MD   1 tablet at 07/24/21 0915   calcium carbonate (TUMS - dosed in mg elemental calcium) chewable tablet 400 mg of  elemental calcium  2 tablet Oral Daily PRN Synetta Fail, MD       clopidogrel (PLAVIX) tablet 75 mg  75 mg Oral Daily Synetta Fail, MD   75 mg at 07/24/21 0915   donepezil (ARICEPT) tablet 5 mg  5 mg Oral QHS Synetta Fail, MD   5 mg at 07/23/21 2136   famotidine (PEPCID) tablet 20 mg  20 mg Oral q morning Synetta Fail, MD   20 mg at 07/24/21 0915   gabapentin (NEURONTIN) capsule 300 mg  300 mg Oral q morning Synetta Fail, MD   300 mg at 07/24/21 0915   gabapentin (NEURONTIN) capsule 900 mg  900 mg Oral QHS Cathie Hoops, RPH   900 mg at 07/23/21 2137   insulin aspart (novoLOG) injection 0-15 Units  0-15 Units Subcutaneous TID WC Synetta Fail, MD   2 Units at 07/24/21 1201   insulin glargine-yfgn (SEMGLEE) injection 8 Units  8 Units Subcutaneous QHS Synetta Fail, MD   8 Units at 07/22/21 2216   polyethylene glycol (MIRALAX / GLYCOLAX) packet 17 g  17 g Oral Daily PRN Synetta Fail, MD         Discharge Medications: Please see discharge summary for a list of discharge medications.  Relevant Imaging Results:  Relevant Lab Results:   Additional Information [id number], Both Covid Vaccines  Delilah Shan, [address]

## 2021-07-24 NOTE — Progress Notes (Signed)
PROGRESS NOTE    Lindsey Leon  XLK:[id number] DOB: 1956/02/27 DOA: 07/22/2021 PCP: Karna Dupes, MD  Outpatient Specialists:   Brief Narrative:  Patient is a 65 year old African-American female past medical history significant for prior CVA, significant alcohol use, carotid artery disease, chronic hep C, dementia, diverticulosis, hypertension, HIV, diabetes mellitus, coronary artery disease status post CABG.  Patient was admitted with history of right-sided weakness and, concerns for possible recurrent CVA.  Patient is not a particularly good historian.  Work-up done so far is nonrevealing.  Awaiting neurology input.  Patient is on Plavix 75 Mg p.o. once daily and aspirin 81 Mg p.o. once daily.  07/24/2021: Patient seen.  No new complaints.  Patient is improving slowly.  Awaiting neurology input.   Assessment & Plan:   Principal Problem:   Acute focal neurological deficit Active Problems:   Human immunodeficiency virus (HIV) disease (HCC)   Carotid artery occlusion   CVA (cerebral vascular accident) (HCC)   Dementia (HCC)   Chronic hepatitis C without hepatic coma (HCC)   Type 2 diabetes mellitus with hyperglycemia (HCC)   HTN (hypertension)   Acute CVA (cerebrovascular accident) (HCC)  Focal neurologic deficit History of CVA Carotid stenosis > History of prior CVA and known vascular disease with carotid artery disease.  Presenting with right-sided weakness similar to previous stroke.  No other evidence of exacerbation that could indicate unmasking of prior symptoms. > CTA head and neck in the ED showed no new large occlusion to showed known carotid disease as well as vertebral stenosis and cerebrovascular atherosclerosis. > No low blood pressures to indicate decrease in perfusion at this time.  Does have a lower heart rate in the 50s to 60s this may have contributed to her being more prone to additional ischemic insults considering her vascular disease. > Awaiting neurology  input. -Continue to allow for permissive HTN (systolic < 220 and diastolic < 120) - Statin  -Patient is currently on aspirin and Plavix - Echocardiogram result is pending. - MR brain revealed: 1. 1 cm acute ischemic nonhemorrhagic periventricular white matter infarct involving the posterior left frontal corona radiata/centrum semi ovale. 2. Additional 6 mm focus of mild diffusion abnormality involving the right splenium, likely a focus of evolving subacute ischemia. No associated hemorrhage or mass effect. 3. Underlying advanced chronic microvascular ischemic disease with multiple remote lacunar infarcts involving the hemispheric cerebral white matter, basal ganglia, thalami, and pons.   - A1C was 6.3% - Lipid panel revealed LDL of 39, total cholesterol of 103, HDL of 33 and triglyceride of 153.  Continue statins. - Tele monitoring  - SLP eval - PT/OT input is highly appreciated.  Skilled nursing facility recommended. Patient is improving slowly.   HIV - Continue home Biktarvy  Diabetes > On 13 units Lantus nightly at facility - Lantus 8 units nightly, SSI   Dementia - Continue home donepezil  Hypertension - Holding home antihypertensives in the setting of permissive hypertension  CAD status post CABG - Continuing with home Plavix - ASA as above - Holding antihypertensives as above   Chronic hepatitis C - LFTs stable   Prolonged QTC - Avoid QTC prolonging medications - EKG in a.m.   Parotid lesion > Incidental parotid lesion finding on CT head. - Recommendation for ENT follow-up on discharge.     DVT prophylaxis: SCD. Code Status: Full code Family Communication:  Disposition Plan: Likely skilled nursing facility   Consultants:  Awaiting neurology input  Procedures:  Echo result is pending  Antimicrobials:  None   Subjective: Poor historian. Reports right lower extremity cramp.  Objective: Vitals:   07/24/21 0000 07/24/21 0315 07/24/21 0442  07/24/21 1624  BP: (!) 153/57 (!) 126/55    Pulse:   80 70  Resp: 18     Temp: 97.9 F (36.6 C)  (!) 97.4 F (36.3 C) 98.2 F (36.8 C)  TempSrc: Oral  Oral Oral  SpO2: 98%  97% 99%  Weight:      Height:        Intake/Output Summary (Last 24 hours) at 07/24/2021 1808 Last data filed at 07/24/2021 1709 Gross per 24 hour  Intake 240 ml  Output 500 ml  Net -260 ml   Filed Weights   07/22/21 1518  Weight: 77.1 kg    Examination:  General exam: Appears calm and comfortable.  Patient is not in any obvious distress. Respiratory system: Clear to auscultation.  Cardiovascular system: S1 & S2  Gastrointestinal system: Abdomen is nondistended, soft and nontender. No organomegaly or masses felt. Normal bowel sounds heard. Central nervous system: Awake and alert.  Patient moves all extremities.  Patient will not comply with detailed physical examination.   Extremities: No leg edema  Data Reviewed: I have personally reviewed following labs and imaging studies  CBC: Recent Labs  Lab 07/22/21 1656 07/22/21 1718  WBC 7.5  --   NEUTROABS 4.1  --   HGB 12.4 11.9*  HCT 36.8 35.0*  MCV 98.9  --   PLT 238  --     Basic Metabolic Panel: Recent Labs  Lab 07/22/21 1656 07/22/21 1718  NA 141 142  K 3.6 3.6  CL 105 105  CO2 25  --   GLUCOSE 113* 113*  BUN 18 19  CREATININE 1.15* 1.00  CALCIUM 9.7  --     GFR: Estimated Creatinine Clearance: 58.8 mL/min (by C-G formula based on SCr of 1 mg/dL). Liver Function Tests: Recent Labs  Lab 07/22/21 1656  AST 20  ALT 17  ALKPHOS 42  BILITOT 0.6  PROT 7.2  ALBUMIN 4.2    No results for input(s): LIPASE, AMYLASE in the last 168 hours. No results for input(s): AMMONIA in the last 168 hours. Coagulation Profile: Recent Labs  Lab 07/22/21 1656  INR 1.1    Cardiac Enzymes: No results for input(s): CKTOTAL, CKMB, CKMBINDEX, TROPONINI in the last 168 hours. BNP (last 3 results) No results for input(s): PROBNP in the last  8760 hours. HbA1C: Recent Labs    07/23/21 0336  HGBA1C 6.3*    CBG: Recent Labs  Lab 07/23/21 1640 07/23/21 2309 07/24/21 0646 07/24/21 1146 07/24/21 1538  GLUCAP 104* 103* 132* 138* 127*    Lipid Profile: Recent Labs    07/23/21 0337  CHOL 103  HDL 33*  LDLCALC 39  TRIG 161*  CHOLHDL 3.1    Thyroid Function Tests: No results for input(s): TSH, T4TOTAL, FREET4, T3FREE, THYROIDAB in the last 72 hours. Anemia Panel: No results for input(s): VITAMINB12, FOLATE, FERRITIN, TIBC, IRON, RETICCTPCT in the last 72 hours. Urine analysis:    Component Value Date/Time   COLORURINE YELLOW 07/22/2021 1530   APPEARANCEUR HAZY (A) 07/22/2021 1530   LABSPEC >1.046 (H) 07/22/2021 1530   PHURINE 5.0 07/22/2021 1530   GLUCOSEU NEGATIVE 07/22/2021 1530   HGBUR NEGATIVE 07/22/2021 1530   BILIRUBINUR NEGATIVE 07/22/2021 1530   KETONESUR NEGATIVE 07/22/2021 1530   PROTEINUR NEGATIVE 07/22/2021 1530   UROBILINOGEN 0.2 08/11/2014 0046   NITRITE NEGATIVE 07/22/2021 1530  LEUKOCYTESUR NEGATIVE 07/22/2021 1530   Sepsis Labs: (procalcitonin:4,lacticidven:4)  ) Recent Results (from the past 240 hour(s))  Resp Panel by RT-PCR (Flu A&B, Covid) Nasopharyngeal Swab     Status: None   Collection Time: 07/22/21  3:46 PM   Specimen: Nasopharyngeal Swab; Nasopharyngeal(NP) swabs in vial transport medium  Result Value Ref Range Status   SARS Coronavirus 2 by RT PCR NEGATIVE NEGATIVE Final    Comment: (NOTE) SARS-CoV-2 target nucleic acids are NOT DETECTED.  The SARS-CoV-2 RNA is generally detectable in upper respiratory specimens during the acute phase of infection. The lowest concentration of SARS-CoV-2 viral copies this assay can detect is 138 copies/mL. A negative result does not preclude SARS-Cov-2 infection and should not be used as the sole basis for treatment or other patient management decisions. A negative result may occur with  improper specimen collection/handling,  submission of specimen other than nasopharyngeal swab, presence of viral mutation(s) within the areas targeted by this assay, and inadequate number of viral copies(<138 copies/mL). A negative result must be combined with clinical observations, patient history, and epidemiological information. The expected result is Negative.  Fact Sheet for Patients:  BloggerCourse.com  Fact Sheet for Healthcare Providers:  SeriousBroker.it  This test is no t yet approved or cleared by the [place] FDA and  has been authorized for detection and/or diagnosis of SARS-CoV-2 by FDA under an Emergency Use Authorization (EUA). This EUA will remain  in effect (meaning this test can be used) for the duration of the COVID-19 declaration under Section 564(b)(1) of the Act, 21 U.S.C.section 360bbb-3(b)(1), unless the authorization is terminated  or revoked sooner.       Influenza A by PCR NEGATIVE NEGATIVE Final   Influenza B by PCR NEGATIVE NEGATIVE Final    Comment: (NOTE) The Xpert Xpress SARS-CoV-2/FLU/RSV plus assay is intended as an aid in the diagnosis of influenza from Nasopharyngeal swab specimens and should not be used as a sole basis for treatment. Nasal washings and aspirates are unacceptable for Xpert Xpress SARS-CoV-2/FLU/RSV testing.  Fact Sheet for Patients: BloggerCourse.com  Fact Sheet for Healthcare Providers: SeriousBroker.it  This test is not yet approved or cleared by the [place] FDA and has been authorized for detection and/or diagnosis of SARS-CoV-2 by FDA under an Emergency Use Authorization (EUA). This EUA will remain in effect (meaning this test can be used) for the duration of the COVID-19 declaration under Section 564(b)(1) of the Act, 21 U.S.C. section 360bbb-3(b)(1), unless the authorization is terminated or revoked.  Performed at [facility], [address] [place], [place] [address]          Radiology Studies: CT ANGIO HEAD NECK W WO CM  Result Date: 07/22/2021 CLINICAL DATA:  Initial evaluation for neuro deficit, stroke suspected. EXAM: CT ANGIOGRAPHY HEAD AND NECK TECHNIQUE: Multidetector CT imaging of the head and neck was performed using the standard protocol during bolus administration of intravenous contrast. Multiplanar CT image reconstructions and MIPs were obtained to evaluate the vascular anatomy. Carotid stenosis measurements (when applicable) are obtained utilizing NASCET criteria, using the distal internal carotid diameter as the denominator. CONTRAST:  OMNIPAQUE IOHEXOL 350 MG/ML SOLN COMPARISON:  CT from 02/11/2021. FINDINGS: CT HEAD FINDINGS Brain: Moderately advanced cerebral atrophy with chronic small vessel ischemic disease. Multiple scattered remote lacunar infarcts present about the bilateral basal ganglia and thalami. Superimposed mineralization at the deep gray nuclei. No acute intracranial hemorrhage. No acute large vessel territory infarct. No mass lesion or midline shift. Ventricular  prominence related to global parenchymal volume loss of hydrocephalus. No extra-axial fluid collection. Vascular: No hyperdense vessel. Calcified atherosclerosis present at the skull base. Skull: Scalp soft tissues within normal limits.  Calvarium intact. Sinuses: Visualized paranasal sinuses and mastoid air cells are clear. Orbits: Globes and orbital soft tissues demonstrate no acute finding. Review of the MIP images confirms the above findings CTA NECK FINDINGS Aortic arch: Visualized aortic arch normal in caliber with normal branch pattern. Moderate to advanced atheromatous change about the arch and origin of the great vessels without high-grade stenosis. Right carotid system: Right common carotid artery occluded at its origin. Distal reconstitution at the right carotid bifurcation via collateral flow from the right external carotid  artery. Bulky calcified plaque at the right carotid bulb and origin of the right ICA with severe near occlusive stenosis (series 9, image 228). Right ICA attenuated but patent distally to the skull base. Left carotid system: Scattered atheromatous plaque throughout the left CCA without high-grade stenosis. Eccentric calcified plaque at the proximal left ICA with associated stenosis of up to approximately 50% by NASCET criteria. Left ICA tortuous but patent distally without stenosis. Vertebral arteries: Both vertebral arteries arise from subclavian arteries. 50% stenosis noted involving the proximal left subclavian artery prior to the takeoff of the left vertebral artery. Proximal right subclavian artery not well assessed due to adjacent venous contamination. Atheromatous change at the origins of both vertebral arteries with severe ostial stenoses. Additional atheromatous irregularity within the proximal left V1 and V2 segments with associated mild to moderate multifocal narrowing. Vertebral arteries otherwise patent to the skull base without stenosis or other acute abnormality. Skeleton: No discrete or worrisome osseous lesions. Moderate spondylosis at C5-6 and C6-7. Median sternotomy wires noted. Other neck: No other acute soft tissue abnormality within the neck. 1.7 cm soft tissue lesion present at the inferior right parotid gland (series 10, image 114), indeterminate. Upper chest: Scattered ground-glass opacity seen within the partially visualized lungs, favored to reflect pulmonary edema, although multifocal infection could be considered. Review of the MIP images confirms the above findings CTA HEAD FINDINGS Anterior circulation: /petrous segments patent bilaterally. Atheromatous change throughout the carotid siphons with associated severe stenoses at the supraclinoid segments bilaterally. A1 segments patent bilaterally. Right A1 slightly hypoplastic. Normal anterior communicating artery complex. Anterior  cerebral arteries patent to their distal aspects without stenosis. No M1 stenosis or occlusion. No proximal MCA branch occlusion. Distal MCA branches perfused and grossly symmetric. Posterior circulation: Both V4 segments patent to the vertebrobasilar junction without stenosis. Neither PICA origin well visualized on this exam. Basilar patent to its distal aspect without stenosis. Superior cerebral arteries patent bilaterally. Both PCAs primarily supplied via the basilar. Atheromatous change within the PCAs bilaterally, left greater than right, with associated moderate bilateral P2 stenoses. PCAs remain patent to their distal aspects. Venous sinuses: Not well assessed due to timing the contrast bolus. Anatomic variants: None significant. Review of the MIP images confirms the above findings IMPRESSION: 1. Negative CTA for emergent large vessel occlusion. 2. Chronic occlusion of the right common carotid artery at its origin with distal reconstitution at the right carotid bifurcation. Additional severe calcified stenosis at the right bifurcation. 3. Atheromatous change throughout the carotid siphons with associated severe bilateral supraclinoid stenoses. 4. Severe ostial stenoses at the origins of both vertebral arteries. 5. 1.7 cm soft tissue lesion at the inferior right parotid gland, indeterminate. Outpatient ENT referral for further workup and consultation suggested. 6. Scattered ground-glass opacity within the partially visualized lungs,  favored to reflect pulmonary edema, although multifocal infection could be considered in the correct clinical setting. Electronically Signed   By: Rise Mu M.D.   On: 07/22/2021 19:38   MR BRAIN WO CONTRAST  Result Date: 07/22/2021 CLINICAL DATA:  Initial evaluation for neuro deficit, stroke suspected. EXAM: MRI HEAD WITHOUT CONTRAST TECHNIQUE: Multiplanar, multiecho pulse sequences of the brain and surrounding structures were obtained without intravenous contrast.  COMPARISON:  Prior study from earlier the same day. FINDINGS: Brain: Diffuse prominence of the CSF containing spaces compatible with generalized cerebral atrophy. Extensive T2/FLAIR hyperintensity throughout the periventricular and deep white matter both cerebral hemispheres with involvement of the deep gray nuclei and brainstem, consistent with advanced chronic microvascular ischemic disease. Multiple scattered remote lacunar infarcts present about the hemispheric cerebral white matter, basal ganglia, thalami, and pons. 1 cm acute ischemic infarcts seen involving the periventricular white matter of the posterior left frontal corona radiata/centrum semi ovale (series 5, image 88). No associated hemorrhage or mass effect. Additional 6 mm focus of mild diffusion abnormality seen involving the right splenium (series 5, image 78), likely a focus of evolving subacute ischemia. No associated hemorrhage or mass effect. No other evidence for acute or subacute ischemia. Gray-white matter differentiation otherwise maintained. No acute intracranial hemorrhage. Chronic hemosiderin staining at the posterior left frontotemporal region again noted. Few additional chronic micro hemorrhages noted, likely small vessel related. No mass lesion, midline shift or mass effect. No hydrocephalus or extra-axial fluid collection. Pituitary gland suprasellar region within normal limits. Midline structures intact. Vascular: Major intracranial vascular flow voids are maintained. Skull and upper cervical spine: Craniocervical junction within normal limits. Bone marrow signal intensity normal. No scalp soft tissue abnormality. Sinuses/Orbits: Globes and orbital soft tissues demonstrate no acute finding. Paranasal sinuses are largely clear. No mastoid effusion. Inner ear structures grossly normal. Other: None. IMPRESSION: 1. 1 cm acute ischemic nonhemorrhagic periventricular white matter infarct involving the posterior left frontal corona  radiata/centrum semi ovale. 2. Additional 6 mm focus of mild diffusion abnormality involving the right splenium, likely a focus of evolving subacute ischemia. No associated hemorrhage or mass effect. 3. Underlying advanced chronic microvascular ischemic disease with multiple remote lacunar infarcts involving the hemispheric cerebral white matter, basal ganglia, thalami, and pons. Electronically Signed   By: Rise Mu M.D.   On: 07/22/2021 23:37   DG Chest Port 1 View  Result Date: 07/22/2021 CLINICAL DATA:  Weakness EXAM: PORTABLE CHEST 1 VIEW COMPARISON:  Portable exam at 2007 hrs compared to 02/11/2021 FINDINGS: Normal heart size post median sternotomy and CABG. Atherosclerotic calcification aorta. Mediastinal contours and pulmonary vascularity normal. Lungs clear. No infiltrate, pleural effusion, or pneumothorax. Bones demineralized. IMPRESSION: Post CABG. No acute abnormalities. Aortic Atherosclerosis (ICD10-I70.0). Electronically Signed   By: Ulyses Southward M.D.   On: 07/22/2021 20:14   ECHOCARDIOGRAM COMPLETE  Result Date: 07/23/2021    ECHOCARDIOGRAM REPORT   Patient Name:   Lindsey Leon Date of Exam: 07/23/2021 Medical Rec #:  [id number]        Height:       66.0 in Accession #:    [id number]       Weight:       170.0 lb Date of Birth:  November 29, 1955        BSA:          1.866 m Patient Age:    65 years         BP:           117/58  mmHg Patient Gender: F                HR:           58 bpm. Exam Location:  Inpatient Procedure: 2D Echo Indications:    stroke  History:        Patient has prior history of Echocardiogram examinations, most                 recent 01/02/2021. Prior CABG, HIV; Risk Factors:Hypertension and                 Diabetes.  Sonographer:    Delcie Roch RDCS Referring Phys: [id number] Cecille Po MELVIN IMPRESSIONS  1. Left ventricular ejection fraction, by estimation, is 60 to 65%. The left ventricle has normal function. The left ventricle has no regional wall motion  abnormalities. Left ventricular diastolic parameters are consistent with Grade I diastolic dysfunction (impaired relaxation). Elevated left atrial pressure.  2. Right ventricular systolic function is normal. The right ventricular size is normal.  3. The mitral valve is normal in structure. Mild to moderate mitral valve regurgitation. Moderate mitral annular calcification.  4. The aortic valve is tricuspid. There is mild calcification of the aortic valve. There is moderate thickening of the aortic valve. Aortic valve regurgitation is mild to moderate. Mild to moderate aortic valve sclerosis/calcification is present, without any evidence of aortic stenosis. Comparison(s): No significant change from prior study. Prior images reviewed side by side. FINDINGS  Left Ventricle: Left ventricular ejection fraction, by estimation, is 60 to 65%. The left ventricle has normal function. The left ventricle has no regional wall motion abnormalities. The left ventricular internal cavity size was normal in size. There is  no left ventricular hypertrophy. Left ventricular diastolic parameters are consistent with Grade I diastolic dysfunction (impaired relaxation). Elevated left atrial pressure. Right Ventricle: The right ventricular size is normal. No increase in right ventricular wall thickness. Right ventricular systolic function is normal. Left Atrium: Left atrial size was normal in size. Right Atrium: Right atrial size was normal in size. Pericardium: There is no evidence of pericardial effusion. Mitral Valve: The mitral valve is normal in structure. Moderate mitral annular calcification. Mild to moderate mitral valve regurgitation. Tricuspid Valve: The tricuspid valve is normal in structure. Tricuspid valve regurgitation is mild. Aortic Valve: The aortic valve is tricuspid. There is mild calcification of the aortic valve. There is moderate thickening of the aortic valve. Aortic valve regurgitation is mild to moderate. Aortic  regurgitation PHT measures 523 msec. Mild to moderate aortic valve sclerosis/calcification is present, without any evidence of aortic stenosis. Aortic valve mean gradient measures 8.5 mmHg. Aortic valve peak gradient measures 16.4 mmHg. Aortic valve area, by VTI measures 2.49 cm. Pulmonic Valve: The pulmonic valve was grossly normal. Pulmonic valve regurgitation is not visualized. Aorta: The aortic root and ascending aorta are structurally normal, with no evidence of dilitation. IAS/Shunts: No atrial level shunt detected by color flow Doppler.  LEFT VENTRICLE PLAX 2D LVIDd:         4.30 cm   Diastology LVIDs:         2.90 cm   LV e' medial:    5.44 cm/s LV PW:         1.00 cm   LV E/e' medial:  18.8 LV IVS:        0.80 cm   LV e' lateral:   6.64 cm/s LVOT diam:     2.04 cm   LV E/e' lateral: 15.4  LV SV:         106 LV SV Index:   57 LVOT Area:     3.27 cm  RIGHT VENTRICLE            IVC RV S prime:     9.14 cm/s  IVC diam: 1.70 cm TAPSE (M-mode): 1.4 cm LEFT ATRIUM             Index        RIGHT ATRIUM          Index LA diam:        3.50 cm 1.88 cm/m   RA Area:     9.64 cm LA Vol (A2C):   36.9 ml 19.77 ml/m  RA Volume:   18.10 ml 9.70 ml/m LA Vol (A4C):   37.0 ml 19.82 ml/m LA Biplane Vol: 37.3 ml 19.99 ml/m  AORTIC VALVE AV Area (Vmax):    2.40 cm AV Area (Vmean):   2.22 cm AV Area (VTI):     2.49 cm AV Vmax:           202.50 cm/s AV Vmean:          132.000 cm/s AV VTI:            0.425 m AV Peak Grad:      16.4 mmHg AV Mean Grad:      8.5 mmHg LVOT Vmax:         149.00 cm/s LVOT Vmean:        89.600 cm/s LVOT VTI:          0.324 m LVOT/AV VTI ratio: 0.76 AI PHT:            523 msec  AORTA Ao Root diam: 2.70 cm Ao Asc diam:  3.20 cm MITRAL VALVE                TRICUSPID VALVE MV Area (PHT): 2.87 cm     TR Peak grad:   33.4 mmHg MV Decel Time: 264 msec     TR Vmax:        289.00 cm/s MR Peak grad: 135.0 mmHg MR Mean grad: 96.0 mmHg     SHUNTS MR Vmax:      581.00 cm/s   Systemic VTI:  0.32 m MR Vmean:      476.0 cm/s    Systemic Diam: 2.04 cm MV E velocity: 102.00 cm/s MV A velocity: 130.00 cm/s MV E/A ratio:  0.78 Mihai Croitoru MD Electronically signed by Thurmon Fair MD Signature Date/Time: 07/23/2021/12:20:35 PM    Final         Scheduled Meds:  aspirin EC  81 mg Oral Daily   atorvastatin  10 mg Oral q morning   bictegravir-emtricitabine-tenofovir AF  1 tablet Oral q morning   clopidogrel  75 mg Oral Daily   donepezil  5 mg Oral QHS   famotidine  20 mg Oral q morning   gabapentin  300 mg Oral q morning   gabapentin  900 mg Oral QHS   insulin aspart  0-15 Units Subcutaneous TID WC   insulin glargine-yfgn  8 Units Subcutaneous QHS   Continuous Infusions:   LOS: 1 day    Time spent: 25 minutes    Berton Mount, MD  [facility] Pager #: [phone] 7PM-7AM contact night coverage as above

## 2021-07-25 DIAGNOSIS — I639 Cerebral infarction, unspecified: Secondary | ICD-10-CM

## 2021-07-25 DIAGNOSIS — I6302 Cerebral infarction due to thrombosis of basilar artery: Secondary | ICD-10-CM | POA: Diagnosis not present

## 2021-07-25 DIAGNOSIS — I10 Essential (primary) hypertension: Secondary | ICD-10-CM | POA: Diagnosis not present

## 2021-07-25 DIAGNOSIS — I672 Cerebral atherosclerosis: Secondary | ICD-10-CM | POA: Diagnosis not present

## 2021-07-25 DIAGNOSIS — I6521 Occlusion and stenosis of right carotid artery: Secondary | ICD-10-CM

## 2021-07-25 DIAGNOSIS — R29818 Other symptoms and signs involving the nervous system: Secondary | ICD-10-CM | POA: Diagnosis not present

## 2021-07-25 LAB — GLUCOSE, CAPILLARY
Glucose-Capillary: 145 mg/dL — ABNORMAL HIGH (ref 70–99)
Glucose-Capillary: 144 mg/dL — ABNORMAL HIGH (ref 70–99)
Glucose-Capillary: 117 mg/dL — ABNORMAL HIGH (ref 70–99)
Glucose-Capillary: 120 mg/dL — ABNORMAL HIGH (ref 70–99)

## 2021-07-25 MED ORDER — TICAGRELOR 90 MG PO TABS
90.0000 mg | ORAL_TABLET | Freq: Two times a day (BID) | ORAL | Status: DC
  Administered 2021-07-25 – 2021-07-26 (×2): 90 mg via ORAL
  Filled 2021-07-25 (×2): qty 1

## 2021-07-25 MED ORDER — LOSARTAN POTASSIUM 50 MG PO TABS
100.0000 mg | ORAL_TABLET | Freq: Every day | ORAL | Status: DC
  Administered 2021-07-25 – 2021-07-26 (×2): 100 mg via ORAL
  Filled 2021-07-25 (×2): qty 2

## 2021-07-25 NOTE — Consult Note (Signed)
Neurology Consultation  Reason for Consult: finding of stroke on MRIb.  Referring Physician:   CC: Lanae Boast, MD.   History is obtained from: chart notes.   HPI: Lindsey Leon is a 65 y.o. female with a PMHx of HIV, dementia, ETOH abuse, CVA, CAD s/p CABG, chronic Hep C, and HTN who presented to ED 3 days ago from her SNF with right sided weakness which started 07/21/21 around 1300 hours. This interfered with her ability to walk and bear weight.   She has sequelae of right sided weakness from her previous stroke. She had bradycardia which may have contributed to her being prone to ischemic insults.    Workup thus far: CTA head and neck negative for new LVO, but positive chronic carotid artery disease, cerebrovascular disease, stenosis of BVAs, and a parotid lesion which will need out patient ENT workup. CBGs 102-145, LDL 39, PT/INR 14.1/1.1, aPTT 32, HgA1c 6.3%, negative UDS, Ethanol level < 10 and echocardiogram with preserved LVEF, Grade I diastolic dysfunction and without atrial level shunt. MRI as below.   In 12/2020, patient had a stroke and waa put on ASA and Brilinta 90mg  po bid x 30 days then Plavix alone.   In review of chart, last f/up visit with Dr. , was uneventful except for memory loss and her labs showed no reversible cause. She was placed on Aricept at that visit. Note from visit:  "65 year old lady with pontine and left subcortical white matter infarcts in March 2022 likely from small vessel disease who also has significant known chronic Multivessel extracranial large vessel disease also.  Remote history of left temporal intracerebral hemorrhage in 2015 .vascular risk factors of diabetes, hypertension, hyperlipidemia, large vessel extracranial multivessel stenosis.  She is also complaining of memory loss and mild cognitive impairment poststroke."  Neurology asked to consult due to ischemic stroke.   ROS: A robust ROS was performed and is negative except as noted in the  HPI.   Past Medical History:  Diagnosis Date   Chronic hepatitis C (HCC)    dx'ed in 12/2018   Dementia [facility])    Diverticulitis    HIV infection (HCC)    dx'ed in 12/2018   Hypertension    not on medications   Pancreatitis    Stroke [facility])    Family History  Problem Relation Age of Onset   Healthy Father    Cancer Mother        passed away. unknown age   Hypertension Sister    Diabetes Mellitus II Sister    Social History:   reports that she quit smoking about 6 years ago. Her smoking use included cigarettes. She has a 15.00 pack-year smoking history. She has never used smokeless tobacco. She reports that she does not currently use drugs after having used the following drugs: Marijuana. She reports that she does not drink alcohol.  Medications  Current Facility-Administered Medications:    acetaminophen (TYLENOL) tablet 650 mg, 650 mg, Oral, Q4H PRN **OR** acetaminophen (TYLENOL) 160 MG/5ML solution 650 mg, 650 mg, Per Tube, Q4H PRN **OR** acetaminophen (TYLENOL) suppository 650 mg, 650 mg, Rectal, Q4H PRN, [facility], MD   albuterol (PROVENTIL) (2.5 MG/3ML) 0.083% nebulizer solution 3 mL, 3 mL, Inhalation, Q8H PRN, Synetta Fail, MD   [COMPLETED] aspirin tablet 325 mg, 325 mg, Oral, Once, 325 mg at 07/22/21 2153 **FOLLOWED BY** aspirin EC tablet 81 mg, 81 mg, Oral, Daily, 2154, MD, 81 mg at 07/25/21 1016   atorvastatin (LIPITOR)  tablet 10 mg, 10 mg, Oral, q morning, Synetta Fail, MD, 10 mg at 07/25/21 1021   bictegravir-emtricitabine-tenofovir AF (BIKTARVY) 50-200-25 MG per tablet 1 tablet, 1 tablet, Oral, q morning, Synetta Fail, MD, 1 tablet at 07/25/21 1022   calcium carbonate (TUMS - dosed in mg elemental calcium) chewable tablet 400 mg of elemental calcium, 2 tablet, Oral, Daily PRN, Synetta Fail, MD   clopidogrel (PLAVIX) tablet 75 mg, 75 mg, Oral, Daily, Synetta Fail, MD, 75 mg at 07/25/21 1016   donepezil (ARICEPT)  tablet 5 mg, 5 mg, Oral, QHS, Synetta Fail, MD, 5 mg at 07/24/21 2200   famotidine (PEPCID) tablet 20 mg, 20 mg, Oral, q morning, Synetta Fail, MD, 20 mg at 07/25/21 1022   gabapentin (NEURONTIN) capsule 300 mg, 300 mg, Oral, q morning, Synetta Fail, MD, 300 mg at 07/25/21 1022   gabapentin (NEURONTIN) capsule 900 mg, 900 mg, Oral, QHS, Cathie Hoops, RPH, 900 mg at 07/24/21 2201   insulin aspart (novoLOG) injection 0-15 Units, 0-15 Units, Subcutaneous, TID WC, Synetta Fail, MD, 2 Units at 07/25/21 1328   insulin glargine-yfgn [facility]) injection 8 Units, 8 Units, Subcutaneous, QHS, Synetta Fail, MD, 8 Units at 07/24/21 2201   losartan (COZAAR) tablet 100 mg, 100 mg, Oral, Q1200, Kc, Ramesh, MD, 100 mg at 07/25/21 1327   polyethylene glycol (MIRALAX / GLYCOLAX) packet 17 g, 17 g, Oral, Daily PRN, Synetta Fail, MD   Exam: Current vital signs: BP 125/61 (BP Location: Left Arm)   Pulse 81   Temp 98.6 F (37 C) (Oral)   Resp 14   Ht 5\' 6"  (1.676 m)   Wt 77.1 kg   SpO2 97%   BMI 27.44 kg/m  Vital signs in last 24 hours: Temp:  [98.2 F (36.8 C)-98.8 F (37.1 C)] 98.6 F (37 C) (10/10 1603) Pulse Rate:  [70-85] 81 (10/10 1603) Resp:  [14-20] 14 (10/10 1603) BP: (125-146)/(56-62) 125/61 (10/10 1603) SpO2:  [95 %-99 %] 97 % (10/10 1603)  PE: GENERAL: Chronically ill appearing. Awake, alert in NAD.  HEENT: normocephalic and atraumatic. LUNGS - Normal respiratory effort.  CV - RRR on tele. ABDOMEN - Soft, nontender. Ext: warm, well perfused. Psych: affect light.   NEURO:  Mental Status: Alert and oriented to self, age, place and year.  Disoriented to month, date, day of week.  Speech/Language: speech is without dysarthria or aphasia.  Follows commands.   Cranial Nerves:  II: PERRL. visual fields full.  III, IV, VI: EOMI. Lid elevation symmetric and full.  V: sensation is intact and symmetrical to face.  VII: Smile is symmetrical.    VIII:hearing intact to voice. IX, X: palate elevation is symmetric. Phonation normal.  XI: normal sternocleidomastoid and trapezius muscle strength. is symmetrical without fasciculations.   Motor:  RUE: grips  4/5       triceps 4/5     biceps   4/5      LUE: 5/5 throughout.  RLE:  knee  4/5    thigh  4/5      plantar flexion  4/5    dorsiflexion   4/5 LLE:  5/5 throughout.  Tone is normal. Bulk is normal.  Sensation- Intact to light touch bilaterally in all four extremities.  Coordination: FTN intact bilaterally, slowed on right. Could not perform HKS.  Gait- deferred. PT recommendation for 24 hour supervision and assistance.   Labs I have reviewed labs in epic and the  results pertinent to this consultation are listed in HPI.   Imaging MD reviewed the images obtained.  CTA head and neck -Negative CTA for emergent large vessel occlusion. 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 Additional severe calcified stenosis at the right bifurcation. -Atheromatous change throughout the carotid siphons with associated severe bilateral supraclinoid stenoses. -Severe ostial stenoses at the origins of both vertebral arteries. -1.7 cm soft tissue lesion at the inferior right parotid gland, indeterminate. Outpatient ENT referral for further workup and consultation suggested. -Scattered ground-glass opacity within the partially visualized lungs, favored to reflect pulmonary edema, although multifocal infection could be considered in the correct clinical setting.    MRI brain -1 cm acute ischemic nonhemorrhagic periventricular white matter infarct involving the posterior left frontal corona radiata/centrum semi ovale. -Additional 6 mm focus of mild diffusion abnormality involving the right splenium, likely a focus of evolving subacute ischemia. No associated hemorrhage or mass  effect. -Underlying advanced chronic microvascular ischemic disease with multiple remote lacunar infarcts involving the hemispheric cerebral white matter, basal ganglia, thalami, and pons.  Assessment: 65 yo chronically ill female with stroke risk factors of DM II, HTN, old strokes, advanced chronic microvascular ischemic disease, and severe atherosclerotic disease of vasculature of brain and neck.   Impression: -New acute ischemic infarct.  -Known severe intracranial and extracranial stenosis, atherosclerosis, with chronic occlusion of RICA.  Recommendations/Plan:  -Change DAPT to Brilinta 90mg  po bid x 30 days.  -ASA 81mg  with Brilinta x 30 days.  -After 30 days, patient should go back to Plavix 75mg  po qd alone.   -out of window for permissive HTN. Goal is < 130/90.  -Her DM is well controlled. Goal < 7%.  -LDL is controlled and under 70. Continue low dose Atorvastatin.  -disposition per PT/OT.  -outpatient f/up with Dr. at [facility] in 4 weeks. Referral placed.   Neurology will be available for questions.   Pt seen by , NP/Neuro and MD. Note/plan to be edited by MD as needed.  Pager: Pearlean Brownie

## 2021-07-25 NOTE — Progress Notes (Signed)
Physical Therapy Treatment Patient Details Name: Lindsey Leon MRN: [id number] DOB: 1956/09/28 Today's Date: 07/25/2021   History of Present Illness Pt is a 65 y.o. female who presented 07/22/21 with increased R sided weakness. Outside window for TPA. MRI of brain revealed acute infarct in posterior L frontal corona radiata/centrum semi ovale and additional abnormality involving the R splenium, likely a focus of evolving subacute ischemia and multiple remote lacunar infarcts involving the hemispheric cerebral white matter, basal ganglia, thalami, and pons. PMH: CAD status post CABG, HOCM, well-controlled HIV, chronic hep C, hypertension, dementia, pancreatitis, insulin-dependent diabetes, stroke.    PT Comments    Pt received in supine, agreeable to therapy session and with good participation and tolerance for seated and standing exercises. Pt needing increased trunk support for dynamic seated tasks and performed multiple seated UE/LE exercises and weight shifting activities for core/general strengthening with postural focus. Pt needing maxA for standing transfer at bedside and tearful due to fear of falls, unable to progress dynamic standing tasks this session. Pt continues to benefit from PT services to progress toward functional mobility goals.    Recommendations for follow up therapy are one component of a multi-disciplinary discharge planning process, led by the attending physician.  Recommendations may be updated based on patient status, additional functional criteria and insurance authorization.  Follow Up Recommendations  SNF;Supervision/Assistance - 24 hour     Equipment Recommendations  Other (comment) (defer to post-acute)    Recommendations for Other Services       Precautions / Restrictions Precautions Precautions: Fall Precaution Comments: R hemiparesis, fear of falls Restrictions Weight Bearing Restrictions: No     Mobility  Bed Mobility Overal bed mobility: Needs  Assistance Bed Mobility: Supine to Sit;Sit to Supine     Supine to sit: Mod assist;HOB elevated Sit to supine: Mod assist   General bed mobility comments: Cues provided to bring legs off L edge of bed, pt having difficulty with managing R leg and lifting trunk. ModA to pivot buttocks and ascend trunk but pt able to initiate with cues by pulling on bed rails. Mod A to manage legs and trunk back to supine.    Transfers Overall transfer level: Needs assistance Equipment used: 1 person hand held assist Transfers: Sit to/from Stand Sit to Stand: Max assist         General transfer comment: Provided R knee block and boost under buttocks to extend hips, maxA to stand from bed height with BUE supported on back of sturdy chair. cues provided for upright posture and forward gaze; pt anxious/a little tearful when sitting back down due to fear of falls.  Ambulation/Gait             General Gait Details: pt tearful with standing and unable to initiate stepping, lack of +2 assist at time of session   Stairs             Wheelchair Mobility    Modified Rankin (Stroke Patients Only) Modified Rankin (Stroke Patients Only) Pre-Morbid Rankin Score: Moderate disability Modified Rankin: Severe disability     Balance Overall balance assessment: Needs assistance Sitting-balance support: Single extremity supported;No upper extremity supported;Feet supported Sitting balance-Leahy Scale: Poor Sitting balance - Comments: Pt with L lateral lean, needing min guard-minA to maintain static sitting balance and delayed righting reactions, pt able to slightly lift knees to prevent posterior LOB but multiple mild LOB during dynamic seated/reaching activities Postural control: Posterior lean (L and R truncal LOB) Standing balance support: Bilateral upper  extremity supported Standing balance-Leahy Scale: Zero Standing balance comment: maxA for static standing, unable to assess weight shifting                             Cognition Arousal/Alertness: Awake/alert Behavior During Therapy: WFL for tasks assessed/performed Overall Cognitive Status: History of cognitive impairments - at baseline                                 General Comments: Pt with documented h/o memory loss.  She was oriented to year and to city. She is slow to respond to questions and expressed anxiety after standing/a little tearful but denies that this is due to pain, more just fear of falls.      Exercises Other Exercises Other Exercises: seated BUE AROM: shoulder shrugs, BUE pushing/pulling (AA on RUE, AROM on LUE) x10 reps ea Other Exercises: seated lateral leans with elbow taps and anterior reaching/weight shifting with U UE support x5 mins Other Exercises: STS x 1 rep for BLE strengthening Other Exercises: seated BLE AROM: LAQ, hip flexion, ankle pumps    General Comments General comments (skin integrity, edema, etc.): Pt with dry skin on back, c/o feeling hungry but no dinner arrived yet, RN/NT notified; VSS on RA per monitor      Pertinent Vitals/Pain Pain Assessment: No/denies pain Pain Intervention(s): Monitored during session;Repositioned    Home Living                      Prior Function            PT Goals (current goals can now be found in the care plan section) Acute Rehab PT Goals Patient Stated Goal: to get back to normal, go back to [place]. PT Goal Formulation: With patient Time For Goal Achievement: 08/06/21 Potential to Achieve Goals: Fair Progress towards PT goals: Progressing toward goals (slow progress)    Frequency    Min 3X/week      PT Plan Current plan remains appropriate    Co-evaluation              AM-PAC PT "6 Clicks" Mobility   Outcome Measure  Help needed turning from your back to your side while in a flat bed without using bedrails?: A Little Help needed moving from lying on your back to sitting on the side of  a flat bed without using bedrails?: A Lot Help needed moving to and from a bed to a chair (including a wheelchair)?: A Lot Help needed standing up from a chair using your arms (e.g., wheelchair or bedside chair)?: A Lot Help needed to walk in hospital room?: Total Help needed climbing 3-5 steps with a railing? : Total 6 Click Score: 11    End of Session Equipment Utilized During Treatment: Gait belt Activity Tolerance: Patient tolerated treatment well Patient left: in bed;with call bell/phone within reach;with bed alarm set;with SCD's reapplied (bed in chair position) Nurse Communication: Mobility status;Other (comment) (pt needs assistance ordering dinner) PT Visit Diagnosis: Unsteadiness on feet (R26.81);Other abnormalities of gait and mobility (R26.89);Muscle weakness (generalized) (M62.81);Difficulty in walking, not elsewhere classified (R26.2);Other symptoms and signs involving the nervous system (R29.898);Hemiplegia and hemiparesis Hemiplegia - Right/Left: Right Hemiplegia - dominant/non-dominant: Dominant Hemiplegia - caused by: Cerebral infarction     Time: 7185-5015 PT Time Calculation (min) (ACUTE ONLY): 21 min  Charges:  $Therapeutic Exercise: 8-22  mins                     Florina Ou., PTA Acute Rehabilitation Services Pager: [phone] Office: [phone]    Angus Palms 07/25/2021, 6:59 PM

## 2021-07-25 NOTE — Progress Notes (Signed)
PROGRESS NOTE    Lindsey Leon  XQJ:[id number] DOB: 1955-11-17 DOA: 07/22/2021 PCP: Karna Dupes, MD   Chief Complaint  Patient presents with   Weakness   Brief Narrative/Hospital Course: 65 year old female with history of CVA significant alcohol use, carotid artery disease chronic hepatitis C, dementia/poor historian from facility,hypertension,HIV CAD status post CABG,DM presented with right-sided weakness starting 07/21/2021 afternoon around 1 PM feeling tired. Patient was seen in the ED CT head/CTA head and neck no LVO,chronic carotid artery disease-chronic occlusion of right common carotid artery, severe calcified stenosis in the right bifurcation, severe ostial stenosis of both vertebral arteries, 1.7 cm soft tissue lesion in the right parotid gland  Subjective: Seen examined Poor historian No complains endorses weakness on rt side  Assessment & Plan:  Right-sided weakness difficulty ambulation Acute ischemic periventricular white matter infarct 1 cm of left frontal corona radiata plan/centrum semiovale: Patient admitted completed stroke work-up CTA head and neck no LVO, has chronic carotid artery occlusion as well as 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  Previous history of stroke.  Echocardiogram negative findings, HbA1c 6.3, LDL 39 target less than 70.  Continue monitoring telemetry continue PT OT speech.  I have notified neurology  this am - spoke w/ Dr Thomasena Edis for consult. Continue aspirin 81, Plavix and Lipitor at 10 mg.  HIV: Recent CD4 9/14 681, viral load 101 (high). Continue his Biktarvy. Advise follow-up with his ID.  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  Dementia: Supportive care delirium precaution fall precaution.  Continue Aricept  Chronic hepatitis C without hepatic coma OP f/u  Type 2 diabetes mellitus with hyperglycemia well controlled HbA1c.   Continue long-acting insulin,/SI. Recent Labs  Lab 07/23/21 2309 07/24/21 0646 07/24/21 1146 07/24/21 1538 07/25/21 0735  GLUCAP 103* 132* 138* 127* 145*    HTN: Fairly controlled.  Resume his losartan .  1.7 cm soft tissue lesion in the right parotid gland: Will need ENT follow-up  Scattered groundglass opacity in the partially visualized lung-chest x-ray no acute finding, not symptomatic.  Follow-up outpatient  DVT prophylaxis: SCD's Start: 07/22/21 2105 Code Status:   Code Status: Full Code Family Communication: plan of care discussed with patient at bedside. Status is: Inpatient  Remains inpatient appropriate because:Inpatient level of care appropriate due to severity of illness  Dispo: The patient is from:  [place] long-term placement              Anticipated d/c is to:  Same aove with short term SNf              Patient currently is not medically stable for D/C. Pending neurology eval.   Difficult to place patient No  Objective: Vitals: Today's Vitals   07/24/21 1645 07/24/21 2045 07/24/21 2351 07/25/21 0446  BP:  133/60 (!) 142/56 (!) 132/57  Pulse:  85 76   Resp:  20  17  Temp:  98.5 F (36.9 C) 98.3 F (36.8 C) 98.8 F (37.1 C)  TempSrc:  Oral Axillary Axillary  SpO2:  95% 96% 98%  Weight:      Height:      PainSc: 0-No pain      Physical Examination: General exam:AAO x 2-3,weak,older than stated age. HEENT:Oral mucosa moist,Ear/Nose WNL grossly,dentition normal. Respiratory system:B/l clear BS, no use of accessory muscle, non tender. Cardiovascular system:S1 & S2 +,No JVD. Gastrointestinal system:Abdomen soft, NT,ND, BS+. Nervous System:Alert, awake weakness in RUE/RLE more than. Moving extremities.  Extremities: edema none,distal peripheral pulses palpable.  Skin: No rashes, no icterus. MSK: Normal muscle bulk, tone, power.  Medications reviewed:  Scheduled Meds:  aspirin EC  81 mg Oral Daily   atorvastatin  10 mg Oral q morning    bictegravir-emtricitabine-tenofovir AF  1 tablet Oral q morning   clopidogrel  75 mg Oral Daily   donepezil  5 mg Oral QHS   famotidine  20 mg Oral q morning   gabapentin  300 mg Oral q morning   gabapentin  900 mg Oral QHS   insulin aspart  0-15 Units Subcutaneous TID WC   insulin glargine-yfgn  8 Units Subcutaneous QHS   Continuous Infusions:  FEN: Diet Order             Diet heart healthy/carb modified Room service appropriate? Yes; Fluid consistency: Thin  Diet effective now                 Intake/Output  Intake/Output Summary (Last 24 hours) at 07/25/2021 0736 Last data filed at 07/24/2021 2358 Gross per 24 hour  Intake 120 ml  Output 400 ml  Net -280 ml   Intake/Output from previous day: 10/09 0701 - 10/10 0700 In: 120 [P.O.:120] Out: 400 [Urine:400] Net IO Since Admission: -140 mL [07/25/21 0736]   Weight change:   Wt Readings from Last 3 Encounters:  07/22/21 77.1 kg  07/06/21 77.8 kg  06/29/21 76.2 kg     Consultants:see note  Procedures:see note Antimicrobials: Anti-infectives (From admission, onward)    Start     Dose/Rate Route Frequency Ordered Stop   07/23/21 1000  bictegravir-emtricitabine-tenofovir AF (BIKTARVY) 50-200-25 MG per tablet 1 tablet        1 tablet Oral Every morning 07/22/21 2109        Culture/Microbiology    Component Value Date/Time   SDES URINE, RANDOM 01/14/2021 1013   SPECREQUEST NONE 01/14/2021 1013   CULT (A) 01/14/2021 1013    <10,000 COLONIES/mL INSIGNIFICANT GROWTH Performed at [facility], [address] [place], [place] [address]    REPTSTATUS 01/15/2021 FINAL 01/14/2021 1013    Other culture-see note  Unresulted Labs (From admission, onward)    None     Data Reviewed: I have personally reviewed following labs and imaging studies CBC: Recent Labs  Lab 07/22/21 1656 07/22/21 1718  WBC 7.5  --   NEUTROABS 4.1  --   HGB 12.4 11.9*  HCT 36.8 35.0*  MCV 98.9  --   PLT 238  --    Basic  Metabolic Panel: Recent Labs  Lab 07/22/21 1656 07/22/21 1718  NA 141 142  K 3.6 3.6  CL 105 105  CO2 25  --   GLUCOSE 113* 113*  BUN 18 19  CREATININE 1.15* 1.00  CALCIUM 9.7  --    GFR: Estimated Creatinine Clearance: 58.8 mL/min (by C-G formula based on SCr of 1 mg/dL). Liver Function Tests: Recent Labs  Lab 07/22/21 1656  AST 20  ALT 17  ALKPHOS 42  BILITOT 0.6  PROT 7.2  ALBUMIN 4.2   No results for input(s): LIPASE, AMYLASE in the last 168 hours. No results for input(s): AMMONIA in the last 168 hours. Coagulation Profile: Recent Labs  Lab 07/22/21 1656  INR 1.1   Cardiac Enzymes: No results for input(s): CKTOTAL, CKMB, CKMBINDEX, TROPONINI in the last 168 hours. BNP (last 3 results) No results for input(s): PROBNP in the last 8760 hours. HbA1C: Recent Labs    07/23/21 0336  HGBA1C 6.3*   CBG: Recent Labs  Lab 07/23/21 1640 07/23/21 2309 07/24/21 0646 07/24/21 1146 07/24/21 1538  GLUCAP 104* 103* 132* 138* 127*   Lipid Profile: Recent Labs    07/23/21 0337  CHOL 103  HDL 33*  LDLCALC 39  TRIG 301*  CHOLHDL 3.1   Thyroid Function Tests: No results for input(s): TSH, T4TOTAL, FREET4, T3FREE, THYROIDAB in the last 72 hours. Anemia Panel: No results for input(s): VITAMINB12, FOLATE, FERRITIN, TIBC, IRON, RETICCTPCT in the last 72 hours. Sepsis Labs: No results for input(s): PROCALCITON, LATICACIDVEN in the last 168 hours.  Recent Results (from the past 240 hour(s))  Resp Panel by RT-PCR (Flu A&B, Covid) Nasopharyngeal Swab     Status: None   Collection Time: 07/22/21  3:46 PM   Specimen: Nasopharyngeal Swab; Nasopharyngeal(NP) swabs in vial transport medium  Result Value Ref Range Status   SARS Coronavirus 2 by RT PCR NEGATIVE NEGATIVE Final    Comment: (NOTE) SARS-CoV-2 target nucleic acids are NOT DETECTED.  The SARS-CoV-2 RNA is generally detectable in upper respiratory specimens during the acute phase of infection. The  lowest concentration of SARS-CoV-2 viral copies this assay can detect is 138 copies/mL. A negative result does not preclude SARS-Cov-2 infection and should not be used as the sole basis for treatment or other patient management decisions. A negative result may occur with  improper specimen collection/handling, submission of specimen other than nasopharyngeal swab, presence of viral mutation(s) within the areas targeted by this assay, and inadequate number of viral copies(<138 copies/mL). A negative result must be combined with clinical observations, patient history, and epidemiological information. The expected result is Negative.  Fact Sheet for Patients:  BloggerCourse.com  Fact Sheet for Healthcare Providers:  SeriousBroker.it  This test is no t yet approved or cleared by the [place] FDA and  has been authorized for detection and/or diagnosis of SARS-CoV-2 by FDA under an Emergency Use Authorization (EUA). This EUA will remain  in effect (meaning this test can be used) for the duration of the COVID-19 declaration under Section 564(b)(1) of the Act, 21 U.S.C.section 360bbb-3(b)(1), unless the authorization is terminated  or revoked sooner.       Influenza A by PCR NEGATIVE NEGATIVE Final   Influenza B by PCR NEGATIVE NEGATIVE Final    Comment: (NOTE) The Xpert Xpress SARS-CoV-2/FLU/RSV plus assay is intended as an aid in the diagnosis of influenza from Nasopharyngeal swab specimens and should not be used as a sole basis for treatment. Nasal washings and aspirates are unacceptable for Xpert Xpress SARS-CoV-2/FLU/RSV testing.  Fact Sheet for Patients: BloggerCourse.com  Fact Sheet for Healthcare Providers: SeriousBroker.it  This test is not yet approved or cleared by the [place] FDA and has been authorized for detection and/or diagnosis of SARS-CoV-2 by FDA under  an Emergency Use Authorization (EUA). This EUA will remain in effect (meaning this test can be used) for the duration of the COVID-19 declaration under Section 564(b)(1) of the Act, 21 U.S.C. section 360bbb-3(b)(1), unless the authorization is terminated or revoked.  Performed at [facility], [address] [place], [place] [address]      Radiology Studies: ECHOCARDIOGRAM COMPLETE  Result Date: 07/23/2021    ECHOCARDIOGRAM REPORT   Patient Name:   Lindsey Leon Date of Exam: 07/23/2021 Medical Rec #:  [id number]        Height:       66.0 in Accession #:    [id number]       Weight:  170.0 lb Date of Birth:  1956/06/26        BSA:          1.866 m Patient Age:    65 years         BP:           117/58 mmHg Patient Gender: F                HR:           58 bpm. Exam Location:  Inpatient Procedure: 2D Echo Indications:    stroke  History:        Patient has prior history of Echocardiogram examinations, most                 recent 01/02/2021. Prior CABG, HIV; Risk Factors:Hypertension and                 Diabetes.  Sonographer:    Delcie Roch RDCS Referring Phys: [id number] Cecille Po MELVIN IMPRESSIONS  1. Left ventricular ejection fraction, by estimation, is 60 to 65%. The left ventricle has normal function. The left ventricle has no regional wall motion abnormalities. Left ventricular diastolic parameters are consistent with Grade I diastolic dysfunction (impaired relaxation). Elevated left atrial pressure.  2. Right ventricular systolic function is normal. The right ventricular size is normal.  3. The mitral valve is normal in structure. Mild to moderate mitral valve regurgitation. Moderate mitral annular calcification.  4. The aortic valve is tricuspid. There is mild calcification of the aortic valve. There is moderate thickening of the aortic valve. Aortic valve regurgitation is mild to moderate. Mild to moderate aortic valve sclerosis/calcification is present, without any evidence of  aortic stenosis. Comparison(s): No significant change from prior study. Prior images reviewed side by side. FINDINGS  Left Ventricle: Left ventricular ejection fraction, by estimation, is 60 to 65%. The left ventricle has normal function. The left ventricle has no regional wall motion abnormalities. The left ventricular internal cavity size was normal in size. There is  no left ventricular hypertrophy. Left ventricular diastolic parameters are consistent with Grade I diastolic dysfunction (impaired relaxation). Elevated left atrial pressure. Right Ventricle: The right ventricular size is normal. No increase in right ventricular wall thickness. Right ventricular systolic function is normal. Left Atrium: Left atrial size was normal in size. Right Atrium: Right atrial size was normal in size. Pericardium: There is no evidence of pericardial effusion. Mitral Valve: The mitral valve is normal in structure. Moderate mitral annular calcification. Mild to moderate mitral valve regurgitation. Tricuspid Valve: The tricuspid valve is normal in structure. Tricuspid valve regurgitation is mild. Aortic Valve: The aortic valve is tricuspid. There is mild calcification of the aortic valve. There is moderate thickening of the aortic valve. Aortic valve regurgitation is mild to moderate. Aortic regurgitation PHT measures 523 msec. Mild to moderate aortic valve sclerosis/calcification is present, without any evidence of aortic stenosis. Aortic valve mean gradient measures 8.5 mmHg. Aortic valve peak gradient measures 16.4 mmHg. Aortic valve area, by VTI measures 2.49 cm. Pulmonic Valve: The pulmonic valve was grossly normal. Pulmonic valve regurgitation is not visualized. Aorta: The aortic root and ascending aorta are structurally normal, with no evidence of dilitation. IAS/Shunts: No atrial level shunt detected by color flow Doppler.  LEFT VENTRICLE PLAX 2D LVIDd:         4.30 cm   Diastology LVIDs:         2.90 cm   LV e' medial:     5.44 cm/s  LV PW:         1.00 cm   LV E/e' medial:  18.8 LV IVS:        0.80 cm   LV e' lateral:   6.64 cm/s LVOT diam:     2.04 cm   LV E/e' lateral: 15.4 LV SV:         106 LV SV Index:   57 LVOT Area:     3.27 cm  RIGHT VENTRICLE            IVC RV S prime:     9.14 cm/s  IVC diam: 1.70 cm TAPSE (M-mode): 1.4 cm LEFT ATRIUM             Index        RIGHT ATRIUM          Index LA diam:        3.50 cm 1.88 cm/m   RA Area:     9.64 cm LA Vol (A2C):   36.9 ml 19.77 ml/m  RA Volume:   18.10 ml 9.70 ml/m LA Vol (A4C):   37.0 ml 19.82 ml/m LA Biplane Vol: 37.3 ml 19.99 ml/m  AORTIC VALVE AV Area (Vmax):    2.40 cm AV Area (Vmean):   2.22 cm AV Area (VTI):     2.49 cm AV Vmax:           202.50 cm/s AV Vmean:          132.000 cm/s AV VTI:            0.425 m AV Peak Grad:      16.4 mmHg AV Mean Grad:      8.5 mmHg LVOT Vmax:         149.00 cm/s LVOT Vmean:        89.600 cm/s LVOT VTI:          0.324 m LVOT/AV VTI ratio: 0.76 AI PHT:            523 msec  AORTA Ao Root diam: 2.70 cm Ao Asc diam:  3.20 cm MITRAL VALVE                TRICUSPID VALVE MV Area (PHT): 2.87 cm     TR Peak grad:   33.4 mmHg MV Decel Time: 264 msec     TR Vmax:        289.00 cm/s MR Peak grad: 135.0 mmHg MR Mean grad: 96.0 mmHg     SHUNTS MR Vmax:      581.00 cm/s   Systemic VTI:  0.32 m MR Vmean:     476.0 cm/s    Systemic Diam: 2.04 cm MV E velocity: 102.00 cm/s MV A velocity: 130.00 cm/s MV E/A ratio:  0.78 Mihai Croitoru MD Electronically signed by Thurmon Fair MD Signature Date/Time: 07/23/2021/12:20:35 PM    Final      LOS: 2 days   Lanae Boast, MD [facility]  07/25/2021, 7:36 AM

## 2021-07-26 ENCOUNTER — Other Ambulatory Visit: Payer: Self-pay | Admitting: Internal Medicine

## 2021-07-26 DIAGNOSIS — R29818 Other symptoms and signs involving the nervous system: Secondary | ICD-10-CM | POA: Diagnosis not present

## 2021-07-26 DIAGNOSIS — B20 Human immunodeficiency virus [HIV] disease: Secondary | ICD-10-CM

## 2021-07-26 LAB — GLUCOSE, CAPILLARY
Glucose-Capillary: 127 mg/dL — ABNORMAL HIGH (ref 70–99)
Glucose-Capillary: 134 mg/dL — ABNORMAL HIGH (ref 70–99)
Glucose-Capillary: 113 mg/dL — ABNORMAL HIGH (ref 70–99)

## 2021-07-26 LAB — RESP PANEL BY RT-PCR (FLU A&B, COVID) ARPGX2
Influenza A by PCR: NEGATIVE
Influenza B by PCR: NEGATIVE
SARS Coronavirus 2 by RT PCR: NEGATIVE

## 2021-07-26 MED ORDER — ASPIRIN 81 MG PO TBEC: 81.0000 mg | DELAYED_RELEASE_TABLET | Freq: Every day | ORAL | 0 refills | Status: AC

## 2021-07-26 MED ORDER — TICAGRELOR 90 MG PO TABS: 90.0000 mg | ORAL_TABLET | Freq: Two times a day (BID) | ORAL | Status: AC

## 2021-07-26 NOTE — Progress Notes (Signed)
Lindsey Leon Post D/C'd Skilled nursing facility per MD order. Call placed to [facility] to give report to receiving nurse with no success.  VSS WNL , Skin clean, dry and intact without evidence of skin break down, no evidence of skin tears noted. IV catheter discontinued intact. Site without signs and symptoms of complications. Dressing and pressure applied. An After Visit Summary was printed and given to PTAR  Patient escorted via stretcher , and D/C to [facility]  via ambulance at 1845.   [name] 07/26/2021 8:09 PM

## 2021-07-26 NOTE — Plan of Care (Signed)
?  Problem: Education: ?Goal: Knowledge of General Education information will improve ?Description: Including pain rating scale, medication(s)/side effects and non-pharmacologic comfort measures ?Outcome: Adequate for Discharge ?  ?Problem: Health Behavior/Discharge Planning: ?Goal: Ability to manage health-related needs will improve ?Outcome: Adequate for Discharge ?  ?Problem: Clinical Measurements: ?Goal: Ability to maintain clinical measurements within normal limits will improve ?Outcome: Adequate for Discharge ?Goal: Will remain free from infection ?Outcome: Adequate for Discharge ?Goal: Diagnostic test results will improve ?Outcome: Adequate for Discharge ?Goal: Respiratory complications will improve ?Outcome: Adequate for Discharge ?Goal: Cardiovascular complication will be avoided ?Outcome: Adequate for Discharge ?  ?Problem: Nutrition: ?Goal: Adequate nutrition will be maintained ?Outcome: Adequate for Discharge ?  ?Problem: Coping: ?Goal: Level of anxiety will decrease ?Outcome: Adequate for Discharge ?  ?Problem: Pain Managment: ?Goal: General experience of comfort will improve ?Outcome: Adequate for Discharge ?  ?Problem: Safety: ?Goal: Ability to remain free from injury will improve ?Outcome: Adequate for Discharge ?  ?Problem: Skin Integrity: ?Goal: Risk for impaired skin integrity will decrease ?Outcome: Adequate for Discharge ?  ?

## 2021-07-26 NOTE — TOC Transition Note (Signed)
Transition of Care [facility]) - CM/SW Discharge Note   Patient Details  Name: ORVA RILES MRN: [id number] Date of Birth: 1956-06-28  [facility] CM/SW Contact:  Baldemar Lenis, LCSW Phone Number: 07/26/2021, 1:17 PM   Clinical Narrative:   Nurse to call report to [phone].    Final next level of care: Skilled Nursing Facility Barriers to Discharge: Barriers Resolved   Patient Goals and CMS Choice Patient states their goals for this hospitalization and ongoing recovery are:: SNF (spoke with patients sister [name]) CMS Medicare.gov Compare Post Acute Care list provided to:: Patient Represenative (must comment) (CSW spoke with patients sister [name]) Choice offered to / list presented to : Sibling (Patients sister [name])  Discharge Placement              Patient [facility] bed at: [facility] Patient to be transferred to facility by: PTAR Name of family member notified: Self Patient and family notified of of transfer: 07/26/21  Discharge Plan and Services In-house Referral: Clinical Social Work                                   Social Determinants of Health (SDOH) Interventions     Readmission Risk Interventions No flowsheet data found.

## 2021-07-26 NOTE — Discharge Summary (Signed)
Physician Discharge Summary  Lindsey Leon ZHY:[id number] DOB: 20-Sep-1956 DOA: 07/22/2021  PCP: Karna Dupes, MD  Admit date: 07/22/2021 Discharge date: 07/26/2021  Admitted From: NH Disposition:  SNF/NH  Recommendations for Outpatient Follow-up:  Follow up with PCP in 1-2 weeks Please obtain BMP/CBC in one week Follow-up with ENT in 2 wk Follow-up with neurology Dr. Pearlean Brownie  Home Health:NO  Equipment/Devices: NONE  Discharge Condition: Stable Code Status:   Code Status: Full Code Diet recommendation:  Diet Order             Diet heart healthy/carb modified Room service appropriate? Yes; Fluid consistency: Thin  Diet effective now                    Brief/Interim Summary:65 year old female with history of CVA significant alcohol use, carotid artery disease chronic hepatitis C, dementia/poor historian from facility,hypertension,HIV CAD status post CABG,DM presented with right-sided weakness starting 07/21/2021 afternoon around 1 PM feeling tired. Patient was seen in the ED CT head/CTA head and neck no LVO,chronic carotid artery disease-chronic occlusion of right common carotid artery, severe calcified stenosis in the right bifurcation, severe ostial stenosis of both vertebral arteries, 1.7 cm soft tissue lesion in the right parotid gland.Patient managed for new acute ischemic infarct seen in MRI brain, neuro was consulted and they have advised to continue aspirin 81 and Brilinta 90 mg twice daily x30 days after which patient to go back to Plavix 75 mg qd alone.  Continue low-dose statins outpatient follow-up with Dr. Pearlean Brownie in 4 weeks.  PT OT has recommended skilled nursing facility, patient is from longterm NH.  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  Previous history of stroke.  Echocardiogram acute findings, HbA1c 6.3, LDL 39 target less than 70.  Neuro was consulted and they have advised to continue aspirin 81 and Brilinta 90 mg twice daily x30 days after which patient to go back to Plavix 75 mg qd alone.    HIV: Recent CD4 9/14 681, viral load 101 (high). Continue his Biktarvy. Advise follow-up with his ID.  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  Dementia: Supportive care delirium precaution fall precaution.  Continue Aricept  Chronic hepatitis C without hepatic coma OP f/u  Type 2 diabetes mellitus with hyperglycemia well controlled HbA1c.  Continue patient insulin regimen.  Count Recent Labs  Lab 07/25/21 0735 07/25/21 1315 07/25/21 1650 07/25/21 2110 07/26/21 0624  GLUCAP 145* 144* 117* 120* 127*    HTN: Continue losartan and also resume her chlorthalidone losartan .  1.7 cm soft tissue lesion in the right parotid gland: Will need ENT follow-up-advised to follow-up  Scattered groundglass opacity in the partially visualized lung-chest x-ray no acute finding, not symptomatic.  Follow-up outpatient.  Consults: Neurology  Subjective: Alert awake oriented at baseline mild right-sided weakness still present. Discharge Exam: Vitals:   07/26/21 0345 07/26/21 0400  BP: (!) 113/53   Pulse:    Resp:    Temp:  98.3 F (36.8 C)  SpO2:  97%   General: Pt is alert, awake, not in acute distress Cardiovascular: RRR, S1/S2 +, no rubs, no gallops Respiratory: CTA bilaterally, no wheezing, no rhonchi Abdominal: Soft, NT, ND, bowel  sounds + Extremities: no edema, no cyanosis  Discharge Instructions  Discharge Instructions     Ambulatory referral to Neurology   Complete by: As directed    An appointment is requested in approximately: 4 weeks, [place], in  hosp 10/10.   Discharge instructions   Complete by: As directed    He was placed on aspirin and Brilinta for his stroke only for 30 days after which stop both and start your previous Plavix 75 mg daily. You will need to follow-up with neurology in 4 weeks. Follow-up with ENT doctor regarding parotid gland mass  Please call call MD or return to ER for similar or worsening recurring problem that brought you to hospital or if any fever,nausea/vomiting,abdominal pain, uncontrolled pain, chest pain,  shortness of breath or any other alarming symptoms.  Please follow-up your doctor as instructed in a week time and call the office for appointment.  Please avoid alcohol, smoking, or any other illicit substance and maintain healthy habits including taking your regular medications as prescribed.  You were cared for by a hospitalist during your hospital stay. If you have any questions about your discharge medications or the care you received while you were in the hospital after you are discharged, you can call the unit and ask to speak with the hospitalist on call if the hospitalist that took care of you is not available.  Once you are discharged, your primary care physician will handle any further medical issues. Please note that NO REFILLS for any discharge medications will be authorized once you are discharged, as it is imperative that you return to your primary care physician (or establish a relationship with a primary care physician if you do not have one) for your aftercare needs so that they can reassess your need for medications and monitor your lab values   Increase activity slowly   Complete by: As directed       Allergies as of 07/26/2021   No Known Allergies      Medication List     STOP taking these medications    clopidogrel 75 MG tablet Commonly known as: PLAVIX       TAKE these medications    acetaminophen 325 MG tablet Commonly known as: TYLENOL Take 650 mg by mouth  every 12 (twelve) hours as needed (pain).   albuterol 108 (90 Base) MCG/ACT inhaler Commonly known as: VENTOLIN HFA Inhale 2 puffs into the lungs every 8 (eight) hours as needed for shortness of breath.   aspirin 81 MG EC tablet Take 1 tablet (81 mg total) by mouth daily. Swallow whole. Start taking on: July 27, 2021   atorvastatin 10 MG tablet Commonly known as: LIPITOR Take 10 mg by mouth every morning.   Biktarvy 50-200-25 MG Tabs tablet Generic drug: bictegravir-emtricitabine-tenofovir AF TAKE 1 TABLET BY MOUTH DAILY. What changed: when to take this   chlorthalidone 25 MG tablet Commonly known as: HYGROTON Take 25 mg by mouth every morning.   donepezil 5 MG tablet Commonly known as: ARICEPT Take 5 mg by mouth at bedtime. What changed: Another medication with the same name was removed. Continue taking this medication, and follow the directions you see here.   famotidine 20 MG tablet Commonly known as: PEPCID Take 20 mg by mouth every morning.   Fish Oil 1000 MG Caps Take 1,000 mg by mouth every morning.   furosemide 20 MG tablet Commonly known as: LASIX Take 20 mg by mouth daily as needed (shortness of breath).  gabapentin 800 MG tablet Commonly known as: NEURONTIN Take 800 mg by mouth at bedtime. Take with a  capsule to equal  at bedtime.   gabapentin 100 MG capsule Commonly known as: NEURONTIN Take 100 mg by mouth at bedtime. Take with an  tablet to equal  at bedtime.   gabapentin 300 MG capsule Commonly known as: NEURONTIN Take 300 mg by mouth every morning.   Icy Hot Max 4-1 % Aero Generic drug: Lidocaine-Menthol Apply 1 application topically daily as needed (pain).   insulin glargine 100 UNIT/ML injection Commonly known as: LANTUS Inject 13 Units into the skin at bedtime.   losartan 100 MG tablet Commonly known as: COZAAR Take 100 mg by mouth daily at 12 noon.   Menthol (Topical Analgesic) 5 % Gel Apply 1 application  topically every 12 (twelve) hours as needed (right foot and heel pain).   metoprolol succinate 50 MG 24 hr tablet Commonly known as: TOPROL-XL Take 50 mg by mouth every morning. Take with or immediately following a meal.   polyethylene glycol powder 17 GM/SCOOP powder Commonly known as: GLYCOLAX/MIRALAX Take 17 g by mouth daily as needed (constipation).   potassium chloride SA 20 MEQ tablet Commonly known as: KLOR-CON Take 20 mEq by mouth every Monday, Wednesday, and Friday.   sennosides-docusate sodium 8.6-50 MG tablet Commonly known as: SENOKOT-S Take 2 tablets by mouth at bedtime.   ticagrelor 90 MG Tabs tablet Commonly known as: BRILINTA Take 1 tablet (90 mg total) by mouth 2 (two) times daily.   TUMS ULTRA 1000 PO Take 2 tablets by mouth daily as needed (indigestion).   Vitamin B Complex Tabs Take 1 tablet by mouth every morning.   Vitamin D 50 MCG (2000 UT) tablet Take 2,000 Units by mouth every morning.        Follow-up Information     Karna Dupes, MD Follow up in 1 week(s).   Specialty: Family Medicine Contact information: [address] [place] [address] [phone]         Osborn Coho, MD Follow up in 2 week(s).   Specialty: Otolaryngology Why: for  rt parotid mass follow up Contact information: [address] [place] [address] [phone]                No Known Allergies  The results of significant diagnostics from this hospitalization (including imaging, microbiology, ancillary and laboratory) are listed below for reference.    Microbiology: Recent Results (from the past 240 hour(s))  Resp Panel by RT-PCR (Flu A&B, Covid) Nasopharyngeal Swab     Status: None   Collection Time: 07/22/21  3:46 PM   Specimen: Nasopharyngeal Swab; Nasopharyngeal(NP) swabs in vial transport medium  Result Value Ref Range Status   SARS Coronavirus 2 by RT PCR NEGATIVE NEGATIVE Final    Comment:  (NOTE) SARS-CoV-2 target nucleic acids are NOT DETECTED.  The SARS-CoV-2 RNA is generally detectable in upper respiratory specimens during the acute phase of infection. The lowest concentration of SARS-CoV-2 viral copies this assay can detect is 138 copies/mL. A negative result does not preclude SARS-Cov-2 infection and should not be used as the sole basis for treatment or other patient management decisions. A negative result may occur with  improper specimen collection/handling, submission of specimen other than nasopharyngeal swab, presence of viral mutation(s) within the areas targeted by this assay, and inadequate number of viral copies(<138 copies/mL). A negative result must be combined with clinical observations, patient history, and  epidemiological information. The expected result is Negative.  Fact Sheet for Patients:  BloggerCourse.com  Fact Sheet for Healthcare Providers:  SeriousBroker.it  This test is no t yet approved or cleared by the [place] FDA and  has been authorized for detection and/or diagnosis of SARS-CoV-2 by FDA under an Emergency Use Authorization (EUA). This EUA will remain  in effect (meaning this test can be used) for the duration of the COVID-19 declaration under Section 564(b)(1) of the Act, 21 U.S.C.section 360bbb-3(b)(1), unless the authorization is terminated  or revoked sooner.       Influenza A by PCR NEGATIVE NEGATIVE Final   Influenza B by PCR NEGATIVE NEGATIVE Final    Comment: (NOTE) The Xpert Xpress SARS-CoV-2/FLU/RSV plus assay is intended as an aid in the diagnosis of influenza from Nasopharyngeal swab specimens and should not be used as a sole basis for treatment. Nasal washings and aspirates are unacceptable for Xpert Xpress SARS-CoV-2/FLU/RSV testing.  Fact Sheet for Patients: BloggerCourse.com  Fact Sheet for Healthcare  Providers: SeriousBroker.it  This test is not yet approved or cleared by the [place] FDA and has been authorized for detection and/or diagnosis of SARS-CoV-2 by FDA under an Emergency Use Authorization (EUA). This EUA will remain in effect (meaning this test can be used) for the duration of the COVID-19 declaration under Section 564(b)(1) of the Act, 21 U.S.C. section 360bbb-3(b)(1), unless the authorization is terminated or revoked.  Performed at [facility], [address], [place], [place] [address]     Procedures/Studies: CT ANGIO HEAD NECK W WO CM  Result Date: 07/22/2021 CLINICAL DATA:  Initial evaluation for neuro deficit, stroke suspected. EXAM: CT ANGIOGRAPHY HEAD AND NECK TECHNIQUE: Multidetector CT imaging of the head and neck was performed using the standard protocol during bolus administration of intravenous contrast. Multiplanar CT image reconstructions and MIPs were obtained to evaluate the vascular anatomy. Carotid stenosis measurements (when applicable) are obtained utilizing NASCET criteria, using the distal internal carotid diameter as the denominator. CONTRAST:  OMNIPAQUE IOHEXOL 350 MG/ML SOLN COMPARISON:  CT from 02/11/2021. FINDINGS: CT HEAD FINDINGS Brain: Moderately advanced cerebral atrophy with chronic small vessel ischemic disease. Multiple scattered remote lacunar infarcts present about the bilateral basal ganglia and thalami. Superimposed mineralization at the deep gray nuclei. No acute intracranial hemorrhage. No acute large vessel territory infarct. No mass lesion or midline shift. Ventricular prominence related to global parenchymal volume loss of hydrocephalus. No extra-axial fluid collection. Vascular: No hyperdense vessel. Calcified atherosclerosis present at the skull base. Skull: Scalp soft tissues within normal limits.  Calvarium intact. Sinuses: Visualized paranasal sinuses and mastoid air cells are clear. Orbits:  Globes and orbital soft tissues demonstrate no acute finding. Review of the MIP images confirms the above findings CTA NECK FINDINGS Aortic arch: Visualized aortic arch normal in caliber with normal branch pattern. Moderate to advanced atheromatous change about the arch and origin of the great vessels without high-grade stenosis. Right carotid system: Right common carotid artery occluded at its origin. Distal reconstitution at the right carotid bifurcation via collateral flow from the right external carotid artery. Bulky calcified plaque at the right carotid bulb and origin of the right ICA with severe near occlusive stenosis (series 9, image 228). Right ICA attenuated but patent distally to the skull base. Left carotid system: Scattered atheromatous plaque throughout the left CCA without high-grade stenosis. Eccentric calcified plaque at the proximal left ICA with associated stenosis of up to approximately 50% by NASCET criteria. Left ICA tortuous  but patent distally without stenosis. Vertebral arteries: Both vertebral arteries arise from subclavian arteries. 50% stenosis noted involving the proximal left subclavian artery prior to the takeoff of the left vertebral artery. Proximal right subclavian artery not well assessed due to adjacent venous contamination. Atheromatous change at the origins of both vertebral arteries with severe ostial stenoses. Additional atheromatous irregularity within the proximal left V1 and V2 segments with associated mild to moderate multifocal narrowing. Vertebral arteries otherwise patent to the skull base without stenosis or other acute abnormality. Skeleton: No discrete or worrisome osseous lesions. Moderate spondylosis at C5-6 and C6-7. Median sternotomy wires noted. Other neck: No other acute soft tissue abnormality within the neck. 1.7 cm soft tissue lesion present at the inferior right parotid gland (series 10, image 114), indeterminate. Upper chest: Scattered ground-glass  opacity seen within the partially visualized lungs, favored to reflect pulmonary edema, although multifocal infection could be considered. Review of the MIP images confirms the above findings CTA HEAD FINDINGS Anterior circulation: /petrous segments patent bilaterally. Atheromatous change throughout the carotid siphons with associated severe stenoses at the supraclinoid segments bilaterally. A1 segments patent bilaterally. Right A1 slightly hypoplastic. Normal anterior communicating artery complex. Anterior cerebral arteries patent to their distal aspects without stenosis. No M1 stenosis or occlusion. No proximal MCA branch occlusion. Distal MCA branches perfused and grossly symmetric. Posterior circulation: Both V4 segments patent to the vertebrobasilar junction without stenosis. Neither PICA origin well visualized on this exam. Basilar patent to its distal aspect without stenosis. Superior cerebral arteries patent bilaterally. Both PCAs primarily supplied via the basilar. Atheromatous change within the PCAs bilaterally, left greater than right, with associated moderate bilateral P2 stenoses. PCAs remain patent to their distal aspects. Venous sinuses: Not well assessed due to timing the contrast bolus. Anatomic variants: None significant. Review of the MIP images confirms the above findings IMPRESSION: 1. Negative CTA for emergent large vessel occlusion. 2. Chronic occlusion of the right common carotid artery at its origin with distal reconstitution at the right carotid bifurcation. Additional severe calcified stenosis at the right bifurcation. 3. Atheromatous change throughout the carotid siphons with associated severe bilateral supraclinoid stenoses. 4. Severe ostial stenoses at the origins of both vertebral arteries. 5. 1.7 cm soft tissue lesion at the inferior right parotid gland, indeterminate. Outpatient ENT referral for further workup and consultation suggested. 6. Scattered ground-glass opacity within the  partially visualized lungs, favored to reflect pulmonary edema, although multifocal infection could be considered in the correct clinical setting. Electronically Signed   By: Rise Mu M.D.   On: 07/22/2021 19:38   MR BRAIN WO CONTRAST  Result Date: 07/22/2021 CLINICAL DATA:  Initial evaluation for neuro deficit, stroke suspected. EXAM: MRI HEAD WITHOUT CONTRAST TECHNIQUE: Multiplanar, multiecho pulse sequences of the brain and surrounding structures were obtained without intravenous contrast. COMPARISON:  Prior study from earlier the same day. FINDINGS: Brain: Diffuse prominence of the CSF containing spaces compatible with generalized cerebral atrophy. Extensive T2/FLAIR hyperintensity throughout the periventricular and deep white matter both cerebral hemispheres with involvement of the deep gray nuclei and brainstem, consistent with advanced chronic microvascular ischemic disease. Multiple scattered remote lacunar infarcts present about the hemispheric cerebral white matter, basal ganglia, thalami, and pons. 1 cm acute ischemic infarcts seen involving the periventricular white matter of the posterior left frontal corona radiata/centrum semi ovale (series 5, image 88). No associated hemorrhage or mass effect. Additional 6 mm focus of mild diffusion abnormality seen involving the right splenium (series 5, image 78), likely a focus  of evolving subacute ischemia. No associated hemorrhage or mass effect. No other evidence for acute or subacute ischemia. Gray-white matter differentiation otherwise maintained. No acute intracranial hemorrhage. Chronic hemosiderin staining at the posterior left frontotemporal region again noted. Few additional chronic micro hemorrhages noted, likely small vessel related. No mass lesion, midline shift or mass effect. No hydrocephalus or extra-axial fluid collection. Pituitary gland suprasellar region within normal limits. Midline structures intact. Vascular: Major  intracranial vascular flow voids are maintained. Skull and upper cervical spine: Craniocervical junction within normal limits. Bone marrow signal intensity normal. No scalp soft tissue abnormality. Sinuses/Orbits: Globes and orbital soft tissues demonstrate no acute finding. Paranasal sinuses are largely clear. No mastoid effusion. Inner ear structures grossly normal. Other: None. IMPRESSION: 1. 1 cm acute ischemic nonhemorrhagic periventricular white matter infarct involving the posterior left frontal corona radiata/centrum semi ovale. 2. Additional 6 mm focus of mild diffusion abnormality involving the right splenium, likely a focus of evolving subacute ischemia. No associated hemorrhage or mass effect. 3. Underlying advanced chronic microvascular ischemic disease with multiple remote lacunar infarcts involving the hemispheric cerebral white matter, basal ganglia, thalami, and pons. Electronically Signed   By: Rise Mu M.D.   On: 07/22/2021 23:37   DG Chest Port 1 View  Result Date: 07/22/2021 CLINICAL DATA:  Weakness EXAM: PORTABLE CHEST 1 VIEW COMPARISON:  Portable exam at 2007 hrs compared to 02/11/2021 FINDINGS: Normal heart size post median sternotomy and CABG. Atherosclerotic calcification aorta. Mediastinal contours and pulmonary vascularity normal. Lungs clear. No infiltrate, pleural effusion, or pneumothorax. Bones demineralized. IMPRESSION: Post CABG. No acute abnormalities. Aortic Atherosclerosis (ICD10-I70.0). Electronically Signed   By: Ulyses Southward M.D.   On: 07/22/2021 20:14   ECHOCARDIOGRAM COMPLETE  Result Date: 07/23/2021    ECHOCARDIOGRAM REPORT   Patient Name:   ATTALLAH ONTKO Date of Exam: 07/23/2021 Medical Rec #:  [id number]        Height:       66.0 in Accession #:    [id number]       Weight:       170.0 lb Date of Birth:  [birth year]        BSA:          1.866 m Patient Age:    65 years         BP:           117/58 mmHg Patient Gender: F                HR:           58  bpm. Exam Location:  Inpatient Procedure: 2D Echo Indications:    stroke  History:        Patient has prior history of Echocardiogram examinations, most                 recent 01/02/2021. Prior CABG, HIV; Risk Factors:Hypertension and                 Diabetes.  Sonographer:    Delcie Roch RDCS Referring Phys: [id number] Cecille Po MELVIN IMPRESSIONS  1. Left ventricular ejection fraction, by estimation, is 60 to 65%. The left ventricle has normal function. The left ventricle has no regional wall motion abnormalities. Left ventricular diastolic parameters are consistent with Grade I diastolic dysfunction (impaired relaxation). Elevated left atrial pressure.  2. Right ventricular systolic function is normal. The right ventricular size is normal.  3. The mitral valve is normal in structure. Mild to moderate mitral valve regurgitation.  Moderate mitral annular calcification.  4. The aortic valve is tricuspid. There is mild calcification of the aortic valve. There is moderate thickening of the aortic valve. Aortic valve regurgitation is mild to moderate. Mild to moderate aortic valve sclerosis/calcification is present, without any evidence of aortic stenosis. Comparison(s): No significant change from prior study. Prior images reviewed side by side. FINDINGS  Left Ventricle: Left ventricular ejection fraction, by estimation, is 60 to 65%. The left ventricle has normal function. The left ventricle has no regional wall motion abnormalities. The left ventricular internal cavity size was normal in size. There is  no left ventricular hypertrophy. Left ventricular diastolic parameters are consistent with Grade I diastolic dysfunction (impaired relaxation). Elevated left atrial pressure. Right Ventricle: The right ventricular size is normal. No increase in right ventricular wall thickness. Right ventricular systolic function is normal. Left Atrium: Left atrial size was normal in size. Right Atrium: Right atrial size was normal  in size. Pericardium: There is no evidence of pericardial effusion. Mitral Valve: The mitral valve is normal in structure. Moderate mitral annular calcification. Mild to moderate mitral valve regurgitation. Tricuspid Valve: The tricuspid valve is normal in structure. Tricuspid valve regurgitation is mild. Aortic Valve: The aortic valve is tricuspid. There is mild calcification of the aortic valve. There is moderate thickening of the aortic valve. Aortic valve regurgitation is mild to moderate. Aortic regurgitation PHT measures 523 msec. Mild to moderate aortic valve sclerosis/calcification is present, without any evidence of aortic stenosis. Aortic valve mean gradient measures 8.5 mmHg. Aortic valve peak gradient measures 16.4 mmHg. Aortic valve area, by VTI measures 2.49 cm. Pulmonic Valve: The pulmonic valve was grossly normal. Pulmonic valve regurgitation is not visualized. Aorta: The aortic root and ascending aorta are structurally normal, with no evidence of dilitation. IAS/Shunts: No atrial level shunt detected by color flow Doppler.  LEFT VENTRICLE PLAX 2D LVIDd:         4.30 cm   Diastology LVIDs:         2.90 cm   LV e' medial:    5.44 cm/s LV PW:         1.00 cm   LV E/e' medial:  18.8 LV IVS:        0.80 cm   LV e' lateral:   6.64 cm/s LVOT diam:     2.04 cm   LV E/e' lateral: 15.4 LV SV:         106 LV SV Index:   57 LVOT Area:     3.27 cm  RIGHT VENTRICLE            IVC RV S prime:     9.14 cm/s  IVC diam: 1.70 cm TAPSE (M-mode): 1.4 cm LEFT ATRIUM             Index        RIGHT ATRIUM          Index LA diam:        3.50 cm 1.88 cm/m   RA Area:     9.64 cm LA Vol (A2C):   36.9 ml 19.77 ml/m  RA Volume:   18.10 ml 9.70 ml/m LA Vol (A4C):   37.0 ml 19.82 ml/m LA Biplane Vol: 37.3 ml 19.99 ml/m  AORTIC VALVE AV Area (Vmax):    2.40 cm AV Area (Vmean):   2.22 cm AV Area (VTI):     2.49 cm AV Vmax:           202.50 cm/s AV  Vmean:          132.000 cm/s AV VTI:            0.425 m AV Peak Grad:       16.4 mmHg AV Mean Grad:      8.5 mmHg LVOT Vmax:         149.00 cm/s LVOT Vmean:        89.600 cm/s LVOT VTI:          0.324 m LVOT/AV VTI ratio: 0.76 AI PHT:            523 msec  AORTA Ao Root diam: 2.70 cm Ao Asc diam:  3.20 cm MITRAL VALVE                TRICUSPID VALVE MV Area (PHT): 2.87 cm     TR Peak grad:   33.4 mmHg MV Decel Time: 264 msec     TR Vmax:        289.00 cm/s MR Peak grad: 135.0 mmHg MR Mean grad: 96.0 mmHg     SHUNTS MR Vmax:      581.00 cm/s   Systemic VTI:  0.32 m MR Vmean:     476.0 cm/s    Systemic Diam: 2.04 cm MV E velocity: 102.00 cm/s MV A velocity: 130.00 cm/s MV E/A ratio:  0.78 Mihai Croitoru MD Electronically signed by Thurmon Fair MD Signature Date/Time: 07/23/2021/12:20:35 PM    Final     Labs: BNP (last 3 results) No results for input(s): BNP in the last 8760 hours. Basic Metabolic Panel: Recent Labs  Lab 07/22/21 1656 07/22/21 1718  NA 141 142  K 3.6 3.6  CL 105 105  CO2 25  --   GLUCOSE 113* 113*  BUN 18 19  CREATININE 1.15* 1.00  CALCIUM 9.7  --    Liver Function Tests: Recent Labs  Lab 07/22/21 1656  AST 20  ALT 17  ALKPHOS 42  BILITOT 0.6  PROT 7.2  ALBUMIN 4.2   No results for input(s): LIPASE, AMYLASE in the last 168 hours. No results for input(s): AMMONIA in the last 168 hours. CBC: Recent Labs  Lab 07/22/21 1656 07/22/21 1718  WBC 7.5  --   NEUTROABS 4.1  --   HGB 12.4 11.9*  HCT 36.8 35.0*  MCV 98.9  --   PLT 238  --    Cardiac Enzymes: No results for input(s): CKTOTAL, CKMB, CKMBINDEX, TROPONINI in the last 168 hours. BNP: Invalid input(s): POCBNP CBG: Recent Labs  Lab 07/25/21 0735 07/25/21 1315 07/25/21 1650 07/25/21 2110 07/26/21 0624  GLUCAP 145* 144* 117* 120* 127*   D-Dimer No results for input(s): DDIMER in the last 72 hours. Hgb A1c No results for input(s): HGBA1C in the last 72 hours. Lipid Profile No results for input(s): CHOL, HDL, LDLCALC, TRIG, CHOLHDL, LDLDIRECT in the last 72  hours. Thyroid function studies No results for input(s): TSH, T4TOTAL, T3FREE, THYROIDAB in the last 72 hours.  Invalid input(s): FREET3 Anemia work up No results for input(s): VITAMINB12, FOLATE, FERRITIN, TIBC, IRON, RETICCTPCT in the last 72 hours. Urinalysis    Component Value Date/Time   COLORURINE YELLOW 07/22/2021 1530   APPEARANCEUR HAZY (A) 07/22/2021 1530   LABSPEC >1.046 (H) 07/22/2021 1530   PHURINE 5.0 07/22/2021 1530   GLUCOSEU NEGATIVE 07/22/2021 1530   HGBUR NEGATIVE 07/22/2021 1530   BILIRUBINUR NEGATIVE 07/22/2021 1530   KETONESUR NEGATIVE 07/22/2021 1530   PROTEINUR NEGATIVE 07/22/2021 1530   UROBILINOGEN 0.2 08/11/2014  1610   NITRITE NEGATIVE 07/22/2021 1530   LEUKOCYTESUR NEGATIVE 07/22/2021 1530   Sepsis Labs Invalid input(s): PROCALCITONIN,  WBC,  LACTICIDVEN Microbiology Recent Results (from the past 240 hour(s))  Resp Panel by RT-PCR (Flu A&B, Covid) Nasopharyngeal Swab     Status: None   Collection Time: 07/22/21  3:46 PM   Specimen: Nasopharyngeal Swab; Nasopharyngeal(NP) swabs in vial transport medium  Result Value Ref Range Status   SARS Coronavirus 2 by RT PCR NEGATIVE NEGATIVE Final    Comment: (NOTE) SARS-CoV-2 target nucleic acids are NOT DETECTED.  The SARS-CoV-2 RNA is generally detectable in upper respiratory specimens during the acute phase of infection. The lowest concentration of SARS-CoV-2 viral copies this assay can detect is 138 copies/mL. A negative result does not preclude SARS-Cov-2 infection and should not be used as the sole basis for treatment or other patient management decisions. A negative result may occur with  improper specimen collection/handling, submission of specimen other than nasopharyngeal swab, presence of viral mutation(s) within the areas targeted by this assay, and inadequate number of viral copies(<138 copies/mL). A negative result must be combined with clinical observations, patient history, and  epidemiological information. The expected result is Negative.  Fact Sheet for Patients:  BloggerCourse.com  Fact Sheet for Healthcare Providers:  SeriousBroker.it  This test is no t yet approved or cleared by the [place] FDA and  has been authorized for detection and/or diagnosis of SARS-CoV-2 by FDA under an Emergency Use Authorization (EUA). This EUA will remain  in effect (meaning this test can be used) for the duration of the COVID-19 declaration under Section 564(b)(1) of the Act, 21 U.S.C.section 360bbb-3(b)(1), unless the authorization is terminated  or revoked sooner.       Influenza A by PCR NEGATIVE NEGATIVE Final   Influenza B by PCR NEGATIVE NEGATIVE Final    Comment: (NOTE) The Xpert Xpress SARS-CoV-2/FLU/RSV plus assay is intended as an aid in the diagnosis of influenza from Nasopharyngeal swab specimens and should not be used as a sole basis for treatment. Nasal washings and aspirates are unacceptable for Xpert Xpress SARS-CoV-2/FLU/RSV testing.  Fact Sheet for Patients: BloggerCourse.com  Fact Sheet for Healthcare Providers: SeriousBroker.it  This test is not yet approved or cleared by the [place] FDA and has been authorized for detection and/or diagnosis of SARS-CoV-2 by FDA under an Emergency Use Authorization (EUA). This EUA will remain in effect (meaning this test can be used) for the duration of the COVID-19 declaration under Section 564(b)(1) of the Act, 21 U.S.C. section 360bbb-3(b)(1), unless the authorization is terminated or revoked.  Performed at [facility], [address], [place], [place] [address]      Time coordinating discharge: 25 minutes  SIGNED: Lanae Boast, MD  [facility] 07/26/2021, 9:42 AM  If 7PM-7AM, please contact night-coverage [email or web address]

## 2021-07-26 NOTE — Telephone Encounter (Signed)
Pt currently admitted in hospital.

## 2021-07-26 NOTE — Progress Notes (Signed)
Speech Language Pathology Treatment: Cognitive-Linquistic  Patient Details Name: Lindsey Leon MRN: [id number] DOB: [birth year] Today's Date: 07/26/2021 Time: 2778-2423 SLP Time Calculation (min) (ACUTE ONLY): 16 min  Assessment / Plan / Recommendation Clinical Impression  Pt seen for cognitive intervention post acute CVA. Pt pleasant at bedside. Directed for functional tasks in pts environment including orientation tasks. With visual aids, pt able to improve orientation to x3 to x4 with improvements in temporal orientation. Pt states feeling like thinking skills have improved since admission, aware of her recurrent stroke, labile when speaking with sister via phone. Pt able to perform sequencing task for utilizing room phone to place phone call to sister, though required mild/ moderate verbal cues to complete accurately. Pt plans to DC to SNF where she resided previously with supportive staff and family. Any further cognitive needs may be addressed at next level of care. No further acute ST needs identified.    HPI HPI: Lindsey Leon is a 65 y.o. female with medical history significant of alcohol use, carotid artery disease, chronic hep C, history of CVA, dementia, diverticulosis, hypertension, HIV, diabetes, CAD status post CABG presenting with focal neurologic deficit.    Patient has known history of prior stroke and significant vascular disease.  Previous stroke had some right-sided deficits which have improved from that stroke.  Noticed started having some right-sided weakness starting yesterday afternoon around 1 PM.  Now interfering with ability to walk and bear weight.  Also reports feeling tired.  Has a baseline mild dementia per staff reports at facility; 07/22/21 MRI head revealed 1 cm acute ischemic nonhemorrhagic periventricular white matter  infarct involving the posterior left frontal corona radiata/centrum  semi ovale.  2. Additional 6 mm focus of mild diffusion abnormality involving  the  right splenium, likely a focus of evolving subacute ischemia. No  associated hemorrhage or mass effect.  3. Underlying advanced chronic microvascular ischemic disease with  multiple remote lacunar infarcts involving the hemispheric cerebral  white matter, basal ganglia, thalami, and pons; passed Yale swallow screen 07/22/21; SLE generated      SLP Plan  Discharge SLP treatment due to (comment) (all further needs can be addressed at next level of care)      Recommendations for follow up therapy are one component of a multi-disciplinary discharge planning process, led by the attending physician.  Recommendations may be updated based on patient status, additional functional criteria and insurance authorization.    Recommendations                   Follow up Recommendations: Skilled Nursing facility SLP Visit Diagnosis: Cognitive communication deficit (R41.841) Attention and concentration deficit following: Cerebral infarction Plan: Discharge SLP treatment due to (comment) (all further needs can be addressed at next level of care)       GO               Ardyth Gal MA, CCC-SLP Acute Rehabilitation Services    07/26/2021, 2:41 PM

## 2021-08-23 ENCOUNTER — Encounter (HOSPITAL_COMMUNITY): Payer: Self-pay | Admitting: Radiology

## 2021-08-25 ENCOUNTER — Other Ambulatory Visit: Payer: Self-pay

## 2021-08-25 ENCOUNTER — Emergency Department (HOSPITAL_COMMUNITY)
Admission: EM | Admit: 2021-08-25 | Discharge: 2021-08-26 | Disposition: A | Discharge status: Skilled Nursing Facility | Payer: Medicare Other | Attending: Emergency Medicine | Admitting: Emergency Medicine

## 2021-08-25 ENCOUNTER — Encounter (HOSPITAL_COMMUNITY): Payer: Self-pay

## 2021-08-25 DIAGNOSIS — Z794 Long term (current) use of insulin: Secondary | ICD-10-CM | POA: Insufficient documentation

## 2021-08-25 DIAGNOSIS — Z7982 Long term (current) use of aspirin: Secondary | ICD-10-CM | POA: Diagnosis not present

## 2021-08-25 DIAGNOSIS — R040 Epistaxis: Secondary | ICD-10-CM | POA: Diagnosis not present

## 2021-08-25 DIAGNOSIS — I10 Essential (primary) hypertension: Secondary | ICD-10-CM | POA: Insufficient documentation

## 2021-08-25 DIAGNOSIS — E1165 Type 2 diabetes mellitus with hyperglycemia: Secondary | ICD-10-CM | POA: Diagnosis not present

## 2021-08-25 DIAGNOSIS — Z87891 Personal history of nicotine dependence: Secondary | ICD-10-CM | POA: Insufficient documentation

## 2021-08-25 DIAGNOSIS — Z21 Asymptomatic human immunodeficiency virus [HIV] infection status: Secondary | ICD-10-CM | POA: Insufficient documentation

## 2021-08-25 DIAGNOSIS — F0390 Unspecified dementia without behavioral disturbance: Secondary | ICD-10-CM | POA: Diagnosis not present

## 2021-08-25 DIAGNOSIS — Z79899 Other long term (current) drug therapy: Secondary | ICD-10-CM | POA: Insufficient documentation

## 2021-08-25 LAB — CBC WITH DIFFERENTIAL/PLATELET
Abs Immature Granulocytes: 0.03 10*3/uL (ref 0.00–0.07)
Basophils Absolute: 0 10*3/uL (ref 0.0–0.1)
Basophils Relative: 1 %
Eosinophils Absolute: 0.2 10*3/uL (ref 0.0–0.5)
Eosinophils Relative: 2 %
HCT: 37.1 % (ref 36.0–46.0)
Hemoglobin: 12.8 g/dL (ref 12.0–15.0)
Immature Granulocytes: 0 %
Lymphocytes Relative: 35 %
Lymphs Abs: 2.9 10*3/uL (ref 0.7–4.0)
MCH: 33.2 pg (ref 26.0–34.0)
MCHC: 34.5 g/dL (ref 30.0–36.0)
MCV: 96.4 fL (ref 80.0–100.0)
Monocytes Absolute: 0.4 10*3/uL (ref 0.1–1.0)
Monocytes Relative: 5 %
Neutro Abs: 4.8 10*3/uL (ref 1.7–7.7)
Neutrophils Relative %: 57 %
Platelets: 301 10*3/uL (ref 150–400)
RBC: 3.85 MIL/uL — ABNORMAL LOW (ref 3.87–5.11)
RDW: 13.3 % (ref 11.5–15.5)
WBC: 8.3 10*3/uL (ref 4.0–10.5)
nRBC: 0 % (ref 0.0–0.2)

## 2021-08-25 LAB — PROTIME-INR
INR: 1.1 (ref 0.8–1.2)
Prothrombin Time: 14.5 seconds (ref 11.4–15.2)

## 2021-08-25 MED ORDER — OXYMETAZOLINE HCL 0.05 % NA SOLN
2.0000 | Freq: Once | NASAL | Status: AC
  Administered 2021-08-25: 2 via NASAL
  Filled 2021-08-25: qty 30

## 2021-08-25 NOTE — Discharge Instructions (Signed)
Dear Lindsey Leon,  Thank you for allowing [place] to take care of you today.  We hope you begin feeling better soon.  - Please follow-up with your primary care physician or schedule an appointment to establish a primary care doctor if you do not have one already. - Please return to the Emergency Department or call 911 for chest pain, shortness of breath, severe pain, altered mental status, or if you have any reason to think you may need emergency medical care. Self resolving nosebleed recommendations: - Bedside humidifier  - Avoid all nasal manipulation (picking, nose blowing, sneezing/coughing with mouth closed, gauze or tissues stuffed in the nostrils) - Nasal saline spray (Ocean spray) every 2 hours to both nostrils while awake - Afrin nasal spray to bilateral nares every 6 hours for 3 days. (Given at discharge)  In the event of nose bleeding:  - Spray two larges sprays of Afrin and firmly/uncomfortably pinch the inferior nose at the bony/cartilaginous junction and hold pressure for 15 minutes by the clock.  Lean forward to avoid swallowing the blood and breathe through the mouth. It may help to apply cold compresses or ice across the bridge of the nose. Do NOT pack the inside of the nose with gauze. (You have been given a nasal clamp to use if needed.) - If bleeding persists, repeat for another 15 minutes by the clock - If bleeding still persists, please call or return to the ED  - If the bleeding stops, do NOT sniff or blow the nose for 24-48 hours (this can restart the bleeding).  Use nasal saline to irrigate the blood out of the nose as above.     Sincerely,  Dwaine Gale, DO Department of Emergency Medicine [place]   Epistaxis

## 2021-08-25 NOTE — ED Notes (Signed)
Pericare and linen change performed. Purewick placed.

## 2021-08-25 NOTE — ED Provider Notes (Addendum)
[facility] EMERGENCY DEPARTMENT Provider Note   CSN: [id number] Arrival date & time: 08/25/21  1521     History Chief Complaint  Patient presents with   Epistaxis    Lindsey Leon is a 65 y.o. female.   Epistaxis Associated symptoms: no congestion, no cough, no dizziness, no fever, no headaches, no sinus pain, no sneezing and no sore throat    65 year old female with PMH significant for HIV, HTN, CVA, dementia, chronic hepatitis C, and others as below who presents to the ED with complaints of epistaxis.  She reports that at approximately noon today, she had gradual onset of bleeding from the left nare.  She did not have any falls or facial injuries before this incident.  Denies recent nasal congestion, rhinorrhea, sneezing, or oxygen use.  She does not have a history of frequent nosebleeds.  She tried placing several rounds of packing with minimal relief.  Patient was given Afrin bilaterally by EMS.  She denies blood thinners, on review of records appears to take Brilinta and aspirin..  She denies any dizziness or lightheadedness, chest pain or shortness of breath, palpitations, paresthesias, or weakness.  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   HTN (hypertension) 01/01/2021   Routine screening for STI (sexually transmitted infection) 12/01/2019   Type 2 diabetes mellitus with hyperglycemia ([place]) 02/2019   Dementia ([place]) 12/27/2018   Human immunodeficiency virus (HIV) disease ([place]) 12/25/2018   Carotid artery occlusion 12/25/2018   CVA  (cerebral vascular accident) ([place]) 12/25/2018   Chronic hepatitis C without hepatic coma ([place]) 12/2018   TIA (transient ischemic attack) 12/06/2018   ICH (intracerebral hemorrhage) ([place]) 05/23/2014   Diverticulitis of colon 02/24/2013   Pancreatitis, Hx of 02/24/2013   Alcohol abuse 02/24/2013    Past Surgical History:  Procedure Laterality Date   FLEXIBLE SIGMOIDOSCOPY N/A 02/26/2013   Procedure: FLEXIBLE SIGMOIDOSCOPY;  Surgeon: Beryle Beams, MD;  Location: [place];  Service: Endoscopy;  Laterality: N/A;     OB History   No obstetric history on file.     Family History  Problem Relation Age of Onset   Healthy Father    Cancer Mother        passed away. unknown age   Hypertension Sister    Diabetes Mellitus II Sister     Social History   Tobacco Use   Smoking status: Former    Packs/day: 0.50    Years: 30.00    Pack years: 15.00    Types: Cigarettes    Quit date: 05/22/2015    Years since quitting: 6.2   Smokeless tobacco: Never  Vaping Use   Vaping Use: Never used  Substance Use Topics   Alcohol use: No    Alcohol/week: 0.0 standard drinks   Drug use: Not Currently    Types: Marijuana    Home Medications Prior to Admission medications   Medication Sig Start Date End Date Taking? Authorizing Provider  acetaminophen (TYLENOL) 325 MG tablet Take  650 mg by mouth every 12 (twelve) hours as needed (pain).   Yes [provider]  albuterol (VENTOLIN HFA) 108 (90 Base) MCG/ACT inhaler Inhale 2 puffs into the lungs every 6 (six) hours as needed for shortness of breath.   Yes [provider]  aspirin EC 81 MG EC tablet Take 1 tablet (81 mg total) by mouth daily. Swallow whole. 07/27/21 08/26/21 Yes Antonieta Pert, MD  atorvastatin (LIPITOR) 10 MG tablet Take 10 mg by mouth every morning. 01/24/21 01/24/22 Yes [provider]  B Complex Vitamins (VITAMIN B COMPLEX) TABS Take 1 tablet by mouth every morning.   Yes [provider]   BIKTARVY 50-200-25 MG TABS tablet TAKE 1 TABLET BY MOUTH EVERY DAY Patient taking differently: Take 1 tablet by mouth in the morning. 07/28/21  Yes Comer, Okey Regal, MD  chlorthalidone (HYGROTON) 25 MG tablet Take 25 mg by mouth every morning.   Yes [provider]  Cholecalciferol (VITAMIN D) 50 MCG (2000 UT) tablet Take 2,000 Units by mouth every morning.   Yes [provider]  donepezil (ARICEPT) 5 MG tablet Take 5 mg by mouth at bedtime.   Yes [provider]  famotidine (PEPCID) 20 MG tablet Take 20 mg by mouth daily before breakfast.   Yes [provider]  furosemide (LASIX) 20 MG tablet Take 20 mg by mouth daily as needed (shortness of breath).   Yes [provider]  gabapentin (NEURONTIN) 100 MG capsule Take 100 mg by mouth at bedtime.   Yes [provider]  gabapentin (NEURONTIN) 300 MG capsule Take 300 mg by mouth every morning.   Yes [provider]  gabapentin (NEURONTIN) 800 MG tablet Take 800 mg by mouth at bedtime.   Yes [provider]  insulin glargine (LANTUS) 100 UNIT/ML injection Inject 13 Units into the skin at bedtime.   Yes [provider]  Lidocaine-Menthol (ICY HOT MAX) 4-1 % AERO Apply 1 application topically daily as needed (pain).   Yes [provider]  losartan (COZAAR) 100 MG tablet Take 100 mg by mouth daily at 12 noon.   Yes [provider]  Menthol, Topical Analgesic, 5 % GEL Apply 1 application topically every 12 (twelve) hours as needed (right foot and heel pain).   Yes [provider]  Omega-3 Fatty Acids (FISH OIL) 1000 MG CAPS Take 1,000 mg by mouth every morning.   Yes [provider]  OXYGEN Inhale 2 L/min into the lungs as needed (for sats less than 92%).   Yes [provider]  polyethylene glycol powder (GLYCOLAX/MIRALAX) 17 GM/SCOOP powder Take 17 g by mouth daily as needed (constipation).   Yes [provider]  potassium  chloride SA (KLOR-CON) 20 MEQ tablet Take 20 mEq by mouth every Monday, Wednesday, and Friday. 05/17/21  Yes [provider]  sennosides-docusate sodium (SENOKOT-S) 8.6-50 MG tablet Take 2 tablets by mouth at bedtime.   Yes [provider]  ticagrelor (BRILINTA) 90 MG TABS tablet Take 1 tablet (90 mg total) by mouth 2 (two) times daily. 07/26/21 08/25/21 Yes Kc, Maren [place], MD  TOPROL XL 50 MG 24 hr tablet Take 50 mg by mouth in the morning. Take with or immediately following a meal.   Yes [provider]  TUMS ULTRA 1000 1000 MG chewable tablet Chew 2,000 mg by mouth daily as needed (for indigestion).   Yes [provider]    Allergies    Patient has no known allergies.  Review of  Systems   Review of Systems  Constitutional:  Negative for activity change, appetite change, chills and fever.  HENT:  Positive for nosebleeds. Negative for congestion, ear pain, rhinorrhea, sinus pressure, sinus pain, sneezing, sore throat and trouble swallowing.   Eyes:  Negative for pain and visual disturbance.  Respiratory:  Negative for cough and shortness of breath.   Cardiovascular:  Negative for chest pain, palpitations and leg swelling.  Gastrointestinal:  Negative for abdominal pain, blood in stool, constipation, diarrhea, nausea and vomiting.  Genitourinary:  Negative for dysuria and hematuria.  Musculoskeletal:  Negative for arthralgias, back pain, neck pain and neck stiffness.  Skin:  Negative for color change, pallor and rash.  Neurological:  Negative for dizziness, tremors, seizures, syncope, weakness, light-headedness, numbness and headaches.  Hematological:  Does not bruise/bleed easily.  Psychiatric/Behavioral:  Negative for confusion. The patient is not nervous/anxious.   All other systems reviewed and are negative.  Physical Exam Updated Vital Signs BP 100/77   Pulse (!) 52   Temp (!) 97.5 F (36.4 C) (Oral)   Resp 16   Ht 5\' 6"  (1.676 m)   Wt 77.1 kg    SpO2 97%   BMI 27.43 kg/m   Physical Exam Vitals and nursing note reviewed.  Constitutional:      General: She is not in acute distress.    Appearance: Normal appearance. She is well-developed and normal weight. She is not ill-appearing, toxic-appearing or diaphoretic.  HENT:     Head: Normocephalic and atraumatic.     Right Ear: External ear normal.     Left Ear: External ear normal.     Nose: No nasal deformity, septal deviation, signs of injury, laceration, nasal tenderness, mucosal edema or congestion.     Right Nostril: No foreign body, epistaxis, septal hematoma or occlusion.     Left Nostril: Epistaxis present. No septal hematoma or occlusion.     Comments: Small nidus of bright red bleeding on left anterior nasal septum, minor active bleeding on arrival    Mouth/Throat:     Lips: Pink.     Mouth: Mucous membranes are moist.     Pharynx: Uvula midline. No oropharyngeal exudate or posterior oropharyngeal erythema.     Comments: Small postnasal bright red bleeding into posterior oropharynx, airway intact and well maintained Eyes:     General: No scleral icterus.    Extraocular Movements: Extraocular movements intact.     Conjunctiva/sclera: Conjunctivae normal.     Pupils: Pupils are equal, round, and reactive to light.  Cardiovascular:     Rate and Rhythm: Regular rhythm. Bradycardia present.     Pulses: Normal pulses.     Heart sounds: Normal heart sounds. No murmur heard. Pulmonary:     Effort: Pulmonary effort is normal. No respiratory distress.     Breath sounds: Normal breath sounds.  Abdominal:     Palpations: Abdomen is soft.     Tenderness: There is no abdominal tenderness. There is no guarding or rebound.  Musculoskeletal:        General: Normal range of motion.     Cervical back: Normal range of motion and neck supple. No rigidity or tenderness.     Right lower leg: No edema.     Left lower leg: No edema.  Lymphadenopathy:     Cervical: No cervical  adenopathy.  Skin:    General: Skin is warm and dry.     Capillary Refill: Capillary refill takes less than 2 seconds.  Coloration: Skin is not pale.  Neurological:     General: No focal deficit present.     Mental Status: She is alert and oriented to person, place, and time. Mental status is at baseline.     GCS: GCS eye subscore is 4. GCS verbal subscore is 5. GCS motor subscore is 6.  Psychiatric:        Mood and Affect: Mood normal.        Behavior: Behavior normal. Behavior is cooperative.    ED Results / Procedures / Treatments   Labs (all labs ordered are listed, but only abnormal results are displayed) Labs Reviewed  CBC WITH DIFFERENTIAL/PLATELET - Abnormal; Notable for the following components:      Result Value   RBC 3.85 (*)    All other components within normal limits  PROTIME-INR    EKG None  Radiology No results found.  Procedures .Epistaxis Management  Date/Time: 08/25/2021 4:00 AM Performed by: Cherly Hensen, DO Authorized by: Gareth Morgan, MD   Consent:    Consent obtained:  Verbal   Consent given by:  Patient   Risks, benefits, and alternatives were discussed: yes     Risks discussed:  Bleeding, infection, nasal injury and pain   Alternatives discussed:  No treatment Universal protocol:    Procedure explained and questions answered to patient or proxy's satisfaction: yes     Relevant documents present and verified: yes     Patient identity confirmed:  Verbally with patient and arm band Anesthesia:    Anesthesia method:  None Procedure details:    Treatment complexity:  Limited   Treatment episode: initial   Post-procedure details:    Assessment:  Bleeding stopped   Procedure completion:  Tolerated well, no immediate complications Comments:     Packing placed by EMS removed, clot dislodged.  Afrin sprayed in bilateral nares.  No repeat bleeding.   Medications Ordered in ED Medications  oxymetazoline (AFRIN) 0.05 % nasal spray 2  spray (2 sprays Each Nare Given 08/25/21 1728)    ED Course  I have reviewed the triage vital signs and the nursing notes.  Pertinent labs & imaging results that were available during my care of the patient were reviewed by me and considered in my medical decision making (see chart for details).    MDM Rules/Calculators/A&P                           Lindsey Leon is a 65 y.o. female presenting with epistaxis. Initial VS sig for bradycardia.  Labs: CBC WNL, hemoglobin 12.8.  Coags WNL.   Imaging: Not indicated  DDX considered: Facial trauma, septal hematoma, acute blood loss anemia, angiodysplasia. History, examination, and objective data most consistent with acute epistaxis.  No signs or history of trauma on exam.  Hemodynamically stable with minimal bleeding while in ED.  No septal hematoma visualized or significant vascular abnormalities.  Hemoglobin appears stable and patient remained hemodynamically stable.  Low suspicion for posterior bleed given visualized bleeding area.  No other history or signs of systemic bleeding.  Medications administered as above.  Epistaxis managed with Afrin as above.  No repeat epistaxis while observed in the ED.  Re-evaluated prior to discharge. Hemodynamically stable and in no acute distress. Nares clear and hemostatic bilaterally, no posterior oropharyngeal bleeding.  Discharged home in stable condition. Given epistaxis instructions and bottle of Afrin at discharge.  Given nasal clip for repeat episodes.  Strict ED return  precautions advised. Supportive care discussed. Outpatient PCP follow-up advised. Patient understands and agrees with the plan.  The plan for this patient was discussed with my attending physician, who voiced agreement and who oversaw evaluation and treatment of this patient.     Note: Chief Executive Officer was used in the creation of this note.  Final Clinical Impression(s) / ED Diagnoses Final diagnoses:   Epistaxis    Rx / DC Orders ED Discharge Orders     None        Dwaine Gale, DO 08/25/21 2054    Dwaine Gale, DO 08/25/21 2055    Alvira Monday, MD 08/26/21 1413

## 2021-08-25 NOTE — ED Triage Notes (Signed)
Nose bleeding for 2 hours to the right nares, no known injury pt states was watching TV and started and couldn't get it to stop. Been packed x 3 denies dizziness, HA Hx of HTN BP 118/55 HR 50-60

## 2021-08-25 NOTE — ED Triage Notes (Signed)
2 sprays of afrin in each nare pta

## 2021-08-26 NOTE — ED Notes (Signed)
Pt discharged and wheeled out of the ED on a stretcher with [facility] crew. 

## 2021-08-31 ENCOUNTER — Ambulatory Visit (INDEPENDENT_AMBULATORY_CARE_PROVIDER_SITE_OTHER): Payer: Medicare Other | Admitting: Neurology

## 2021-08-31 ENCOUNTER — Encounter: Payer: Self-pay | Admitting: Neurology

## 2021-08-31 ENCOUNTER — Other Ambulatory Visit: Payer: Self-pay

## 2021-08-31 VITALS — BP 102/62 | HR 59

## 2021-08-31 DIAGNOSIS — I6521 Occlusion and stenosis of right carotid artery: Secondary | ICD-10-CM

## 2021-08-31 DIAGNOSIS — F01A0 Vascular dementia, mild, without behavioral disturbance, psychotic disturbance, mood disturbance, and anxiety: Secondary | ICD-10-CM

## 2021-08-31 DIAGNOSIS — I6381 Other cerebral infarction due to occlusion or stenosis of small artery: Secondary | ICD-10-CM | POA: Diagnosis not present

## 2021-08-31 MED ORDER — CLOPIDOGREL BISULFATE 75 MG PO TABS: 75.0000 mg | ORAL_TABLET | Freq: Every day | ORAL | 11 refills | Status: AC

## 2021-08-31 NOTE — Patient Instructions (Signed)
I had a long d/w patient  and CNA accompanying her about her recent lacunar strokes and vascular dementia, risk for recurrent stroke/TIAs, personally independently reviewed imaging studies and stroke evaluation results and answered questions.Continue Plavix 75 mg daily for secondary stroke prevention and maintain strict control of hypertension with blood pressure goal below 130/90, diabetes with hemoglobin A1c goal below 6.5% and lipids with LDL cholesterol goal below 70 mg/dL. I also advised the patient to eat a healthy diet with plenty of whole grains, cereals, fruits and vegetables, exercise regularly and maintain ideal body weight .continue ongoing physical occupational therapy.  Mobilize out of bed as tolerated.  Increase dose of Aricept to 10 mg daily to help with dementia.  Followup in the future with me in future only as needed and no schedule appointment was made.

## 2021-08-31 NOTE — Progress Notes (Signed)
[facility] [address] [place]. [place] [address] [phone]       OFFICE CONSULT NOTE  Ms. DOLA SKRZYPCZAK Date of Birth:  Oct 01, 1956 Medical Record Number:  [id number]   Referring MD: Rosalin Hawking  Reason for Referral: Stroke  HPI: Ms. Bonnet is a 65 year old pleasant African-American lady seen today for initial office consultation visit stroke.  History is obtained from the patient and review of electronic medical records and I personally reviewed pertinent imaging films in PACS.  She has past medical history for diabetes, hypertension, hepatitis C, HIV positive, pancreatitis, prior left temporal hemorrhagic stroke and multifocal multivessel stenosis with known chronic right common carotid artery stenosis.  She presented on 01/01/2021 with sudden onset of double vision.  She woke up that morning saying the vision is funny.  It got worse during the course of the day prompting visit to the ER.  She denies any slurred speech, extremity weakness numbness gait or balance problems.  On exam she was found to have left internuclear ophthalmoplegia and MRI scan of the brain was obtained which showed a small dorsal pontine as well as left subcortical corona radiata lacunar infarcts.  CT angiogram showed chronic right common carotid artery occlusion with 80% right ICA stenosis and left greater than right carotid siphon stenosis and bilateral vertebral origin stenosis right more than left.  2D echo showed ejection fraction of 6065%.  LDL cholesterol was low at 20 mg percent and hemoglobin A1c was 6.9.  Patient was on aspirin 325 and Plavix 75 mg daily prior to admission this was changed to aspirin 81 mg and Brilinta 90 mg twice daily for 30 days and then Plavix alone.  Patient is currently in [facility] home for the last 2 to 3 years.  She is getting therapies.  She states her diplopia resolved within a few days.  Gait and balance have improved.  Her main complaint today is  decreased short-term memory and cognitive difficulties following her stroke.  She is remains on Plavix which is tolerating well without bruising or bleeding.  She takes her blood pressure is under good control and today was 141/65.  She states her sugars are also doing quite good.  She remains on Lipitor restarting well without muscle aches and pains.  She is able to ambulate with a walker and can walk independently.  Has not had any work-up for reversible causes of memory loss.  She has prior history of TIA with transient speech difficulties on 12/07/2018.  In August 2015 she was admitted for left temporal infarct cranial hemorrhage in the time urine drug screen was positive for cannabis MRI showed bilateral chronic basal ganglia infarcts. Update 07/06/2021 ; She returns for follow-up after last visit 3 months ago.  She states she continues to have memory difficulties and trouble remembering recent information and calculations.  She had lab work done at last visit and vitamin B12, TSH and RPR were negative.  EEG done on 04/11/2021 was normal.  Patient continues to live in a skilled nursing facility.  She ambulates using a wheeled walker.  She has had no falls or injuries.  She remains on Plavix for stroke prevention which is tolerating well without bruising or bleeding.  She has had no recurrent stroke or TIA symptoms.  States her blood pressure under good control.  She is willing to try Aricept for memory loss. Update 08/31/2021 : She returns for follow-up after last visit 2 months ago.  She is accompanied by a  CNA from the nursing home.  She presented to [facility] emergency room on 07/21/2021 with right-sided weakness couple of days.  CT scan of the head showed no acute abnormality and CT angiogram shows chronic right carotid occlusion with severe calcified stenosis at the right bifurcation and severe bilateral ostial stenosis of both vertebral arteries.  There was calcific stenosis of both carotid siphons as  well.  MRI scan of the brain showed small left frontal white matter lacunar infarct and slightly older right splenium corpus callosum lacunar infarct as well.  She was placed on aspirin and Brilinta for 30 days to be followed by Plavix.  LDL cholesterol is optimal at 39 mg percent hemoglobin A1c was 6.3.  Echocardiogram showed ejection fraction 60 to 65% with grade 1 diastolic dysfunction.  Patient is back in the nursing home.  She has had significant functional decline.  She is no longer able to ambulate.  She is getting physical Occupational Therapy but is wheelchair-bound.  She is total care and assist.  She is incontinent and wears diapers.  She has also had cognitive decline.  She is only on Aricept 5 mg daily and is unclear if she is tried a higher dose.  I previously checked a dementia panel lab in 03/28/2021 and vitamin B12, homocystine, TSH and RPR were all normal.  She does have HIV and follows at the infectious disease clinic and is on treatment ROS:   14 system review of systems is positive for memory loss, difficulty with calculation, imbalance, diplopia, gait difficulty, unable to walk and wheelchair-bound all other systems negative  PMH:  Past Medical History:  Diagnosis Date   Chronic hepatitis C ([place])    dx'ed in 12/2018   Dementia ([place])    Diverticulitis    HIV infection ([place])    dx'ed in 12/2018   Hypertension    not on medications   Pancreatitis    Stroke [facility])     Social History:  Social History   Socioeconomic History   Marital status: Single    Spouse name: Not on file   Number of children: 1   Years of education: 12   Highest education level: Not on file  Occupational History   Not on file  Tobacco Use   Smoking status: Former    Packs/day: 0.50    Years: 30.00    Pack years: 15.00    Types: Cigarettes    Quit date: 05/22/2015    Years since quitting: 6.2   Smokeless tobacco: Never  Vaping Use   Vaping Use: Never used  Substance and Sexual Activity    Alcohol use: No    Alcohol/week: 0.0 standard drinks   Drug use: Not Currently    Types: Marijuana   Sexual activity: Never  Other Topics Concern   Not on file  Social History Narrative   Lives att [facility] facility    Patient is right handed.   Drinks no caffeine daily   Social Determinants of Radio broadcast assistant Strain: Not on file  Food Insecurity: Not on file  Transportation Needs: Not on file  Physical Activity: Not on file  Stress: Not on file  Social Connections: Not on file  Intimate Partner Violence: Not on file    Medications:   Current Outpatient Medications on File Prior to Visit  Medication Sig Dispense Refill   acetaminophen (TYLENOL) 325 MG tablet Take 650 mg by mouth every 12 (twelve) hours as needed (pain).     albuterol (  VENTOLIN HFA) 108 (90 Base) MCG/ACT inhaler Inhale 2 puffs into the lungs every 6 (six) hours as needed for shortness of breath.     atorvastatin (LIPITOR) 10 MG tablet Take 10 mg by mouth every morning.     B Complex Vitamins (VITAMIN B COMPLEX) TABS Take 1 tablet by mouth every morning.     BIKTARVY 50-200-25 MG TABS tablet TAKE 1 TABLET BY MOUTH EVERY DAY (Patient taking differently: Take 1 tablet by mouth in the morning.) 30 tablet 5   chlorthalidone (HYGROTON) 25 MG tablet Take 25 mg by mouth every morning.     Cholecalciferol (VITAMIN D) 50 MCG (2000 UT) tablet Take 2,000 Units by mouth every morning.     donepezil (ARICEPT) 5 MG tablet Take 10 mg by mouth at bedtime.     famotidine (PEPCID) 20 MG tablet Take 20 mg by mouth daily before breakfast.     furosemide (LASIX) 20 MG tablet Take 20 mg by mouth daily as needed (shortness of breath).     gabapentin (NEURONTIN) 100 MG capsule Take 100 mg by mouth at bedtime.     gabapentin (NEURONTIN) 300 MG capsule Take 300 mg by mouth every morning.     gabapentin (NEURONTIN) 800 MG tablet Take 800 mg by mouth at bedtime.     insulin glargine (LANTUS) 100 UNIT/ML injection Inject 13  Units into the skin at bedtime.     Lidocaine-Menthol (ICY HOT MAX) 4-1 % AERO Apply 1 application topically daily as needed (pain).     losartan (COZAAR) 100 MG tablet Take 100 mg by mouth daily at 12 noon.     Menthol, Topical Analgesic, 5 % GEL Apply 1 application topically every 12 (twelve) hours as needed (right foot and heel pain).     Omega-3 Fatty Acids (FISH OIL) 1000 MG CAPS Take 1,000 mg by mouth every morning.     OXYGEN Inhale 2 L/min into the lungs as needed (for sats less than 92%).     polyethylene glycol powder (GLYCOLAX/MIRALAX) 17 GM/SCOOP powder Take 17 g by mouth daily as needed (constipation).     potassium chloride SA (KLOR-CON) 20 MEQ tablet Take 20 mEq by mouth every Monday, Wednesday, and Friday.     sennosides-docusate sodium (SENOKOT-S) 8.6-50 MG tablet Take 2 tablets by mouth at bedtime.     TOPROL XL 50 MG 24 hr tablet Take 50 mg by mouth in the morning. Take with or immediately following a meal.     TUMS ULTRA 1000 1000 MG chewable tablet Chew 2,000 mg by mouth daily as needed (for indigestion).     No current facility-administered medications on file prior to visit.    Allergies:  No Known Allergies  Physical Exam General: well developed, well nourished, middle-aged African-American lady middle-aged lady seated, in no evident distress Head: head normocephalic and atraumatic.   Neck: supple with no carotid or supraclavicular bruits Cardiovascular: regular rate and rhythm, no murmurs Musculoskeletal: no deformity Skin:  no rash/petichiae Vascular:  Normal pulses all extremities  Neurologic Exam Mental Status: Awake and fully alert. Oriented to place and time. Recent and remote memory intact. Attention span, concentration and fund of knowledge appropriate. Mood and affect appropriate.  Diminished recall 1/3.  Able to name only 8 animals which can walk on 4 legs.   .  Mini-Mental status exam not done today but last visit score 20/30 with deficits in orientation  recall and reading.  Geriatric depression scale not done.  Able to name only  6 animals which can walk and forelegs.  Clock drawing 3/4.  Jaw jerk is  brisk Cranial Nerves: Fundoscopic exam not done. Pupils equal, briskly reactive to light. Extraocular movements full without nystagmus with mild exotropia of the left eye.. Visual fields full to confrontation. Hearing intact. Facial sensation intact. Face, tongue, palate moves normally and symmetrically.  Motor: Normal bulk and slight increased tone on the right .Marland Kitchen Normal strength in all tested extremity muscles.  Diminished fine finger movements on the right.  Orbits left over right upper extremity. Sensory.: intact to touch , pinprick , position and vibratory sensation.  Coordination: Rapid alternating movements normal in all extremities. Finger-to-nose and heel-to-shin performed accurately bilaterally. Gait and Station: Arises from chair without difficulty. Stance is broad-based.   . Gait demonstrates mild ataxia with turns.  Uses a wheeled walker unable to do tandem walking Reflexes: 3+ on the right and 2+ on the left. Toes downgoing.   NIHSS  1 Modified Rankin  3   ASSESSMENT: 65 year old lady with pontine and left subcortical white matter infarcts in March  and October 2022 likely from small vessel disease who also has significant known chronic multivessel extracranial large vessel disease also.  Remote history of left temporal intracerebral hemorrhage in 2015 .vascular risk factors of diabetes, hypertension, hyperlipidemia, large vessel extracranial multivessel stenosis.  She is also complaining of memory loss and mild cognitive impairment poststroke.  She also has vascular dementia and is on suboptimal dose of Aricept     PLAN: I had a long d/w patient  and CNA accompanying her about her recent lacunar strokes and vascular dementia, risk for recurrent stroke/TIAs, personally independently reviewed imaging studies and stroke evaluation results  and answered questions.Continue Plavix 75 mg daily for secondary stroke prevention and maintain strict control of hypertension with blood pressure goal below 130/90, diabetes with hemoglobin A1c goal below 6.5% and lipids with LDL cholesterol goal below 70 mg/dL. I also advised the patient to eat a healthy diet with plenty of whole grains, cereals, fruits and vegetables, exercise regularly and maintain ideal body weight .continue ongoing physical occupational therapy.  Mobilize out of bed as tolerated.  Increase dose of Aricept to 10 mg daily to help with dementia.  Followup in the future with me in future only as needed and no schedule appointment was made..Greater than 50% time during this 35-minute   visit was spent in counseling and coordination of care about her stroke and memory loss and answering questions Antony Contras, MD Note: This document was prepared with digital dictation and possible smart phrase technology. Any transcriptional errors that result from this process are unintentional.

## 2021-09-12 ENCOUNTER — Other Ambulatory Visit (HOSPITAL_COMMUNITY): Payer: Self-pay

## 2021-09-22 ENCOUNTER — Other Ambulatory Visit: Payer: Self-pay

## 2021-09-22 ENCOUNTER — Emergency Department (HOSPITAL_COMMUNITY): Payer: Medicare Other

## 2021-09-22 ENCOUNTER — Emergency Department (HOSPITAL_COMMUNITY)
Admission: EM | Admit: 2021-09-22 | Discharge: 2021-09-23 | Disposition: A | Discharge status: Skilled Nursing Facility | Payer: Medicare Other | Attending: Emergency Medicine | Admitting: Emergency Medicine

## 2021-09-22 DIAGNOSIS — Z87891 Personal history of nicotine dependence: Secondary | ICD-10-CM | POA: Diagnosis not present

## 2021-09-22 DIAGNOSIS — L03115 Cellulitis of right lower limb: Secondary | ICD-10-CM | POA: Insufficient documentation

## 2021-09-22 DIAGNOSIS — Z79899 Other long term (current) drug therapy: Secondary | ICD-10-CM | POA: Insufficient documentation

## 2021-09-22 DIAGNOSIS — E119 Type 2 diabetes mellitus without complications: Secondary | ICD-10-CM | POA: Insufficient documentation

## 2021-09-22 DIAGNOSIS — Z21 Asymptomatic human immunodeficiency virus [HIV] infection status: Secondary | ICD-10-CM | POA: Insufficient documentation

## 2021-09-22 DIAGNOSIS — I10 Essential (primary) hypertension: Secondary | ICD-10-CM | POA: Insufficient documentation

## 2021-09-22 DIAGNOSIS — F0390 Unspecified dementia without behavioral disturbance: Secondary | ICD-10-CM | POA: Insufficient documentation

## 2021-09-22 DIAGNOSIS — Z794 Long term (current) use of insulin: Secondary | ICD-10-CM | POA: Diagnosis not present

## 2021-09-22 DIAGNOSIS — I739 Peripheral vascular disease, unspecified: Secondary | ICD-10-CM | POA: Diagnosis not present

## 2021-09-22 LAB — CBC WITH DIFFERENTIAL/PLATELET
Abs Immature Granulocytes: 0.02 10*3/uL (ref 0.00–0.07)
Basophils Absolute: 0.1 10*3/uL (ref 0.0–0.1)
Basophils Relative: 1 %
Eosinophils Absolute: 0.1 10*3/uL (ref 0.0–0.5)
Eosinophils Relative: 1 %
HCT: 33.8 % — ABNORMAL LOW (ref 36.0–46.0)
Hemoglobin: 11.6 g/dL — ABNORMAL LOW (ref 12.0–15.0)
Immature Granulocytes: 0 %
Lymphocytes Relative: 35 %
Lymphs Abs: 2.6 10*3/uL (ref 0.7–4.0)
MCH: 33.9 pg (ref 26.0–34.0)
MCHC: 34.3 g/dL (ref 30.0–36.0)
MCV: 98.8 fL (ref 80.0–100.0)
Monocytes Absolute: 0.5 10*3/uL (ref 0.1–1.0)
Monocytes Relative: 6 %
Neutro Abs: 4.1 10*3/uL (ref 1.7–7.7)
Neutrophils Relative %: 57 %
Platelets: 322 10*3/uL (ref 150–400)
RBC: 3.42 MIL/uL — ABNORMAL LOW (ref 3.87–5.11)
RDW: 13.6 % (ref 11.5–15.5)
WBC: 7.2 10*3/uL (ref 4.0–10.5)
nRBC: 0 % (ref 0.0–0.2)

## 2021-09-22 LAB — COMPREHENSIVE METABOLIC PANEL
ALT: 16 U/L (ref 0–44)
AST: 23 U/L (ref 15–41)
Albumin: 4 g/dL (ref 3.5–5.0)
Alkaline Phosphatase: 49 U/L (ref 38–126)
Anion gap: 12 (ref 5–15)
BUN: 21 mg/dL (ref 8–23)
CO2: 25 mmol/L (ref 22–32)
Calcium: 9.5 mg/dL (ref 8.9–10.3)
Chloride: 102 mmol/L (ref 98–111)
Creatinine, Ser: 1.23 mg/dL — ABNORMAL HIGH (ref 0.44–1.00)
GFR, Estimated: 49 mL/min — ABNORMAL LOW (ref >60)
Glucose, Bld: 116 mg/dL — ABNORMAL HIGH (ref 70–99)
Potassium: 3.2 mmol/L — ABNORMAL LOW (ref 3.5–5.1)
Sodium: 139 mmol/L (ref 135–145)
Total Bilirubin: 0.6 mg/dL (ref 0.3–1.2)
Total Protein: 7.1 g/dL (ref 6.5–8.1)

## 2021-09-22 LAB — LACTIC ACID, PLASMA: Lactic Acid, Venous: 1.9 mmol/L (ref 0.5–1.9)

## 2021-09-22 MED ORDER — FENTANYL CITRATE PF 50 MCG/ML IJ SOSY
25.0000 ug | PREFILLED_SYRINGE | Freq: Once | INTRAMUSCULAR | Status: AC
  Administered 2021-09-22: 25 ug via INTRAVENOUS
  Filled 2021-09-22: qty 1

## 2021-09-22 MED ORDER — CEPHALEXIN 500 MG PO CAPS: 500.0000 mg | ORAL_CAPSULE | Freq: Two times a day (BID) | ORAL | 0 refills | Status: AC

## 2021-09-22 MED ORDER — IOHEXOL 350 MG/ML SOLN
100.0000 mL | Freq: Once | INTRAVENOUS | Status: AC | PRN
  Administered 2021-09-22: 100 mL via INTRAVENOUS

## 2021-09-22 MED ORDER — SODIUM CHLORIDE 0.9 % IV BOLUS
1000.0000 mL | Freq: Once | INTRAVENOUS | Status: AC
  Administered 2021-09-22: 1000 mL via INTRAVENOUS

## 2021-09-22 MED ORDER — CEPHALEXIN 250 MG PO CAPS
500.0000 mg | ORAL_CAPSULE | Freq: Once | ORAL | Status: AC
  Administered 2021-09-22: 500 mg via ORAL
  Filled 2021-09-22: qty 2

## 2021-09-22 NOTE — ED Provider Notes (Signed)
[facility]   CSN: [id number] Arrival date & time: 09/22/21  1514     History Chief Complaint  Patient presents with   Wound Infection    Lindsey Leon is a 65 y.o. female.  HPI Patient presents from nursing facility with staff concern for wound infection.  Patient notes pain in the right lateral mid ankle.  Pain is transient improved with Tylenol 3, but has become worse over the past few days.  Is unclear how she has preserved distal sensation, but she can move the toes to command.  She notes multiple medical issues including HIV, stroke, with right-sided hemiplegia.  She gets wound care at home, has a dressing in place from yesterday.  Reviewing the patient's chart from nursing facility she also had ABI performed 2 days ago with concern for asymmetry.  Right ABI 0.3, left 0.8.  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   HTN (hypertension) 01/01/2021   Routine screening for STI (sexually transmitted infection) 12/01/2019   Type 2 diabetes mellitus with hyperglycemia (HCC) 02/2019   Dementia (HCC) 12/27/2018   Human immunodeficiency virus (HIV) disease (HCC) 12/25/2018   Carotid artery occlusion 12/25/2018   CVA (cerebral vascular accident) (HCC) 12/25/2018   Chronic hepatitis C without hepatic coma (HCC) 12/2018   TIA (transient ischemic attack) 12/06/2018   ICH (intracerebral hemorrhage) (HCC) 05/23/2014   Diverticulitis of colon 02/24/2013   Pancreatitis, Hx of 02/24/2013   Alcohol abuse  02/24/2013    Past Surgical History:  Procedure Laterality Date   FLEXIBLE SIGMOIDOSCOPY N/A 02/26/2013   Procedure: FLEXIBLE SIGMOIDOSCOPY;  Surgeon: Theda BelfastPatrick D Hung, MD;  [facility];  Service: Endoscopy;  Laterality: N/A;     OB History   No obstetric history on file.     Family History  Problem Relation Age of Onset   Healthy Father    Cancer Mother        passed away. unknown age   Hypertension Sister    Diabetes Mellitus II Sister     Social History   Tobacco Use   Smoking status: Former    Packs/day: 0.50    Years: 30.00    Pack years: 15.00    Types: Cigarettes    Quit date: 05/22/2015    Years since quitting: 6.3   Smokeless tobacco: Never  Vaping Use   Vaping Use: Never used  Substance Use Topics   Alcohol use: No    Alcohol/week: 0.0 standard drinks   Drug use: Not Currently    Types: Marijuana    Home Medications Prior to Admission medications   Medication Sig Start Date End Date Taking? Authorizing Provider  cephALEXin (KEFLEX) 500 MG capsule Take 1 capsule (500 mg total) by mouth 2 (two) times daily for 5 days. 09/22/21 09/27/21 Yes Gerhard MunchLockwood, Sylus Stgermain, MD  acetaminophen (TYLENOL) 325 MG tablet Take 650 mg by mouth every 12 (twelve) hours as needed (pain).  [provider]  albuterol (VENTOLIN HFA) 108 (90 Base) MCG/ACT inhaler Inhale 2 puffs into the lungs every 6 (six) hours as needed for shortness of breath.    [provider]  atorvastatin (LIPITOR) 10 MG tablet Take 10 mg by mouth every morning. 01/24/21 01/24/22  [provider]  B Complex Vitamins (VITAMIN B COMPLEX) TABS Take 1 tablet by mouth every morning.    [provider]  BIKTARVY 50-200-25 MG TABS tablet TAKE 1 TABLET BY MOUTH EVERY DAY Patient taking differently: Take 1 tablet by mouth in the morning. 07/28/21   Comer, Okey Regal, MD  chlorthalidone (HYGROTON) 25 MG tablet Take 25 mg by mouth every morning.    [provider]   Cholecalciferol (VITAMIN D) 50 MCG (2000 UT) tablet Take 2,000 Units by mouth every morning.    [provider]  clopidogrel (PLAVIX) 75 MG tablet Take 1 tablet (75 mg total) by mouth daily. 08/31/21   Garvin Fila, MD  donepezil (ARICEPT) 5 MG tablet Take 10 mg by mouth at bedtime.    [provider]  famotidine (PEPCID) 20 MG tablet Take 20 mg by mouth daily before breakfast.    [provider]  furosemide (LASIX) 20 MG tablet Take 20 mg by mouth daily as needed (shortness of breath).    [provider]  gabapentin (NEURONTIN) 100 MG capsule Take 100 mg by mouth at bedtime.    [provider]  gabapentin (NEURONTIN) 300 MG capsule Take 300 mg by mouth every morning.    [provider]  gabapentin (NEURONTIN) 800 MG tablet Take 800 mg by mouth at bedtime.    [provider]  insulin glargine (LANTUS) 100 UNIT/ML injection Inject 13 Units into the skin at bedtime.    [provider]  Lidocaine-Menthol (ICY HOT MAX) 4-1 % AERO Apply 1 application topically daily as needed (pain).    [provider]  losartan (COZAAR) 100 MG tablet Take 100 mg by mouth daily at 12 noon.    [provider]  Menthol, Topical Analgesic, 5 % GEL Apply 1 application topically every 12 (twelve) hours as needed (right foot and heel pain).    [provider]  Omega-3 Fatty Acids (FISH OIL) 1000 MG CAPS Take 1,000 mg by mouth every morning.    [provider]  OXYGEN Inhale 2 L/min into the lungs as needed (for sats less than 92%).    [provider]  polyethylene glycol powder (GLYCOLAX/MIRALAX) 17 GM/SCOOP powder Take 17 g by mouth daily as needed (constipation).    [provider]  potassium chloride SA (KLOR-CON) 20 MEQ tablet Take 20 mEq by mouth every Monday, Wednesday, and Friday. 05/17/21   [provider]  sennosides-docusate sodium (SENOKOT-S) 8.6-50 MG tablet Take 2 tablets by  mouth at bedtime.    [provider]  TOPROL XL 50 MG 24 hr tablet Take 50 mg by mouth in the morning. Take with or immediately following a meal.    [provider]  TUMS ULTRA 1000 1000 MG chewable tablet Chew 2,000 mg by mouth daily as needed (for indigestion).    [provider]    Allergies    Patient has no known allergies.  Review of Systems   Review of Systems  Constitutional:        Per HPI, otherwise negative  HENT:         Per HPI, otherwise negative  Respiratory:  Per HPI, otherwise negative  Cardiovascular:        Per HPI, otherwise negative  Gastrointestinal:  Negative for vomiting.  Endocrine:       Negative aside from HPI  Genitourinary:        Neg aside from HPI   Musculoskeletal:        Per HPI, otherwise negative  Skin:  Positive for wound.  Neurological:  Positive for weakness. Negative for syncope.   Physical Exam Updated Vital Signs BP 136/78 (BP Location: Right Arm)   Pulse 84   Temp 98.4 F (36.9 C) (Oral)   Resp 20   SpO2 100%   Physical Exam Vitals and nursing note reviewed.  Constitutional:      Appearance: She is well-developed.     Comments: Chronically ill-appearing female in no obvious distress  HENT:     Head: Normocephalic and atraumatic.  Eyes:     Conjunctiva/sclera: Conjunctivae normal.  Cardiovascular:     Rate and Rhythm: Normal rate and regular rhythm.  Pulmonary:     Effort: Pulmonary effort is normal. No respiratory distress.     Breath sounds: Normal breath sounds. No stridor.  Abdominal:     General: There is no distension.  Musculoskeletal:       Legs:  Skin:    General: Skin is warm and dry.  Neurological:     Mental Status: She is alert.     Cranial Nerves: No cranial nerve deficit.     Motor: Weakness and atrophy present. No tremor.    ED Results / Procedures / Treatments   Labs (all labs ordered are listed, but only abnormal results are displayed) Labs Reviewed   COMPREHENSIVE METABOLIC PANEL - Abnormal; Notable for the following components:      Result Value   Potassium 3.2 (*)    Glucose, Bld 116 (*)    Creatinine, Ser 1.23 (*)    GFR, Estimated 49 (*)    All other components within normal limits  CBC WITH DIFFERENTIAL/PLATELET - Abnormal; Notable for the following components:   RBC 3.42 (*)    Hemoglobin 11.6 (*)    HCT 33.8 (*)    All other components within normal limits  LACTIC ACID, PLASMA  LACTIC ACID, PLASMA    EKG None  Radiology DG Ankle Complete Right  Result Date: 09/22/2021 CLINICAL DATA:  Wound right lateral malleolus EXAM: RIGHT ANKLE - COMPLETE 3+ VIEW COMPARISON:  None. FINDINGS: Normal alignment. No fracture or osteomyelitis. Lateral soft tissue wound. IMPRESSION: No acute skeletal abnormality Electronically Signed   By: Franchot Gallo M.D.   On: 09/22/2021 16:13   CT Angio Aortobifemoral W and/or Wo Contrast  Result Date: 09/22/2021 CLINICAL DATA:  Nonhealing right ankle wound with decreased peripheral pulses, initial encounter EXAM: CT ANGIOGRAPHY OF DISTAL ABDOMINAL AORTA WITH ILIOFEMORAL RUNOFF TECHNIQUE: Multidetector CT imaging of the abdomen, pelvis and lower extremities was performed using the standard protocol during bolus administration of intravenous contrast. Multiplanar CT image reconstructions and MIPs were obtained to evaluate the vascular anatomy. CONTRAST:  121mL OMNIPAQUE IOHEXOL 350 MG/ML SOLN COMPARISON:  None. FINDINGS: VASCULAR Aorta: Abdominal aorta demonstrates atherosclerotic calcifications. No aneurysmal dilatation is seen. IMA: Atherosclerotic calcifications are noted although the IMA is patent. RIGHT Lower Extremity Inflow: Iliac artery demonstrates diffuse atherosclerotic calcifications. No focal significant stenosis is identified. The common femoral artery is patent. Runoff: Common femoral bifurcation demonstrates moderate narrowing at the origin of the superficial femoral artery with associated  multifocal moderate to severe areas  of narrowing throughout the right thigh. High-grade stenosis in the proximal popliteal artery is noted as well. Heavy calcific disease is seen within the popliteal artery. The distal popliteal artery is occluded and there is reconstitution of the infrapopliteal vessels identified. Peroneal artery is diminutive. The anterior tibial artery is visualized into the mid to distal calf although it is more distal component is not visualized and may be related to occlusion. Posterior tibial artery continues into the foot. LEFT Lower Extremity Inflow: Iliac arteries demonstrate atherosclerotic calcification without focal hemodynamically significant stenosis. Common femoral artery demonstrates moderate stenosis. Runoff: Occlusion of the superficial femoral artery on the left is noted just beyond its origin. Reconstitution just above the adductor canal is noted for a short segment although occlusion extending into the proximal popliteal artery is noted. Reconstitution of the popliteal artery is noted with multiple tandem areas of stenosis identified. The distal popliteal artery is occluded similar to that seen on the right. Reconstitution just above the popliteal trifurcation is noted. Three-vessel runoff to the mid to distal left calf is noted with the posterior tibial artery continuing into foot. Veins: No specific venous abnormality is noted. Review of the MIP images confirms the above findings. NON-VASCULAR Lower chest: Not included on this exam Hepatobiliary: Not included Pancreas: Not included Spleen: Not included Adrenals/Urinary Tract: Adrenal glands and kidneys are not included on this exam. The bladder is partially distended. Stomach/Bowel: Diverticular change of the sigmoid colon is noted. No obstructive or inflammatory changes are seen. The appendix is within normal limits. Visualized small bowel is unremarkable. Lymphatic: No significant lymphadenopathy is identified.  Reproductive: Uterus is well visualized with calcified uterine fibroids. No adnexal mass is noted. Other: No free fluid is seen. Musculoskeletal: Degenerative changes of the visualized lumbar spine are seen. No acute fracture or dislocation is noted in the lower extremities. Mild soft tissue swelling in the right lower extremity is seen laterally. No discrete bony erosive changes to suggest osteomyelitis are seen. IMPRESSION: VASCULAR Atherosclerotic changes are noted in the lower extremities bilaterally. Multifocal tandem stenoses are noted on the right throughout the superficial femoral artery and popliteal artery with subsequent occlusion of the distal aspect of the popliteal artery. Reconstitution of the popliteal trifurcation is seen with single-vessel runoff into the right foot via the posterior tibial artery. Similar atherosclerotic changes on the left with occlusion of the distal popliteal artery similar to that noted on the right. Reconstitution of the popliteal trifurcation is noted with single-vessel runoff via the posterior tibial artery into the foot. NON-VASCULAR Diverticulosis without diverticulitis. Soft tissue swelling in the right lower extremity about the ankle consistent with the recent history. No bony erosive changes to suggest osteomyelitis are noted. Electronically Signed   By: Alcide Clever M.D.   On: 09/22/2021 19:33    Procedures Procedures   Medications Ordered in ED Medications  sodium chloride 0.9 % bolus 1,000 mL (0 mLs Intravenous Stopped 09/22/21 1746)  fentaNYL (SUBLIMAZE) injection 25 mcg (25 mcg Intravenous Given 09/22/21 1717)  iohexol (OMNIPAQUE) 350 MG/ML injection 100 mL (100 mLs Intravenous Contrast Given 09/22/21 1901)  cephALEXin (KEFLEX) capsule 500 mg (500 mg Oral Given 09/22/21 2049)  fentaNYL (SUBLIMAZE) injection 25 mcg (25 mcg Intravenous Given 09/22/21 2132)    ED Course  I have reviewed the triage vital signs and the nursing notes.  Pertinent labs &  imaging results that were available during my care of the patient were reviewed by me and considered in my medical decision making (see chart for  details).  Given the patient's abnormal ABI, diminished pulses on the right and open wound with erythema CT angiography with runoff for perfusion was ordered.  X-ray, labs also ordered.  Patient received fluid resuscitation given initially low blood pressure, though she was not tachycardic, suggesting this may be chronic.   Exam patient in no distress.  I reviewed her CT scan and discussed it with our vascular team.  She does have palpable pulses, cap refill, though to sluggish.  She is on Plavix.  We discussed importance of outpatient follow-up which is noted to be appropriate for her.  Labs reviewed, no leukocytosis, she is afebrile, and the wound itself looks okay, no evidence for osteomyelitis on x-ray either.  Some suspicion for cellulitis complicated by her CAD contributing to the presentation.  With no evidence of bacteremia, sepsis either, patient discharged to her nursing facility. MDM Rules/Calculators/A&P MDM Number of Diagnoses or Management Options Cellulitis of right lower extremity: new, needed workup PAD (peripheral artery disease) ([place]): established, worsening   Amount and/or Complexity of Data Reviewed Clinical lab tests: ordered and reviewed Tests in the radiology section of CPT: ordered and reviewed Tests in the medicine section of CPT: reviewed and ordered Decide to obtain previous medical records or to obtain history from someone other than the patient: yes Obtain history from someone other than the patient: yes Review and summarize past medical records: yes Discuss the patient with other providers: yes Independent visualization of images, tracings, or specimens: yes  Risk of Complications, Morbidity, and/or Mortality Presenting problems: high Diagnostic procedures: high Management options: high  Critical Care Total time  providing critical care: < 30 minutes  Patient Progress Patient progress: stable   Final Clinical Impression(s) / ED Diagnoses Final diagnoses:  Cellulitis of right lower extremity  PAD (peripheral artery disease) ([place])    Rx / DC Orders ED Discharge Orders          Ordered    cephALEXin (KEFLEX) 500 MG capsule  2 times daily        09/22/21 2245             Carmin Muskrat, MD 09/22/21 2322

## 2021-09-22 NOTE — Discharge Instructions (Signed)
You have been diagnosed with cellulitis and peripheral arterial disease.  Your testing at the facility and again today suggests the blood flow to your right lower extremity is diminished.  This is likely contributing to the slow healing for your wound.  In addition there is some concern for skin infection or cellulitis occurring as well.  Please obtain and take your antibiotics as directed and be sure to follow-up with our vascular surgery colleagues as well as with your primary care physician.  Return here for concerning changes in your condition.

## 2021-09-22 NOTE — ED Notes (Signed)
Patient transported to CT 

## 2021-09-22 NOTE — ED Triage Notes (Signed)
Pt bib [facility] from [place] for a rt ankle. Pt has had a rt ankle wound that wont heal. Physical therapy did water displacement therapy today at the facility. Pt is experiencing increased pain and swelling. Pt usually gets around with a walker but is only able to stand and pivot right now.   EMS vitals 118/90 BP 68 HR 93 SpO2

## 2021-09-22 NOTE — ED Notes (Addendum)
Report given to [facility] RN at [facility] on patient's condition ,discharge and return to the facility . [facility] notified by [employer] operational officer .

## 2021-09-22 NOTE — ED Notes (Signed)
[facility] CALLED  °

## 2021-09-23 ENCOUNTER — Telehealth: Payer: Self-pay | Admitting: *Deleted

## 2021-09-23 NOTE — Telephone Encounter (Signed)
[facility] called regarding receiving a Rx for pt who is not in their system.

## 2021-09-23 NOTE — ED Notes (Signed)
Pt incontinent of urine, new brief changed prior to transport to [facility]

## 2021-09-29 NOTE — Progress Notes (Signed)
Office Note     CC:  Foot wounds with nonpalpable pulses Requesting Provider:  Karna Dupes, MD  HPI: Lindsey Leon is Leon 65 y.o. (1956-10-02) female presenting at the request of .Lindsey Mounts A, MD for lower extremity wounds with nonpalpable pulses.  Patient has Leon history of severe atherosclerotic disease affecting multiple vascular beds.  From Leon cerebrovascular standpoint she has an occluded right common carotid artery.  There is disease on the left side as well, with history of stroke in October of this year.  Patient is wheelchair-bound, total care and assist.  She is incontinent and wears diapers at baseline with significant cognitive decline.   Patient presents today accompanied by her aide.  The wounds on her right foot have been present for roughly 1 month.  She is unsure as to the etiology.  Since her stroke in October, [name]'s independence has declined significantly.  She is currently Leon two-assist to stand.  Veda and her aide deny erythema, induration, fever, chills.  She has appreciated edema in the right foot which is painful to the touch.   Prince was receiving physical therapy at her skilled nursing facility, however she is unsure as to whether she is still receiving it.  The pt is  on Leon statin for cholesterol management.  The pt is not on Leon daily aspirin.   Other AC:  - The pt is  on medication for hypertension.   The pt is  diabetic.  Tobacco hx:  former  Past Medical History:  Diagnosis Date   Chronic hepatitis C (HCC)    dx'ed in 12/2018   Dementia [facility])    Diverticulitis    HIV infection (HCC)    dx'ed in 12/2018   Hypertension    not on medications   Pancreatitis    Stroke [facility])     Past Surgical History:  Procedure Laterality Date   FLEXIBLE SIGMOIDOSCOPY N/Leon 02/26/2013   Procedure: FLEXIBLE SIGMOIDOSCOPY;  Surgeon: Theda Belfast, MD;  Location: [facility] ENDOSCOPY;  Service: Endoscopy;  Laterality: N/Leon;    Social History   Socioeconomic History    Marital status: Single    Spouse name: Not on file   Number of children: 1   Years of education: 12   Highest education level: Not on file  Occupational History   Not on file  Tobacco Use   Smoking status: Former    Packs/day: 0.50    Years: 30.00    Pack years: 15.00    Types: Cigarettes    Quit date: 05/22/2015    Years since quitting: 6.3   Smokeless tobacco: Never  Vaping Use   Vaping Use: Never used  Substance and Sexual Activity   Alcohol use: No    Alcohol/week: 0.0 standard drinks   Drug use: Not Currently    Types: Marijuana   Sexual activity: Never  Other Topics Concern   Not on file  Social History Narrative   Lives att [place] facility    Patient is right handed.   Drinks no caffeine daily   Social Determinants of Corporate investment banker Strain: Not on file  Food Insecurity: Not on file  Transportation Needs: Not on file  Physical Activity: Not on file  Stress: Not on file  Social Connections: Not on file  Intimate Partner Violence: Not on file    Family History  Problem Relation Age of Onset   Healthy Father    Cancer Mother  passed away. unknown age   Hypertension Sister    Diabetes Mellitus II Sister     Current Outpatient Medications  Medication Sig Dispense Refill   acetaminophen (TYLENOL) 325 MG tablet Take 650 mg by mouth every 12 (twelve) hours as needed (pain).     acetaminophen-codeine (TYLENOL #3) 300-30 MG tablet Take 1 tablet by mouth See admin instructions. Qd x 2 months     albuterol (VENTOLIN HFA) 108 (90 Base) MCG/ACT inhaler Inhale 2 puffs into the lungs every 6 (six) hours as needed for shortness of breath.     atorvastatin (LIPITOR) 10 MG tablet Take 10 mg by mouth every morning.     B Complex Vitamins (VITAMIN B COMPLEX) TABS Take 1 tablet by mouth every morning.     BIKTARVY 50-200-25 MG TABS tablet TAKE 1 TABLET BY MOUTH EVERY DAY (Patient taking differently: Take 1 tablet by mouth in the morning.) 30 tablet 5    chlorthalidone (HYGROTON) 25 MG tablet Take 25 mg by mouth every morning. (Patient not taking: Reported on 09/23/2021)     chlorthalidone (HYGROTON) 25 MG tablet Take 12.5 mg by mouth daily.     chlorthalidone (HYGROTON) 25 MG tablet Take 12.5 mg by mouth daily as needed (SBP >140).     Cholecalciferol (VITAMIN D) 50 MCG (2000 UT) tablet Take 2,000 Units by mouth every morning.     clopidogrel (PLAVIX) 75 MG tablet Take 1 tablet (75 mg total) by mouth daily. 30 tablet 11   donepezil (ARICEPT) 5 MG tablet Take 10 mg by mouth at bedtime.     famotidine (PEPCID) 20 MG tablet Take 20 mg by mouth daily before breakfast.     furosemide (LASIX) 20 MG tablet Take 20 mg by mouth daily as needed (shortness of breath).     gabapentin (NEURONTIN) 100 MG capsule Take 100 mg by mouth at bedtime.     gabapentin (NEURONTIN) 400 MG capsule Take 400 mg by mouth every morning.     gabapentin (NEURONTIN) 800 MG tablet Take 800 mg by mouth at bedtime.     insulin glargine (LANTUS) 100 UNIT/ML injection Inject 13 Units into the skin at bedtime.     Lidocaine-Menthol (ICY HOT MAX) 4-1 % AERO Apply 1 application topically daily as needed (pain).     losartan (COZAAR) 100 MG tablet Take 100 mg by mouth daily at 12 noon.     Menthol, Topical Analgesic, 5 % GEL Apply 1 application topically every 12 (twelve) hours as needed (right foot and heel pain).     Omega-3 Fatty Acids (FISH OIL) 1000 MG CAPS Take 1,000 mg by mouth every morning.     OXYGEN Inhale 2 L/min into the lungs as needed (for sats less than 92%).     oxymetazoline (AFRIN) 0.05 % nasal spray Place 2 sprays into both nostrils 2 (two) times daily as needed (uncontrollable nose bleeds).     polyethylene glycol powder (GLYCOLAX/MIRALAX) 17 GM/SCOOP powder Take 17 g by mouth daily as needed (constipation).     potassium chloride SA (KLOR-CON) 20 MEQ tablet Take 20 mEq by mouth every Monday, Wednesday, and Friday.     sennosides-docusate sodium (SENOKOT-S) 8.6-50 MG  tablet Take 2 tablets by mouth at bedtime.     TOPROL XL 50 MG 24 hr tablet Take 50 mg by mouth in the morning. Take with or immediately following Leon meal.     TUMS ULTRA 1000 1000 MG chewable tablet Chew 2,000 mg by mouth daily as needed (  for indigestion).     No current facility-administered medications for this visit.    No Known Allergies   REVIEW OF SYSTEMS:   [X]  denotes positive finding, [ ]  denotes negative finding Cardiac  Comments:  Chest pain or chest pressure:    Shortness of breath upon exertion:    Short of breath when lying flat:    Irregular heart rhythm:        Vascular    Pain in calf, thigh, or hip brought on by ambulation:    Pain in feet at night that wakes you up from your sleep:     Blood clot in your veins:    Leg swelling:         Pulmonary    Oxygen at home:    Productive cough:     Wheezing:         Neurologic    Sudden weakness in arms or legs:     Sudden numbness in arms or legs:     Sudden onset of difficulty speaking or slurred speech:    Temporary loss of vision in one eye:     Problems with dizziness:         Gastrointestinal    Blood in stool:     Vomited blood:         Genitourinary    Burning when urinating:     Blood in urine:        Psychiatric    Major depression:         Hematologic    Bleeding problems:    Problems with blood clotting too easily:        Skin    Rashes or ulcers:        Constitutional    Fever or chills:      PHYSICAL EXAMINATION:  There were no vitals filed for this visit.  General:  WDWN in NAD; vital signs documented above Gait: Not observed HENT: WNL, normocephalic Pulmonary: normal non-labored breathing , without wheezing Cardiac: regular HR, Abdomen: soft, NT, no masses Skin: without rashes Vascular Exam/Pulses:  Right Left  Radial 2+ (normal) 2+ (normal)  Ulnar 2+ (normal) 2+ (normal)  Femoral 1+ (weak) 1+ (weak)  Popliteal    DP absent absent  PT absent absent   Extremities:  without ischemic changes, without Gangrene , without cellulitis; with open wounds - lateral aspect of the right leg, likley from edema, toe blister from edema Right sided hemiplegia Musculoskeletal: no muscle wasting or atrophy  Neurologic: Leon&O X 3;  No focal weakness or paresthesias are detected Psychiatric:  The pt has  Leon depressed  affect.   Non-Invasive Vascular Imaging:       RIGHT Lower Extremity   Inflow: Iliac artery demonstrates diffuse atherosclerotic calcifications. No focal significant stenosis is identified. The common femoral artery is patent.   Runoff: Common femoral bifurcation demonstrates moderate narrowing at the origin of the superficial femoral artery with associated multifocal moderate to severe areas of narrowing throughout the right thigh. High-grade stenosis in the proximal popliteal artery is noted as well. Heavy calcific disease is seen within the popliteal artery. The distal popliteal artery is occluded and there is reconstitution of the infrapopliteal vessels identified. Peroneal artery is diminutive. The anterior tibial artery is visualized into the mid to distal calf although it is more distal component is not visualized and may be related to occlusion. Posterior tibial artery continues into the foot.   LEFT Lower Extremity   Inflow: Iliac arteries  demonstrate atherosclerotic calcification without focal hemodynamically significant stenosis. Common femoral artery demonstrates moderate stenosis.   Runoff: Occlusion of the superficial femoral artery on the left is noted just beyond its origin. Reconstitution just above the adductor canal is noted for Leon short segment although occlusion extending into the proximal popliteal artery is noted. Reconstitution of the popliteal artery is noted with multiple tandem areas of stenosis identified. The distal popliteal artery is occluded similar to that seen on the right. Reconstitution just above the  popliteal trifurcation is noted. Three-vessel runoff to the mid to distal left calf is noted with the posterior tibial artery continuing into foot.  ABI: 0.3 / 0.8    ASSESSMENT/PLAN: JONAE COBLE is Leon 65 y.o. female presenting with right foot wounds which have been present for roughly one month.  No sign of infection. Significant edema from being the the dependent position. She does not have an ABI documented by our office however her nursing home states she has had an ABI in the past being 0.3 and 0.8 on the right and left respectively.    I had Leon long discussion with Annalin, and being that she is no longer ambulatory s/p stroke, she is not Leon revascularization candidate.  While Leon series of surgeries may improve right lower extremity blood flow and heal the wound, it will not improve her overall functional status, and would not lead to ambulation. Her comorbidities would make any intervention very high risk, and  surgery would likely further dampen her activity.  We also discussed her cerebrovascular disease, including her left-sided stenosis with known right-sided occlusion.  I called Dr. Leonie Man regarding her lacunar stroke.  The etiology is presumed to be small vessel disease.  Due to her current clinical status, we both agreed that she would be best suited for continued medical management rather than cerebrovascular revascularization, even if her percent stenosis became greater than 50%.  I personally measured her left-sided ICA stenosis from CT angio head neck on 07/22/2021 using NASCET criteria and found the stenosis to be less than 50%.  I plan to call her skilled nursing facility to provide Leon clearer clinical picture, especially regarding her ambulatory status, as she became angry during our visit when hearing she was not Leon revascularization candidate. I was very appreciative of the aids help during her visit. I asked that she return to clinic in the coming weeks with ABI to further our  discussion to give me time to speak to her facility.  Broadus John, MD [facility] [phone]

## 2021-09-30 ENCOUNTER — Other Ambulatory Visit: Payer: Self-pay

## 2021-09-30 ENCOUNTER — Other Ambulatory Visit: Payer: Self-pay | Admitting: *Deleted

## 2021-09-30 ENCOUNTER — Ambulatory Visit (INDEPENDENT_AMBULATORY_CARE_PROVIDER_SITE_OTHER): Payer: Medicare Other | Admitting: Vascular Surgery

## 2021-09-30 ENCOUNTER — Encounter: Payer: Self-pay | Admitting: Vascular Surgery

## 2021-09-30 VITALS — BP 117/60 | HR 58 | Temp 97.8°F | Resp 20 | Ht 66.0 in | Wt 169.0 lb

## 2021-09-30 DIAGNOSIS — I70221 Atherosclerosis of native arteries of extremities with rest pain, right leg: Secondary | ICD-10-CM

## 2021-09-30 DIAGNOSIS — I6523 Occlusion and stenosis of bilateral carotid arteries: Secondary | ICD-10-CM

## 2021-10-03 ENCOUNTER — Other Ambulatory Visit: Payer: Self-pay

## 2021-10-03 DIAGNOSIS — I70221 Atherosclerosis of native arteries of extremities with rest pain, right leg: Secondary | ICD-10-CM

## 2021-10-03 DIAGNOSIS — I6523 Occlusion and stenosis of bilateral carotid arteries: Secondary | ICD-10-CM

## 2021-10-05 ENCOUNTER — Ambulatory Visit: Payer: Medicare Other | Admitting: Adult Health

## 2021-10-13 ENCOUNTER — Ambulatory Visit (HOSPITAL_COMMUNITY): Payer: Medicare Other

## 2021-10-13 NOTE — Progress Notes (Signed)
Office Note    HPI: Lindsey Leon is a 65 y.o. (22-Nov-1955) female presenting in follow-up with right lower extremity wounds.  In brief, Patient has a history of severe atherosclerotic disease affecting multiple vascular beds.  From a cerebrovascular standpoint she has an occluded right common carotid artery.  There is disease on the left side as well, with history of stroke in October of this year.  Patient is wheelchair-bound, total care and assist.  She is incontinent and wears diapers at baseline with significant cognitive decline.  At her last visit, we discussed her cerebrovascular disease as well as left lower extremity disease.  At that time, I did not offer intervention, due to the patient's current clinical status.  I told her that I would contact her nursing home to further review her physical therapy regimen as well as assess her improvement versus decline.  Patient presents today accompanied by her aide.  She has been doing well over the last 2 weeks, denies fevers, chills.  Unfortunately Lindsey Leon has pain with leg elevation, so sits in the dependent position for most of the day.  This is led to significant edema and bulla on the dorsal aspect of her right forefoot as well as toes.  The pt is  on a statin for cholesterol management.  The pt is not on a daily aspirin.   Other AC:  - The pt is  on medication for hypertension.   The pt is  diabetic.  Tobacco hx:  former  Past Medical History:  Diagnosis Date   Chronic hepatitis C ([place])    dx'ed in 12/2018   Dementia [facility])    Diverticulitis    HIV infection ([place])    dx'ed in 12/2018   Hypertension    not on medications   Pancreatitis    Stroke [facility])     Past Surgical History:  Procedure Laterality Date   FLEXIBLE SIGMOIDOSCOPY N/A 02/26/2013   Procedure: FLEXIBLE SIGMOIDOSCOPY;  Surgeon: Beryle Beams, MD;  Location: [place];  Service: Endoscopy;  Laterality: N/A;    Social History   Socioeconomic History   Marital  status: Single    Spouse name: Not on file   Number of children: 1   Years of education: 12   Highest education level: Not on file  Occupational History   Not on file  Tobacco Use   Smoking status: Former    Packs/day: 0.50    Years: 30.00    Pack years: 15.00    Types: Cigarettes    Quit date: 05/22/2015    Years since quitting: 6.4   Smokeless tobacco: Never  Vaping Use   Vaping Use: Never used  Substance and Sexual Activity   Alcohol use: No    Alcohol/week: 0.0 standard drinks   Drug use: Not Currently    Types: Marijuana   Sexual activity: Never  Other Topics Concern   Not on file  Social History Narrative   Lives att [place] facility    Patient is right handed.   Drinks no caffeine daily   Social Determinants of Radio broadcast assistant Strain: Not on file  Food Insecurity: Not on file  Transportation Needs: Not on file  Physical Activity: Not on file  Stress: Not on file  Social Connections: Not on file  Intimate Partner Violence: Not on file    Family History  Problem Relation Age of Onset   Healthy Father    Cancer Mother  passed away. unknown age   Hypertension Sister    Diabetes Mellitus II Sister     Current Outpatient Medications  Medication Sig Dispense Refill   acetaminophen (TYLENOL) 325 MG tablet Take 650 mg by mouth every 12 (twelve) hours as needed (pain).     acetaminophen-codeine (TYLENOL #3) 300-30 MG tablet Take 1 tablet by mouth See admin instructions. Qd x 2 months     albuterol (VENTOLIN HFA) 108 (90 Base) MCG/ACT inhaler Inhale 2 puffs into the lungs every 6 (six) hours as needed for shortness of breath.     atorvastatin (LIPITOR) 10 MG tablet Take 10 mg by mouth every morning.     B Complex Vitamins (VITAMIN B COMPLEX) TABS Take 1 tablet by mouth every morning.     BIKTARVY 50-200-25 MG TABS tablet TAKE 1 TABLET BY MOUTH EVERY DAY (Patient taking differently: Take 1 tablet by mouth in the morning.) 30 tablet 5    chlorthalidone (HYGROTON) 25 MG tablet Take 25 mg by mouth every morning.     chlorthalidone (HYGROTON) 25 MG tablet Take 12.5 mg by mouth daily.     chlorthalidone (HYGROTON) 25 MG tablet Take 12.5 mg by mouth daily as needed (SBP >140).     Cholecalciferol (VITAMIN D) 50 MCG (2000 UT) tablet Take 2,000 Units by mouth every morning.     clopidogrel (PLAVIX) 75 MG tablet Take 1 tablet (75 mg total) by mouth daily. 30 tablet 11   donepezil (ARICEPT) 5 MG tablet Take 10 mg by mouth at bedtime.     famotidine (PEPCID) 20 MG tablet Take 20 mg by mouth daily before breakfast.     furosemide (LASIX) 20 MG tablet Take 20 mg by mouth daily as needed (shortness of breath).     gabapentin (NEURONTIN) 100 MG capsule Take 100 mg by mouth at bedtime.     gabapentin (NEURONTIN) 400 MG capsule Take 400 mg by mouth every morning.     gabapentin (NEURONTIN) 800 MG tablet Take 800 mg by mouth at bedtime.     insulin glargine (LANTUS) 100 UNIT/ML injection Inject 13 Units into the skin at bedtime.     Lidocaine-Menthol (ICY HOT MAX) 4-1 % AERO Apply 1 application topically daily as needed (pain).     losartan (COZAAR) 100 MG tablet Take 100 mg by mouth daily at 12 noon.     Menthol, Topical Analgesic, 5 % GEL Apply 1 application topically every 12 (twelve) hours as needed (right foot and heel pain).     Omega-3 Fatty Acids (FISH OIL) 1000 MG CAPS Take 1,000 mg by mouth every morning.     OXYGEN Inhale 2 L/min into the lungs as needed (for sats less than 92%).     oxymetazoline (AFRIN) 0.05 % nasal spray Place 2 sprays into both nostrils 2 (two) times daily as needed (uncontrollable nose bleeds).     polyethylene glycol powder (GLYCOLAX/MIRALAX) 17 GM/SCOOP powder Take 17 g by mouth daily as needed (constipation).     potassium chloride SA (KLOR-CON) 20 MEQ tablet Take 20 mEq by mouth every Monday, Wednesday, and Friday.     sennosides-docusate sodium (SENOKOT-S) 8.6-50 MG tablet Take 2 tablets by mouth at bedtime.      TOPROL XL 50 MG 24 hr tablet Take 50 mg by mouth in the morning. Take with or immediately following a meal.     TUMS ULTRA 1000 1000 MG chewable tablet Chew 2,000 mg by mouth daily as needed (for indigestion).  No current facility-administered medications for this visit.    No Known Allergies   REVIEW OF SYSTEMS:   [X]  denotes positive finding, [ ]  denotes negative finding Cardiac  Comments:  Chest pain or chest pressure:    Shortness of breath upon exertion:    Short of breath when lying flat:    Irregular heart rhythm:        Vascular    Pain in calf, thigh, or hip brought on by ambulation:    Pain in feet at night that wakes you up from your sleep:     Blood clot in your veins:    Leg swelling:         Pulmonary    Oxygen at home:    Productive cough:     Wheezing:         Neurologic    Sudden weakness in arms or legs:     Sudden numbness in arms or legs:     Sudden onset of difficulty speaking or slurred speech:    Temporary loss of vision in one eye:     Problems with dizziness:         Gastrointestinal    Blood in stool:     Vomited blood:         Genitourinary    Burning when urinating:     Blood in urine:        Psychiatric    Major depression:         Hematologic    Bleeding problems:    Problems with blood clotting too easily:        Skin    Rashes or ulcers:        Constitutional    Fever or chills:      PHYSICAL EXAMINATION:  There were no vitals filed for this visit.  General:  WDWN in NAD; vital signs documented above Gait: Not observed HENT: WNL, normocephalic Pulmonary: normal non-labored breathing , without wheezing Cardiac: regular HR, Abdomen: soft, NT, no masses Skin: without rashes Vascular Exam/Pulses:  Right Left  Radial 2+ (normal) 2+ (normal)  Ulnar 2+ (normal) 2+ (normal)  Femoral 1+ (weak) 1+ (weak)  Popliteal    DP absent absent  PT absent absent   Extremities: without ischemic changes, without Gangrene ,  without cellulitis; with open wounds - lateral aspect of the right leg, likley from edema, toe blister from edema Right sided hemiplegia Musculoskeletal: no muscle wasting or atrophy  Neurologic: A&O X 3;  No focal weakness or paresthesias are detected Psychiatric:  The pt has  a depressed  affect.   Non-Invasive Vascular Imaging:       RIGHT Lower Extremity   Inflow: Iliac artery demonstrates diffuse atherosclerotic calcifications. No focal significant stenosis is identified. The common femoral artery is patent.   Runoff: Common femoral bifurcation demonstrates moderate narrowing at the origin of the superficial femoral artery with associated multifocal moderate to severe areas of narrowing throughout the right thigh. High-grade stenosis in the proximal popliteal artery is noted as well. Heavy calcific disease is seen within the popliteal artery. The distal popliteal artery is occluded and there is reconstitution of the infrapopliteal vessels identified. Peroneal artery is diminutive. The anterior tibial artery is visualized into the mid to distal calf although it is more distal component is not visualized and may be related to occlusion. Posterior tibial artery continues into the foot.   LEFT Lower Extremity   Inflow: Iliac arteries demonstrate atherosclerotic calcification without focal hemodynamically  significant stenosis. Common femoral artery demonstrates moderate stenosis.   Runoff: Occlusion of the superficial femoral artery on the left is noted just beyond its origin. Reconstitution just above the adductor canal is noted for a short segment although occlusion extending into the proximal popliteal artery is noted. Reconstitution of the popliteal artery is noted with multiple tandem areas of stenosis identified. The distal popliteal artery is occluded similar to that seen on the right. Reconstitution just above the popliteal trifurcation is noted. Three-vessel runoff  to the mid to distal left calf is noted with the posterior tibial artery continuing into foot.  ABI: 0.3 / 0.8    ASSESSMENT/PLAN: NADALIE LAUGHNER is a 65 y.o. female presenting with right foot wounds which have been present for roughly 1.5 months.  No sign of infection, however the wound bed is no longer dry as there is significant edema and moisture from being in the dependent position.  No signs of infection, however I am concerned that this may occur in the coming weeks should the edema not be controlled.  She does not have an ABI documented by our office however her nursing home states she has had an ABI in the past being 0.3 and 0.8 on the right and left respectively.    I reviewed the patient's CT angiogram which demonstrates multilevel occlusive disease.  Patient would need open bypass surgery to heal the wounds on the right foot. I called [facility] and discussed her clinical condition with physical therapy.  She remains a heavy assist, barely able to stand.  She is not ambulating.  She continues to require help with ADLs, including help with eating.  Lindsey Leon is not a revascularization candidate at this time.   I had a long discussion with Lindsey Leon regarding the above, including how a major surgery to improve her blood flow could heal her wounds, but would not improve her ambulation status post stroke.  Lindsey Leon would be best served with continued aggressive wound care, with future above-knee amputation should infection develop.  There was upset regarding her recent stroke and her decline, but was okay with the above plan.  At her last visit, we discussed her cerebrovascular disease, including her left-sided stenosis with known right-sided occlusion.  I called Dr. Pearlean Brownie regarding her lacunar stroke.  The etiology is presumed to be small vessel disease.  Due to her current clinical status, we both agreed that she would be best suited for continued medical management rather than cerebrovascular  revascularization - even if her percent stenosis became greater than 50%.  I personally measured her left-sided ICA stenosis from CT angio head neck on 07/22/2021 using NASCET criteria and found the stenosis to be less than 50%.  Recommend continuing aggressive wound care.  Should infection occur, I would only offer above-knee amputation.  I will see the pt is one month for wound check.  Victorino Sparrow, MD [facility] [phone]

## 2021-10-14 ENCOUNTER — Ambulatory Visit (INDEPENDENT_AMBULATORY_CARE_PROVIDER_SITE_OTHER): Payer: Medicare Other | Admitting: Vascular Surgery

## 2021-10-14 ENCOUNTER — Other Ambulatory Visit: Payer: Self-pay

## 2021-10-14 ENCOUNTER — Encounter: Payer: Self-pay | Admitting: Vascular Surgery

## 2021-10-14 VITALS — BP 125/70 | HR 73 | Temp 98.2°F | Resp 20 | Ht 66.0 in | Wt 169.0 lb

## 2021-10-14 DIAGNOSIS — I70221 Atherosclerosis of native arteries of extremities with rest pain, right leg: Secondary | ICD-10-CM | POA: Diagnosis not present

## 2021-11-16 NOTE — Progress Notes (Deleted)
Office Note    HPI: Lindsey Leon is a 66 y.o. (May 19, 1956) female presenting in follow-up with right lower extremity wounds.  In brief, Patient has a history of severe atherosclerotic disease affecting multiple vascular beds.  From a cerebrovascular standpoint she has an occluded right common carotid artery.  There is disease on the left side as well, with history of stroke in October of this year.  Patient is wheelchair-bound, total care and assist.  She is incontinent and wears diapers at baseline with significant cognitive decline.  At her last visit, we discussed her cerebrovascular disease as well as left lower extremity disease.  At that time, I did not offer intervention, due to the patient's current clinical status.  I told her that I would contact her nursing home to further review her physical therapy regimen as well as assess her improvement versus decline.  Patient presents today *** accompanied by her aide.  She has been doing well over the last 2 weeks, denies fevers, chills.  Unfortunately Lindsey Leon has pain with leg elevation, so sits in the dependent position for most of the day.  This is led to significant edema and bulla on the dorsal aspect of her right forefoot as well as toes.  The pt is  on a statin for cholesterol management.  The pt is not on a daily aspirin.   Other AC:  - The pt is  on medication for hypertension.   The pt is  diabetic.  Tobacco hx:  former  Past Medical History:  Diagnosis Date   Chronic hepatitis C ([place])    dx'ed in 12/2018   Dementia [facility])    Diverticulitis    HIV infection ([place])    dx'ed in 12/2018   Hypertension    not on medications   Pancreatitis    Stroke [facility])     Past Surgical History:  Procedure Laterality Date   FLEXIBLE SIGMOIDOSCOPY N/A 02/26/2013   Procedure: FLEXIBLE SIGMOIDOSCOPY;  Surgeon: Beryle Beams, MD;  Location: [facility];  Service: Endoscopy;  Laterality: N/A;    Social History   Socioeconomic History    Marital status: Single    Spouse name: Not on file   Number of children: 1   Years of education: 12   Highest education level: Not on file  Occupational History   Not on file  Tobacco Use   Smoking status: Former    Packs/day: 0.50    Years: 30.00    Pack years: 15.00    Types: Cigarettes    Quit date: 05/22/2015    Years since quitting: 6.4   Smokeless tobacco: Never  Vaping Use   Vaping Use: Never used  Substance and Sexual Activity   Alcohol use: No    Alcohol/week: 0.0 standard drinks   Drug use: Not Currently    Types: Marijuana   Sexual activity: Never  Other Topics Concern   Not on file  Social History Narrative   Lives att [place] facility    Patient is right handed.   Drinks no caffeine daily   Social Determinants of Radio broadcast assistant Strain: Not on file  Food Insecurity: Not on file  Transportation Needs: Not on file  Physical Activity: Not on file  Stress: Not on file  Social Connections: Not on file  Intimate Partner Violence: Not on file    Family History  Problem Relation Age of Onset   Healthy Father    Cancer Mother  passed away. unknown age   Hypertension Sister    Diabetes Mellitus II Sister     Current Outpatient Medications  Medication Sig Dispense Refill   acetaminophen (TYLENOL) 325 MG tablet Take 650 mg by mouth every 12 (twelve) hours as needed (pain).     acetaminophen-codeine (TYLENOL #3) 300-30 MG tablet Take 1 tablet by mouth See admin instructions. Qd x 2 months     albuterol (VENTOLIN HFA) 108 (90 Base) MCG/ACT inhaler Inhale 2 puffs into the lungs every 6 (six) hours as needed for shortness of breath.     atorvastatin (LIPITOR) 10 MG tablet Take 10 mg by mouth every morning.     B Complex Vitamins (VITAMIN B COMPLEX) TABS Take 1 tablet by mouth every morning.     BIKTARVY 50-200-25 MG TABS tablet TAKE 1 TABLET BY MOUTH EVERY DAY (Patient taking differently: Take 1 tablet by mouth in the morning.) 30 tablet 5    chlorthalidone (HYGROTON) 25 MG tablet Take 25 mg by mouth every morning.     chlorthalidone (HYGROTON) 25 MG tablet Take 12.5 mg by mouth daily.     chlorthalidone (HYGROTON) 25 MG tablet Take 12.5 mg by mouth daily as needed (SBP >140).     Cholecalciferol (VITAMIN D) 50 MCG (2000 UT) tablet Take 2,000 Units by mouth every morning.     clopidogrel (PLAVIX) 75 MG tablet Take 1 tablet (75 mg total) by mouth daily. 30 tablet 11   donepezil (ARICEPT) 5 MG tablet Take 10 mg by mouth at bedtime.     famotidine (PEPCID) 20 MG tablet Take 20 mg by mouth daily before breakfast.     furosemide (LASIX) 20 MG tablet Take 20 mg by mouth daily as needed (shortness of breath).     gabapentin (NEURONTIN) 100 MG capsule Take 100 mg by mouth at bedtime.     gabapentin (NEURONTIN) 400 MG capsule Take 400 mg by mouth every morning.     gabapentin (NEURONTIN) 800 MG tablet Take 800 mg by mouth at bedtime.     insulin glargine (LANTUS) 100 UNIT/ML injection Inject 13 Units into the skin at bedtime.     Lidocaine-Menthol (ICY HOT MAX) 4-1 % AERO Apply 1 application topically daily as needed (pain).     losartan (COZAAR) 100 MG tablet Take 100 mg by mouth daily at 12 noon.     Menthol, Topical Analgesic, 5 % GEL Apply 1 application topically every 12 (twelve) hours as needed (right foot and heel pain).     Omega-3 Fatty Acids (FISH OIL) 1000 MG CAPS Take 1,000 mg by mouth every morning.     OXYGEN Inhale 2 L/min into the lungs as needed (for sats less than 92%).     oxymetazoline (AFRIN) 0.05 % nasal spray Place 2 sprays into both nostrils 2 (two) times daily as needed (uncontrollable nose bleeds).     polyethylene glycol powder (GLYCOLAX/MIRALAX) 17 GM/SCOOP powder Take 17 g by mouth daily as needed (constipation).     potassium chloride SA (KLOR-CON) 20 MEQ tablet Take 20 mEq by mouth every Monday, Wednesday, and Friday.     sennosides-docusate sodium (SENOKOT-S) 8.6-50 MG tablet Take 2 tablets by mouth at bedtime.      TOPROL XL 50 MG 24 hr tablet Take 50 mg by mouth in the morning. Take with or immediately following a meal.     TUMS ULTRA 1000 1000 MG chewable tablet Chew 2,000 mg by mouth daily as needed (for indigestion).  No current facility-administered medications for this visit.    No Known Allergies   REVIEW OF SYSTEMS:   [X]  denotes positive finding, [ ]  denotes negative finding Cardiac  Comments:  Chest pain or chest pressure:    Shortness of breath upon exertion:    Short of breath when lying flat:    Irregular heart rhythm:        Vascular    Pain in calf, thigh, or hip brought on by ambulation:    Pain in feet at night that wakes you up from your sleep:     Blood clot in your veins:    Leg swelling:         Pulmonary    Oxygen at home:    Productive cough:     Wheezing:         Neurologic    Sudden weakness in arms or legs:     Sudden numbness in arms or legs:     Sudden onset of difficulty speaking or slurred speech:    Temporary loss of vision in one eye:     Problems with dizziness:         Gastrointestinal    Blood in stool:     Vomited blood:         Genitourinary    Burning when urinating:     Blood in urine:        Psychiatric    Major depression:         Hematologic    Bleeding problems:    Problems with blood clotting too easily:        Skin    Rashes or ulcers:        Constitutional    Fever or chills:      PHYSICAL EXAMINATION:  There were no vitals filed for this visit.  General:  WDWN in NAD; vital signs documented above Gait: Not observed HENT: WNL, normocephalic Pulmonary: normal non-labored breathing , without wheezing Cardiac: regular HR, Abdomen: soft, NT, no masses Skin: without rashes Vascular Exam/Pulses:  Right Left  Radial 2+ (normal) 2+ (normal)  Ulnar 2+ (normal) 2+ (normal)  Femoral 1+ (weak) 1+ (weak)  Popliteal    DP absent absent  PT absent absent   Extremities: without ischemic changes, without Gangrene ,  without cellulitis; with open wounds - lateral aspect of the right leg, likley from edema, toe blister from edema Right sided hemiplegia Musculoskeletal: no muscle wasting or atrophy  Neurologic: A&O X 3;  No focal weakness or paresthesias are detected Psychiatric:  The pt has  a depressed  affect.   Non-Invasive Vascular Imaging:       RIGHT Lower Extremity   Inflow: Iliac artery demonstrates diffuse atherosclerotic calcifications. No focal significant stenosis is identified. The common femoral artery is patent.   Runoff: Common femoral bifurcation demonstrates moderate narrowing at the origin of the superficial femoral artery with associated multifocal moderate to severe areas of narrowing throughout the right thigh. High-grade stenosis in the proximal popliteal artery is noted as well. Heavy calcific disease is seen within the popliteal artery. The distal popliteal artery is occluded and there is reconstitution of the infrapopliteal vessels identified. Peroneal artery is diminutive. The anterior tibial artery is visualized into the mid to distal calf although it is more distal component is not visualized and may be related to occlusion. Posterior tibial artery continues into the foot.   LEFT Lower Extremity   Inflow: Iliac arteries demonstrate atherosclerotic calcification without focal hemodynamically  significant stenosis. Common femoral artery demonstrates moderate stenosis.   Runoff: Occlusion of the superficial femoral artery on the left is noted just beyond its origin. Reconstitution just above the adductor canal is noted for a short segment although occlusion extending into the proximal popliteal artery is noted. Reconstitution of the popliteal artery is noted with multiple tandem areas of stenosis identified. The distal popliteal artery is occluded similar to that seen on the right. Reconstitution just above the popliteal trifurcation is noted. Three-vessel runoff  to the mid to distal left calf is noted with the posterior tibial artery continuing into foot.  ABI: 0.3 / 0.8    ASSESSMENT/PLAN: NADALIE LAUGHNER is a 66 y.o. female presenting with right foot wounds which have been present for roughly 1.5 months.  No sign of infection, however the wound bed is no longer dry as there is significant edema and moisture from being in the dependent position.  No signs of infection, however I am concerned that this may occur in the coming weeks should the edema not be controlled.  She does not have an ABI documented by our office however her nursing home states she has had an ABI in the past being 0.3 and 0.8 on the right and left respectively.    I reviewed the patient's CT angiogram which demonstrates multilevel occlusive disease.  Patient would need open bypass surgery to heal the wounds on the right foot. I called [facility] and discussed her clinical condition with physical therapy.  She remains a heavy assist, barely able to stand.  She is not ambulating.  She continues to require help with ADLs, including help with eating.  Lindsey Leon is not a revascularization candidate at this time.   I had a long discussion with Lindsey Leon regarding the above, including how a major surgery to improve her blood flow could heal her wounds, but would not improve her ambulation status post stroke.  Lindsey Leon would be best served with continued aggressive wound care, with future above-knee amputation should infection develop.  There was upset regarding her recent stroke and her decline, but was okay with the above plan.  At her last visit, we discussed her cerebrovascular disease, including her left-sided stenosis with known right-sided occlusion.  I called Dr. Pearlean Brownie regarding her lacunar stroke.  The etiology is presumed to be small vessel disease.  Due to her current clinical status, we both agreed that she would be best suited for continued medical management rather than cerebrovascular  revascularization - even if her percent stenosis became greater than 50%.  I personally measured her left-sided ICA stenosis from CT angio head neck on 07/22/2021 using NASCET criteria and found the stenosis to be less than 50%.  Recommend continuing aggressive wound care.  Should infection occur, I would only offer above-knee amputation.  I will see the pt is one month for wound check.  Victorino Sparrow, MD [facility] [phone]

## 2021-11-18 ENCOUNTER — Ambulatory Visit: Payer: Medicare Other | Admitting: Vascular Surgery

## 2021-11-30 IMAGING — DX DG CHEST 1V PORT
1 series · 1 of 1 positions shown · non-contrast
Comparison: 05/24/2014

CLINICAL DATA: Pain.

EXAM:
PORTABLE CHEST 1 VIEW

[chest ap]
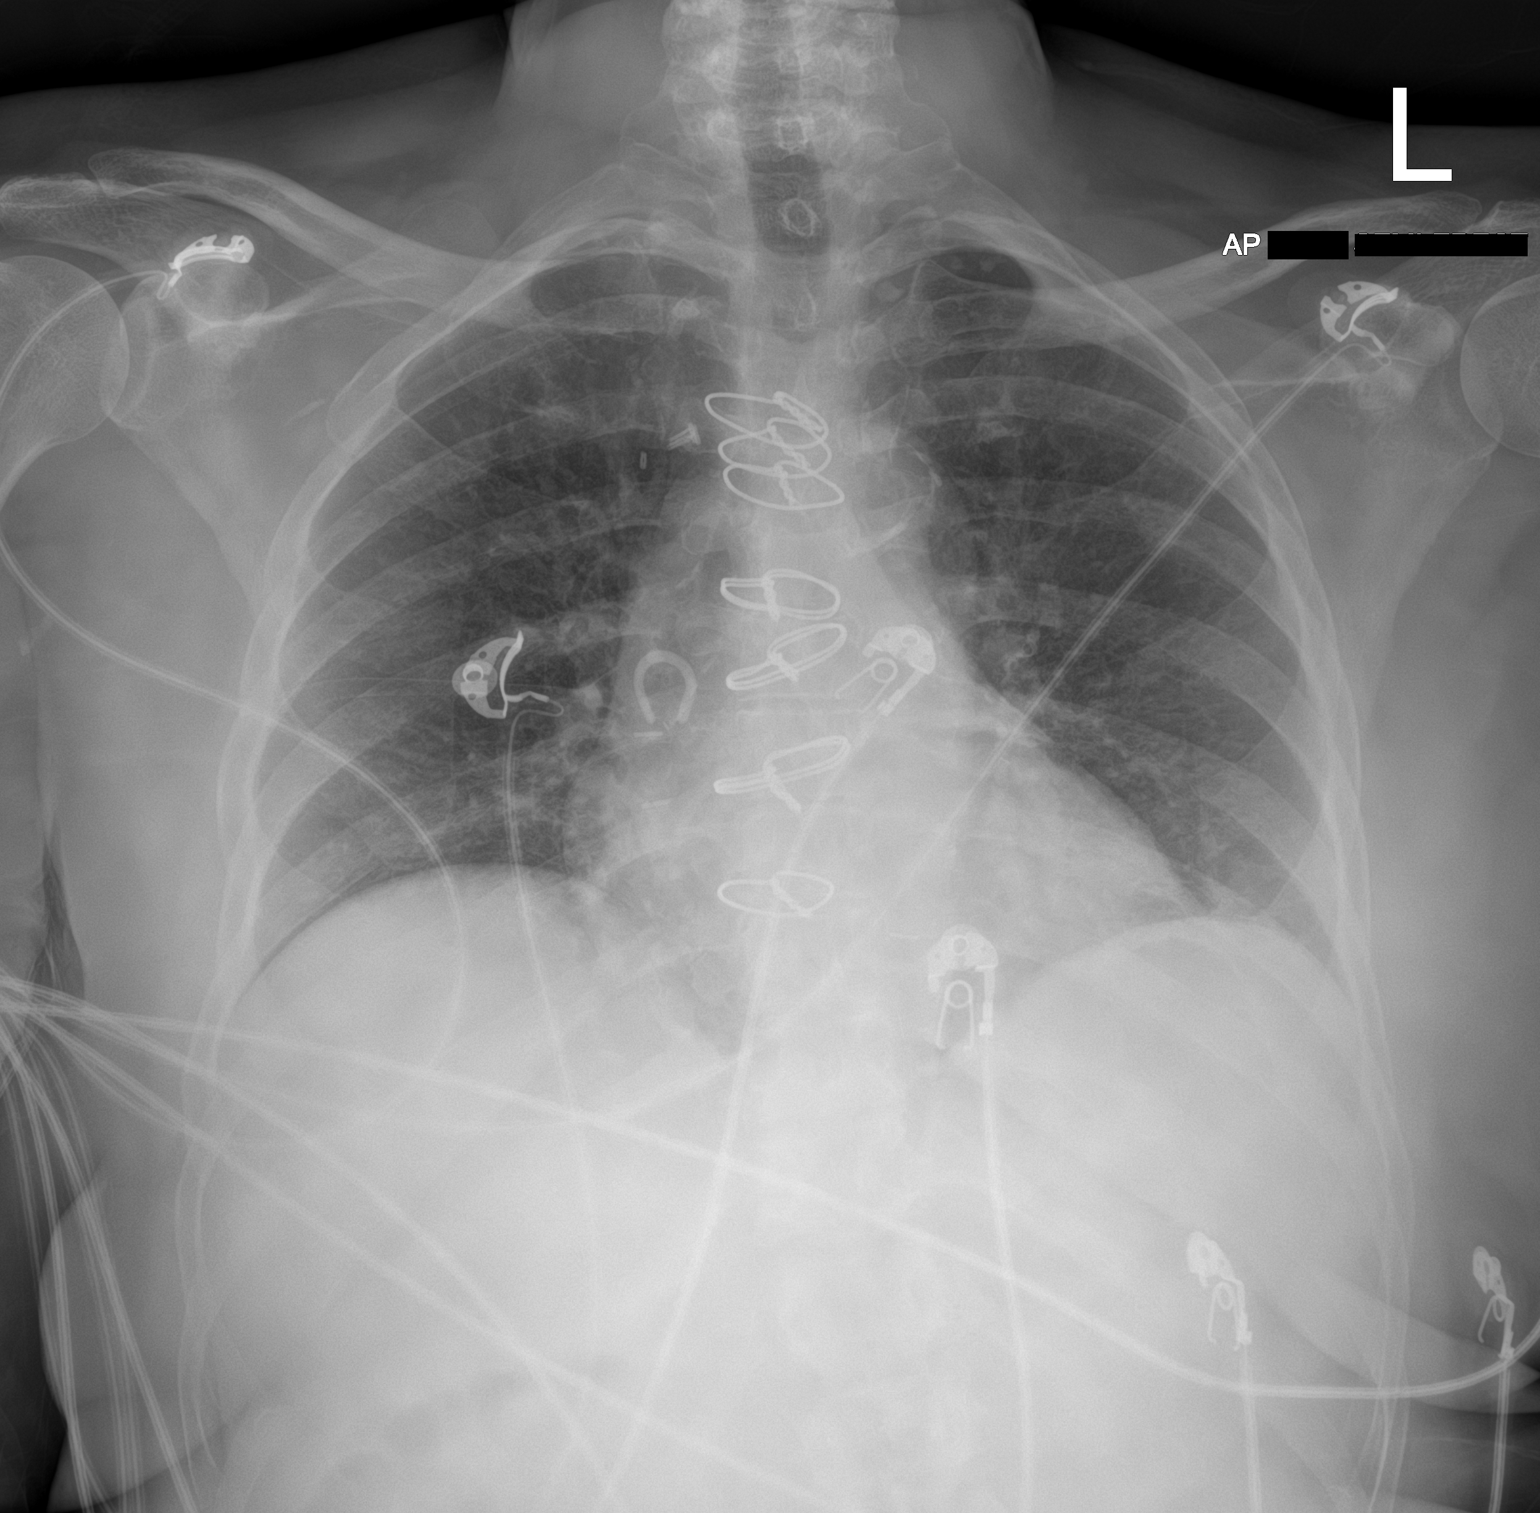

[1 of 1 positions shown; findings below may reference images not displayed]

FINDINGS: Lung volumes are low. Post median sternotomy. Heart is normal in
size. Aortic atherosclerosis. Minimal streaky retrocardiac opacity.
No confluent consolidation. No pulmonary edema, large pleural
effusion or pneumothorax. No acute osseous abnormalities are seen.
IMPRESSION: Low lung volumes with minimal streaky retrocardiac opacity, favor
atelectasis.

## 2021-12-28 ENCOUNTER — Ambulatory Visit: Payer: Medicaid Other | Admitting: Internal Medicine

## 2022-06-08 IMAGING — DX DG CHEST 1V PORT
1 series · 1 of 1 positions shown · non-contrast
Comparison: Portable exam at 8001 hrs compared to 02/11/2021

CLINICAL DATA: Weakness

EXAM:
PORTABLE CHEST 1 VIEW

[chest ap]
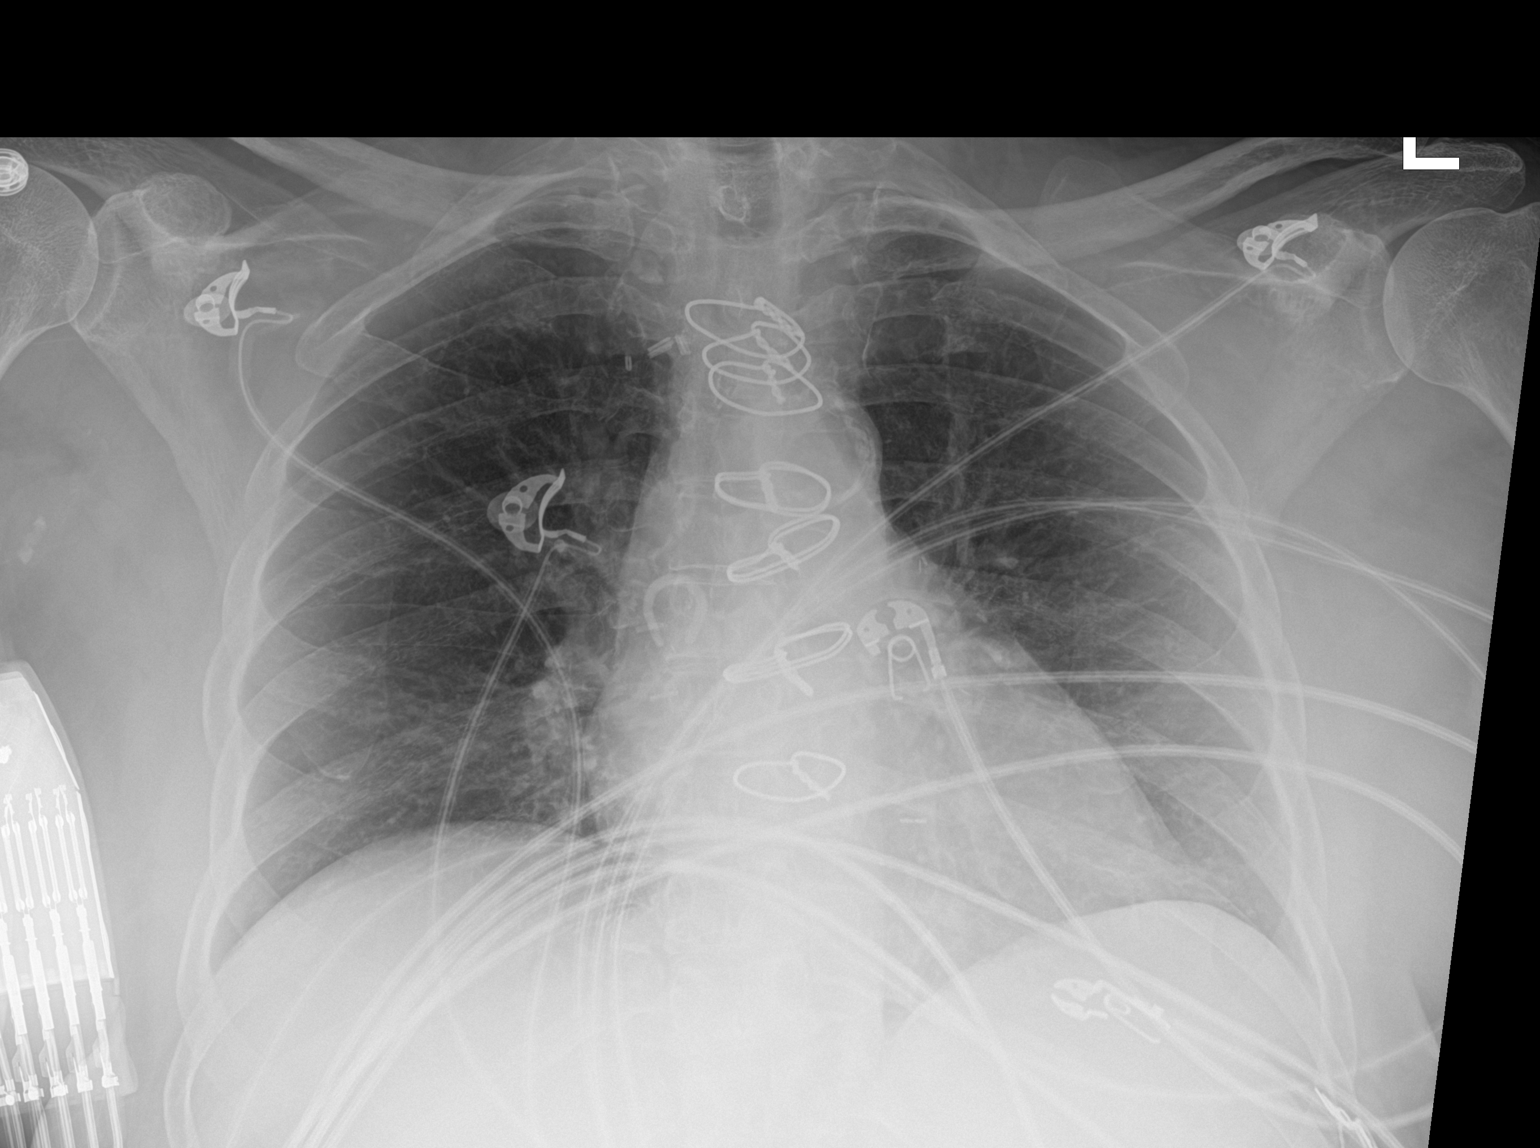

[1 of 1 positions shown; findings below may reference images not displayed]

FINDINGS: Normal heart size post median sternotomy and CABG.

Atherosclerotic calcification aorta.

Mediastinal contours and pulmonary vascularity normal.

Lungs clear.

No infiltrate, pleural effusion, or pneumothorax.

Bones demineralized.
IMPRESSION: Post CABG.

No acute abnormalities.

Aortic Atherosclerosis ([id number]).
